# Patient Record
Sex: Male | Born: 1935
Health system: Southern US, Community
[De-identification: ages and names within clinical notes are randomized; demographics above are authoritative.]

## PROBLEM LIST (undated history)

## (undated) DIAGNOSIS — R7301 Impaired fasting glucose: Secondary | ICD-10-CM

## (undated) DIAGNOSIS — C4490 Unspecified malignant neoplasm of skin, unspecified: Secondary | ICD-10-CM

## (undated) DIAGNOSIS — N3010 Interstitial cystitis (chronic) without hematuria: Secondary | ICD-10-CM

## (undated) DIAGNOSIS — K449 Diaphragmatic hernia without obstruction or gangrene: Secondary | ICD-10-CM

## (undated) DIAGNOSIS — N200 Calculus of kidney: Secondary | ICD-10-CM

## (undated) DIAGNOSIS — K635 Polyp of colon: Secondary | ICD-10-CM

## (undated) DIAGNOSIS — N400 Enlarged prostate without lower urinary tract symptoms: Secondary | ICD-10-CM

## (undated) DIAGNOSIS — E785 Hyperlipidemia, unspecified: Secondary | ICD-10-CM

## (undated) HISTORY — PX: SEPTOPLASTY: SUR1290

## (undated) HISTORY — DX: Impaired fasting glucose: R73.01

## (undated) HISTORY — DX: Polyp of colon: K63.5

## (undated) HISTORY — DX: Calculus of kidney: N20.0

## (undated) HISTORY — PX: COLONOSCOPY W/ POLYPECTOMY: SHX1380

## (undated) HISTORY — PX: CYSTOSCOPY: SUR368

## (undated) HISTORY — PX: TONSILLECTOMY: SUR1361

## (undated) HISTORY — DX: Interstitial cystitis (chronic) without hematuria: N30.10

## (undated) HISTORY — DX: Unspecified malignant neoplasm of skin, unspecified: C44.90

## (undated) HISTORY — DX: Diaphragmatic hernia without obstruction or gangrene: K44.9

## (undated) HISTORY — DX: Hyperlipidemia, unspecified: E78.5

## (undated) HISTORY — DX: Benign prostatic hyperplasia without lower urinary tract symptoms: N40.0

---

## 2001-12-16 ENCOUNTER — Ambulatory Visit (HOSPITAL_COMMUNITY): Admission: RE | Admit: 2001-12-16 | Discharge: 2001-12-16 | Payer: Self-pay | Admitting: Gastroenterology

## 2002-09-17 ENCOUNTER — Encounter: Payer: Self-pay | Admitting: Internal Medicine

## 2002-09-17 ENCOUNTER — Encounter: Admission: RE | Admit: 2002-09-17 | Discharge: 2002-09-17 | Payer: Self-pay | Admitting: Internal Medicine

## 2003-08-19 ENCOUNTER — Encounter (INDEPENDENT_AMBULATORY_CARE_PROVIDER_SITE_OTHER): Payer: Self-pay | Admitting: Specialist

## 2003-08-19 ENCOUNTER — Ambulatory Visit (HOSPITAL_COMMUNITY): Admission: RE | Admit: 2003-08-19 | Discharge: 2003-08-19 | Payer: Self-pay | Admitting: Urology

## 2003-08-19 ENCOUNTER — Ambulatory Visit (HOSPITAL_BASED_OUTPATIENT_CLINIC_OR_DEPARTMENT_OTHER): Admission: RE | Admit: 2003-08-19 | Discharge: 2003-08-19 | Payer: Self-pay | Admitting: Urology

## 2004-09-21 ENCOUNTER — Ambulatory Visit: Payer: Self-pay | Admitting: Internal Medicine

## 2004-09-26 ENCOUNTER — Ambulatory Visit: Payer: Self-pay | Admitting: Internal Medicine

## 2005-09-26 ENCOUNTER — Ambulatory Visit: Payer: Self-pay | Admitting: Internal Medicine

## 2005-09-30 ENCOUNTER — Ambulatory Visit: Payer: Self-pay | Admitting: Internal Medicine

## 2006-08-26 ENCOUNTER — Ambulatory Visit: Payer: Self-pay | Admitting: Internal Medicine

## 2006-11-26 ENCOUNTER — Ambulatory Visit: Payer: Self-pay | Admitting: Internal Medicine

## 2006-12-02 ENCOUNTER — Ambulatory Visit: Payer: Self-pay | Admitting: Internal Medicine

## 2007-01-26 ENCOUNTER — Ambulatory Visit: Payer: Self-pay | Admitting: Internal Medicine

## 2007-04-02 ENCOUNTER — Emergency Department (HOSPITAL_COMMUNITY): Admission: EM | Admit: 2007-04-02 | Discharge: 2007-04-02 | Payer: Self-pay | Admitting: Emergency Medicine

## 2007-04-07 ENCOUNTER — Encounter: Payer: Self-pay | Admitting: Internal Medicine

## 2007-04-07 ENCOUNTER — Ambulatory Visit: Payer: Self-pay | Admitting: Internal Medicine

## 2007-04-07 LAB — CONVERTED CEMR LAB
Free T4: 0.7 ng/dL (ref 0.6–1.6)
Magnesium: 2.5 mg/dL (ref 1.5–2.5)
T3, Free: 2.9 pg/mL (ref 2.3–4.2)
TSH: 1.18 microintl units/mL (ref 0.35–5.50)

## 2007-04-08 ENCOUNTER — Encounter: Payer: Self-pay | Admitting: Internal Medicine

## 2007-04-13 ENCOUNTER — Telehealth: Payer: Self-pay | Admitting: Internal Medicine

## 2007-05-06 ENCOUNTER — Ambulatory Visit: Payer: Self-pay | Admitting: Internal Medicine

## 2007-05-20 ENCOUNTER — Telehealth (INDEPENDENT_AMBULATORY_CARE_PROVIDER_SITE_OTHER): Payer: Self-pay | Admitting: *Deleted

## 2007-05-25 ENCOUNTER — Encounter (INDEPENDENT_AMBULATORY_CARE_PROVIDER_SITE_OTHER): Payer: Self-pay | Admitting: *Deleted

## 2007-06-03 ENCOUNTER — Ambulatory Visit: Payer: Self-pay | Admitting: Internal Medicine

## 2007-06-05 LAB — CONVERTED CEMR LAB
Calcium: 9.9 mg/dL (ref 8.4–10.5)
Potassium: 4.6 meq/L (ref 3.5–5.1)

## 2007-06-08 ENCOUNTER — Encounter (INDEPENDENT_AMBULATORY_CARE_PROVIDER_SITE_OTHER): Payer: Self-pay | Admitting: *Deleted

## 2007-06-19 ENCOUNTER — Ambulatory Visit: Payer: Self-pay | Admitting: Cardiology

## 2007-06-22 ENCOUNTER — Encounter: Payer: Self-pay | Admitting: Internal Medicine

## 2007-06-23 ENCOUNTER — Encounter: Payer: Self-pay | Admitting: Cardiology

## 2007-06-23 ENCOUNTER — Ambulatory Visit: Payer: Self-pay

## 2007-08-04 ENCOUNTER — Encounter (INDEPENDENT_AMBULATORY_CARE_PROVIDER_SITE_OTHER): Payer: Self-pay | Admitting: Urology

## 2007-08-04 ENCOUNTER — Ambulatory Visit (HOSPITAL_BASED_OUTPATIENT_CLINIC_OR_DEPARTMENT_OTHER): Admission: RE | Admit: 2007-08-04 | Discharge: 2007-08-05 | Payer: Self-pay | Admitting: Urology

## 2007-10-13 ENCOUNTER — Encounter: Payer: Self-pay | Admitting: Internal Medicine

## 2007-10-14 ENCOUNTER — Encounter: Payer: Self-pay | Admitting: Internal Medicine

## 2007-10-22 ENCOUNTER — Encounter: Payer: Self-pay | Admitting: Internal Medicine

## 2007-12-07 ENCOUNTER — Encounter: Payer: Self-pay | Admitting: Internal Medicine

## 2008-01-04 ENCOUNTER — Telehealth: Payer: Self-pay | Admitting: Internal Medicine

## 2008-02-01 ENCOUNTER — Encounter: Payer: Self-pay | Admitting: Internal Medicine

## 2008-02-16 ENCOUNTER — Encounter: Payer: Self-pay | Admitting: Internal Medicine

## 2008-02-18 ENCOUNTER — Ambulatory Visit: Payer: Self-pay | Admitting: Internal Medicine

## 2008-02-18 DIAGNOSIS — R930 Abnormal findings on diagnostic imaging of skull and head, not elsewhere classified: Secondary | ICD-10-CM | POA: Insufficient documentation

## 2008-02-18 DIAGNOSIS — K219 Gastro-esophageal reflux disease without esophagitis: Secondary | ICD-10-CM | POA: Insufficient documentation

## 2008-02-18 DIAGNOSIS — E785 Hyperlipidemia, unspecified: Secondary | ICD-10-CM | POA: Insufficient documentation

## 2008-02-18 DIAGNOSIS — N3010 Interstitial cystitis (chronic) without hematuria: Secondary | ICD-10-CM | POA: Insufficient documentation

## 2008-02-22 ENCOUNTER — Encounter (INDEPENDENT_AMBULATORY_CARE_PROVIDER_SITE_OTHER): Payer: Self-pay | Admitting: *Deleted

## 2008-02-24 ENCOUNTER — Encounter: Payer: Self-pay | Admitting: Internal Medicine

## 2008-05-12 ENCOUNTER — Ambulatory Visit: Payer: Self-pay | Admitting: Internal Medicine

## 2008-05-16 ENCOUNTER — Encounter (INDEPENDENT_AMBULATORY_CARE_PROVIDER_SITE_OTHER): Payer: Self-pay | Admitting: *Deleted

## 2008-06-10 ENCOUNTER — Ambulatory Visit (HOSPITAL_COMMUNITY): Admission: RE | Admit: 2008-06-10 | Discharge: 2008-06-10 | Payer: Self-pay | Admitting: Gastroenterology

## 2008-07-04 ENCOUNTER — Encounter: Payer: Self-pay | Admitting: Internal Medicine

## 2008-07-28 ENCOUNTER — Encounter: Payer: Self-pay | Admitting: Internal Medicine

## 2008-08-04 ENCOUNTER — Ambulatory Visit: Payer: Self-pay | Admitting: Internal Medicine

## 2008-08-22 ENCOUNTER — Encounter: Payer: Self-pay | Admitting: Internal Medicine

## 2008-12-05 ENCOUNTER — Ambulatory Visit: Payer: Self-pay | Admitting: Internal Medicine

## 2008-12-05 DIAGNOSIS — K449 Diaphragmatic hernia without obstruction or gangrene: Secondary | ICD-10-CM | POA: Insufficient documentation

## 2008-12-05 DIAGNOSIS — Z86010 Personal history of colonic polyps: Secondary | ICD-10-CM

## 2008-12-06 ENCOUNTER — Encounter: Payer: Self-pay | Admitting: Internal Medicine

## 2008-12-26 ENCOUNTER — Telehealth (INDEPENDENT_AMBULATORY_CARE_PROVIDER_SITE_OTHER): Payer: Self-pay | Admitting: *Deleted

## 2009-03-24 ENCOUNTER — Ambulatory Visit: Payer: Self-pay | Admitting: Internal Medicine

## 2009-04-03 ENCOUNTER — Telehealth (INDEPENDENT_AMBULATORY_CARE_PROVIDER_SITE_OTHER): Payer: Self-pay | Admitting: *Deleted

## 2009-04-12 ENCOUNTER — Ambulatory Visit: Payer: Self-pay | Admitting: Internal Medicine

## 2009-04-13 ENCOUNTER — Ambulatory Visit: Payer: Self-pay | Admitting: Internal Medicine

## 2009-04-14 ENCOUNTER — Encounter (INDEPENDENT_AMBULATORY_CARE_PROVIDER_SITE_OTHER): Payer: Self-pay | Admitting: *Deleted

## 2009-09-15 ENCOUNTER — Ambulatory Visit: Payer: Self-pay | Admitting: Internal Medicine

## 2009-09-15 DIAGNOSIS — G252 Other specified forms of tremor: Secondary | ICD-10-CM

## 2009-09-15 DIAGNOSIS — G250 Essential tremor: Secondary | ICD-10-CM | POA: Insufficient documentation

## 2009-09-15 DIAGNOSIS — N400 Enlarged prostate without lower urinary tract symptoms: Secondary | ICD-10-CM

## 2009-09-18 ENCOUNTER — Encounter (INDEPENDENT_AMBULATORY_CARE_PROVIDER_SITE_OTHER): Payer: Self-pay | Admitting: *Deleted

## 2009-09-26 ENCOUNTER — Encounter (INDEPENDENT_AMBULATORY_CARE_PROVIDER_SITE_OTHER): Payer: Self-pay | Admitting: *Deleted

## 2009-09-26 ENCOUNTER — Ambulatory Visit: Payer: Self-pay | Admitting: Internal Medicine

## 2009-09-26 LAB — CONVERTED CEMR LAB: OCCULT 3: NEGATIVE

## 2009-11-11 DIAGNOSIS — K635 Polyp of colon: Secondary | ICD-10-CM

## 2009-11-11 HISTORY — DX: Polyp of colon: K63.5

## 2010-10-11 ENCOUNTER — Encounter: Payer: Self-pay | Admitting: Internal Medicine

## 2010-10-11 ENCOUNTER — Ambulatory Visit: Payer: Self-pay | Admitting: Internal Medicine

## 2010-10-11 DIAGNOSIS — R7303 Prediabetes: Secondary | ICD-10-CM

## 2010-10-11 DIAGNOSIS — Z85828 Personal history of other malignant neoplasm of skin: Secondary | ICD-10-CM

## 2010-10-14 DIAGNOSIS — Z87442 Personal history of urinary calculi: Secondary | ICD-10-CM

## 2010-10-15 LAB — CONVERTED CEMR LAB
BUN: 17 mg/dL (ref 6–23)
Basophils Absolute: 0 10*3/uL (ref 0.0–0.1)
Chloride: 100 meq/L (ref 96–112)
Cholesterol: 182 mg/dL (ref 0–200)
Creatinine, Ser: 1.1 mg/dL (ref 0.4–1.5)
Eosinophils Absolute: 0.1 10*3/uL (ref 0.0–0.7)
GFR calc non Af Amer: 71.72 mL/min (ref >60)
Glucose, Bld: 106 mg/dL — ABNORMAL HIGH (ref 70–99)
HCT: 47.8 % (ref 39.0–52.0)
Hgb A1c MFr Bld: 5.9 % (ref 4.6–6.5)
Lymphs Abs: 3.1 10*3/uL (ref 0.7–4.0)
MCV: 90 fL (ref 78.0–100.0)
Monocytes Absolute: 0.7 10*3/uL (ref 0.1–1.0)
Platelets: 197 10*3/uL (ref 150.0–400.0)
Potassium: 5.2 meq/L — ABNORMAL HIGH (ref 3.5–5.1)
RDW: 13.4 % (ref 11.5–14.6)
TSH: 0.92 microintl units/mL (ref 0.35–5.50)
Total Bilirubin: 1 mg/dL (ref 0.3–1.2)
Triglycerides: 101 mg/dL (ref 0.0–149.0)
VLDL: 20.2 mg/dL (ref 0.0–40.0)

## 2010-11-02 ENCOUNTER — Ambulatory Visit: Payer: Self-pay | Admitting: Internal Medicine

## 2010-11-02 DIAGNOSIS — R1032 Left lower quadrant pain: Secondary | ICD-10-CM

## 2010-11-02 DIAGNOSIS — K5900 Constipation, unspecified: Secondary | ICD-10-CM | POA: Insufficient documentation

## 2010-11-06 LAB — CONVERTED CEMR LAB
Basophils Absolute: 0.1 10*3/uL (ref 0.0–0.1)
Eosinophils Absolute: 0.1 10*3/uL (ref 0.0–0.7)
Hemoglobin: 16.4 g/dL (ref 13.0–17.0)
Lymphocytes Relative: 36.8 % (ref 12.0–46.0)
Lymphs Abs: 3.6 10*3/uL (ref 0.7–4.0)
MCHC: 34.4 g/dL (ref 30.0–36.0)
Neutro Abs: 5.3 10*3/uL (ref 1.4–7.7)
RDW: 12.8 % (ref 11.5–14.6)

## 2010-11-11 DIAGNOSIS — K449 Diaphragmatic hernia without obstruction or gangrene: Secondary | ICD-10-CM

## 2010-11-11 HISTORY — PX: UPPER GASTROINTESTINAL ENDOSCOPY: SHX188

## 2010-11-11 HISTORY — DX: Diaphragmatic hernia without obstruction or gangrene: K44.9

## 2010-12-09 LAB — CONVERTED CEMR LAB
ALT: 28 units/L (ref 0–53)
ALT: 29 units/L (ref 0–53)
AST: 26 units/L (ref 0–37)
Albumin: 4.7 g/dL (ref 3.5–5.2)
Alkaline Phosphatase: 64 units/L (ref 39–117)
Alkaline Phosphatase: 70 units/L (ref 39–117)
BUN: 16 mg/dL (ref 6–23)
BUN: 16 mg/dL (ref 6–23)
Basophils Absolute: 0 10*3/uL (ref 0.0–0.1)
Basophils Relative: 0 % (ref 0.0–3.0)
Basophils Relative: 0.1 % (ref 0.0–1.0)
Bilirubin, Direct: 0.1 mg/dL (ref 0.0–0.3)
Bilirubin, Direct: 0.2 mg/dL (ref 0.0–0.3)
CO2: 32 meq/L (ref 19–32)
Calcium: 10 mg/dL (ref 8.4–10.5)
Calcium: 9.7 mg/dL (ref 8.4–10.5)
Chloride: 104 meq/L (ref 96–112)
Cholesterol: 146 mg/dL (ref 0–200)
Cholesterol: 173 mg/dL (ref 0–200)
Creatinine, Ser: 0.9 mg/dL (ref 0.4–1.5)
Creatinine, Ser: 1.1 mg/dL (ref 0.4–1.5)
Eosinophils Absolute: 0.1 10*3/uL (ref 0.0–0.7)
Eosinophils Relative: 1 % (ref 0.0–5.0)
Eosinophils Relative: 1.3 % (ref 0.0–5.0)
GFR calc Af Amer: 107 mL/min
GFR calc non Af Amer: 69.67 mL/min (ref >60)
GFR calc non Af Amer: 88 mL/min
Glucose, Bld: 99 mg/dL (ref 70–99)
HCT: 52.7 % — ABNORMAL HIGH (ref 39.0–52.0)
HDL: 39 mg/dL (ref >39.0)
HDL: 40.1 mg/dL (ref >39.00)
Hemoglobin: 17.1 g/dL — ABNORMAL HIGH (ref 13.0–17.0)
Hgb A1c MFr Bld: 5.7 % (ref 4.6–6.0)
Hgb A1c MFr Bld: 5.8 % (ref 4.6–6.5)
LDL Cholesterol: 108 mg/dL — ABNORMAL HIGH (ref 0–99)
LDL Cholesterol: 88 mg/dL (ref 0–99)
Lymphocytes Relative: 37.5 % (ref 12.0–46.0)
Lymphocytes Relative: 37.9 % (ref 12.0–46.0)
MCHC: 32.5 g/dL (ref 30.0–36.0)
MCV: 88.3 fL (ref 78.0–100.0)
MCV: 91.7 fL (ref 78.0–100.0)
Monocytes Absolute: 0.5 10*3/uL (ref 0.1–1.0)
Monocytes Absolute: 0.6 10*3/uL (ref 0.1–1.0)
Monocytes Relative: 7.2 % (ref 3.0–12.0)
Monocytes Relative: 7.9 % (ref 3.0–12.0)
Neutro Abs: 4.4 10*3/uL (ref 1.4–7.7)
Neutrophils Relative %: 53.5 % (ref 43.0–77.0)
Neutrophils Relative %: 53.6 % (ref 43.0–77.0)
Platelets: 183 10*3/uL (ref 150.0–400.0)
Platelets: 220 10*3/uL (ref 150–400)
Potassium: 5 meq/L (ref 3.5–5.1)
RBC: 5.21 M/uL (ref 4.22–5.81)
RBC: 5.96 M/uL — ABNORMAL HIGH (ref 4.22–5.81)
RDW: 12.8 % (ref 11.5–14.6)
Sodium: 143 meq/L (ref 135–145)
TSH: 0.93 microintl units/mL (ref 0.35–5.50)
Total Bilirubin: 1.1 mg/dL (ref 0.3–1.2)
Total Bilirubin: 1.2 mg/dL (ref 0.3–1.2)
Total CHOL/HDL Ratio: 3.7
Total CHOL/HDL Ratio: 4
Total Protein: 7.6 g/dL (ref 6.0–8.3)
Triglycerides: 127 mg/dL (ref 0.0–149.0)
Triglycerides: 97 mg/dL (ref 0–149)
VLDL: 19 mg/dL (ref 0–40)
VLDL: 25.4 mg/dL (ref 0.0–40.0)
WBC: 7.5 10*3/uL (ref 4.5–10.5)
WBC: 8.1 10*3/uL (ref 4.5–10.5)

## 2010-12-11 NOTE — Assessment & Plan Note (Signed)
Summary: CPX/FASTING//KN   Vital Signs:  Patient profile:   77 year old male Height:      69.5 inches Weight:      177.6 pounds BMI:     25.94 Temp:     97.7 degrees F oral Pulse rate:   52 / minute Resp:     14 per minute BP sitting:   148 / 86  (left arm) Cuff size:   large  Vitals Entered By: Shonna Chock CMA (October 11, 2010 8:27 AM) CC: CPX with fasting labs , Heartburn   CC:  CPX with fasting labs  and Heartburn.  History of Present Illness: Here for Medicare AWV: 1.Risk factors based on Past M, S, F history:GERD; Interstitial Cystitis; Diverticulosis; Allergic Rhinitis(chart updated) 2.Physical Activities: walking once daily 30-60 minutes w/o symptoms 3.Depression/mood: no issues 4.Hearing: whisper heard @ 6 ft 5.ADL's: no limitations 6.Fall Risk: none 7.Home Safety:no issues  8.Height, weight, &visual acuity:wall chart read @ 6 ft 9. POA & Living Will in placeCounseling:  10.Labs ordered based on risk factors: see Orders 11. Referral Coordination: in place 12. Care Plan: see Instructions 13.Cognitive Assessment: mood & affect normal;; memory & recall intact ; "World " spelled backwards; Oriented X 3. GERD      This is a 77 year old man who has a history of GERD.  The patient reports  occasional acid reflux and weight gain of 3-4 # (post cruise), but denies sour taste in mouth, epigastric pain, chest pain, and trouble swallowing.  The patient denies the following alarm features: melena, dysphagia, hematemesis, and vomiting.  Symptoms are worse with spicy foods and citrus.  Prior evaluation has included EGD.  The patient has found the following treatments to be effective: a PPI.    Preventive Screening-Counseling & Management  Alcohol-Tobacco     Alcohol drinks/day: very rarely     Smoking Status: quit > 6 months     Packs/Day: < 10/day     Year Started: 1941     Year Quit: 1953  Caffeine-Diet-Exercise     Caffeine use/day: decaf 2 cups     Diet Comments: no  specific diet  Hep-HIV-STD-Contraception     Dental Visit-last 6 months yes     Sun Exposure-Excessive: no  Safety-Violence-Falls     Seat Belt Use: yes      Blood Transfusions:  no.        Travel History:  [place] 09/2010.    Current Medications (verified): 1)  Fexofenadine Hcl 180 Mg Tabs (Fexofenadine Hcl) .... Take 1 Tab Once Daily As Needed 2)  Prilosec Otc 20 Mg Tbec (Omeprazole Magnesium) .Marland Kitchen.. 1 Two Times A Day Pre Meals 3)  Fluticasone Propionate 50 Mcg/act Susp (Fluticasone Propionate) .Marland Kitchen.. 1 Spray Two Times A Day As Needed  Allergies: 1)  ! Flomax (Tamsulosin Hcl) 2)  Pcn 3)  Sulfa  Past History:  Past Medical History: Interstitial Cystitis , presentation in 1997 with abdominal  pain/ distention, [facility], [facility] Colonic polyps, PMH of, Dr Dorena Cookey GERD Benign prostatic hypertrophy Skin cancer, PMH  of, Basal Cell , Dr Karlyn Agee Hyperlipidemia: LDL 105 , HDL 38.5 in 2005. NMR Lipoprofie 2007: LDL 94(1220/875), HDL 38, TG 49.LDL goal = < 105. Fasting hyperglycemia Nephrolithiasis, PMH  of X 2  Past Surgical History: Cystoscopy 2004 & 2011; Hospitalized 1997 with [facility] Colon polypectomy  & Esophageal dilation  01/2008 by Dr Madilyn Fireman ; bladder stones S/P urethral dilation 10/2007 by Dr Logan Bores Tonsillectomy;  Septoplasty  Family History: Father: CVA, HTN, CAD  Mother: CVA,HTN; bro: negative ; PGM: ? cancer   Social History: heart healthy diet Retired, travels extensively Married Former Smoker: quit 1953 Alcohol use-yes:very rarely Regular exercise-yesSmoking Status:  quit > 6 months Caffeine use/day:  decaf 2 cups Dental Care w/in 6 mos.:  yes Sun Exposure-Excessive:  no Seat Belt Use:  yes Blood Transfusions:  no Packs/Day:  < 10/day  Review of Systems  The patient denies anorexia, fever, vision loss, decreased hearing, hoarseness, syncope, dyspnea on exertion, peripheral edema, prolonged cough, hemoptysis, hematuria, suspicious skin lesions,  depression, unusual weight change, abnormal bleeding, enlarged lymph nodes, and angioedema.    Physical Exam  General:  well-nourished, appears younger than age;alert,appropriate and cooperative throughout examination Head:  Normocephalic and atraumatic without obvious abnormalities. Pattern  alopecia. Eyes:  No corneal or conjunctival inflammation noted.Arcus senilis.Perrla. Funduscopic exam benign, without hemorrhages, exudates or papilledema.  Ears:  External ear exam shows no significant lesions or deformities.  Otoscopic examination reveals clear canals, tympanic membranes are intact bilaterally without bulging, retraction, inflammation or discharge. Hearing is grossly normal bilaterally. Nose:  External nasal examination shows no deformity or inflammation. Nasal mucosa are pink and moist without lesions or exudates. Dislocation & deviation Mouth:  Oral mucosa and oropharynx without lesions or exudates.  Teeth in good repair. Asymmetry of pharynx . No pharyngeal erythema.   Neck:  No deformities, masses, or tenderness noted. Chest Wall:  barrel-chest  ( superby conditioned) Lungs:  Normal respiratory effort, chest expands symmetrically. Lungs are clear to auscultation, no crackles or wheezes. Heart:  regular rhythm, no murmur, no gallop, no rub, no JVD, no HJR, and bradycardia.   Abdomen:  Bowel sounds positive,abdomen soft and non-tender without masses, organomegaly or hernias noted. Genitalia:  Dr Logan Bores did cysto 07/2010 Msk:  No deformity or scoliosis noted of thoracic or lumbar spine.   Pulses:  R and L carotid,radial,dorsalis pedis and posterior tibial pulses are full and equal bilaterally Extremities:  No clubbing, cyanosis, edema, or deformity noted with normal full range of motion of all joints.   Neurologic:  alert & oriented X3 and DTRs symmetrical and normal.   Skin:  Intact without suspicious lesions or rashes Cervical Nodes:  No lymphadenopathy noted Axillary Nodes:  No  palpable lymphadenopathy Psych:  memory intact for recent and remote, normally interactive, and not anxious appearing.     Impression & Recommendations:  Problem # 1:  PREVENTIVE HEALTH CARE (ICD-V70.0)  Orders: [facility] -Subsequent Annual Wellness Visit [phone])  Problem # 2:  GERD (ICD-530.81)  Orders: Venipuncture (91478) TLB-CBC Platelet - w/Differential (85025-CBCD)  Problem # 3:  HYPERLIPIDEMIA, MILD (ICD-272.4)  Orders: EKG w/ Interpretation (93000) Venipuncture (29562) Specimen Handling (13086) TLB-Lipid Panel (80061-LIPID) TLB-BMP (Basic Metabolic Panel-BMET) (80048-METABOL) TLB-Hepatic/Liver Function Pnl (80076-HEPATIC) TLB-TSH (Thyroid Stimulating Hormone) (84443-TSH)  Problem # 4:  CHEST XRAY, ABNORMAL (ICD-793.1) Remote granulomatous changes  Problem # 5:  HYPERGLYCEMIA, FASTING (ICD-790.29)  PMH of  Orders: Venipuncture (57846) Specimen Handling (96295) TLB-BMP (Basic Metabolic Panel-BMET) (80048-METABOL) TLB-A1C / Hgb A1C (Glycohemoglobin) (83036-A1C)  Problem # 6:  INTERSTITIAL CYSTITIS (ICD-595.1) as per Dr Logan Bores  Complete Medication List: 1)  Fexofenadine Hcl 180 Mg Tabs (Fexofenadine hcl) .... Take 1 tab once daily as needed 2)  Omeprazole 20 Mg  .Marland Kitchen.. 1 once daily 3)  Fluticasone Propionate 50 Mcg/act Susp (Fluticasone propionate) .Marland Kitchen.. 1 spray two times a day as needed  Other Orders: Tdap => 43yrs IM (28413) Admin 1st Vaccine (24401)  Patient Instructions: 1)  Complete stool cards. For GERD flares take Omeprazole two times a day pre meals as needed . 2)  Avoid foods high in acid (tomatoes, citrus juices, spicy foods). Avoid eating within two hours of lying down or before exercising. Do not over eat; try smaller more frequent meals. Elevate head of bed twelve inches when sleeping. 3)  Check your Blood Pressure regularly. If it is above:135/85 on average you should make an appointment. Prescriptions: OMEPRAZOLE  20 MG 1 once daily  #90 x 3   Entered  and Authorized by:   Marga Melnick MD   Signed by:   Marga Melnick MD on 10/11/2010   Method used:   Print then Give to Patient   RxID:   [id number] FLUTICASONE PROPIONATE 50 MCG/ACT SUSP (FLUTICASONE PROPIONATE) 1 spray two times a day as needed  #1 x 11   Entered and Authorized by:   Marga Melnick MD   Signed by:   Marga Melnick MD on 10/11/2010   Method used:   Print then Give to Patient   RxID:   [id number] FEXOFENADINE HCL 180 MG TABS (FEXOFENADINE HCL) take 1 tab once daily as needed  #30 Tablet x 11   Entered and Authorized by:   Marga Melnick MD   Signed by:   Marga Melnick MD on 10/11/2010   Method used:   Print then Give to Patient   RxID:   [id number]    Orders Added: 1)  Tdap => 46yrs IM [90715] 2)  Admin 1st Vaccine [90471] 3)  MC -Subsequent Annual Wellness Visit [G0439] 4)  Est. Patient Level III [47425] 5)  EKG w/ Interpretation [93000] 6)  Venipuncture [95638] 7)  Specimen Handling [99000] 8)  TLB-Lipid Panel [80061-LIPID] 9)  TLB-BMP (Basic Metabolic Panel-BMET) [80048-METABOL] 10)  TLB-CBC Platelet - w/Differential [85025-CBCD] 11)  TLB-Hepatic/Liver Function Pnl [80076-HEPATIC] 12)  TLB-TSH (Thyroid Stimulating Hormone) [84443-TSH] 13)  TLB-A1C / Hgb A1C (Glycohemoglobin) [83036-A1C]   Immunization History:  Pneumovax Immunization History:    Pneumovax:  historical (09/16/2002)  Immunizations Administered:  Tetanus Vaccine:    Vaccine Type: Tdap    Site: right deltoid    Mfr: GlaxoSmithKline    Dose: 0.5 ml    Route: IM    Given by: Shonna Chock CMA    Exp. Date: 08/30/2012    Lot #: [id number]    VIS given: 09/28/08 version given October 11, 2010.   Immunization History:  Pneumovax Immunization History:    Pneumovax:  Historical (09/16/2002)  Immunizations Administered:  Tetanus Vaccine:    Vaccine Type: Tdap    Site: right deltoid    Mfr: GlaxoSmithKline    Dose: 0.5 ml    Route: IM    Given by: Shonna Chock CMA    Exp. Date: 08/30/2012    Lot #: [id number]    VIS given: 09/28/08 version given October 11, 2010.

## 2010-12-13 NOTE — Assessment & Plan Note (Signed)
Summary: change in bowels/stomach pain//kn   Vital Signs:  Patient profile:   77 year old male Height:      69.5 inches (176.53 cm) Weight:      175.25 pounds (79.66 kg) BMI:     25.60 Temp:     98.5 degrees F (36.94 degrees C) oral Resp:     15 per minute BP sitting:   140 / 90  (left arm) Cuff size:   regular  Vitals Entered By: Lucious Groves CMA (November 02, 2010 1:08 PM) CC: C/O change in bowels./kb, Abdominal pain Is Patient Diabetic? No Pain Assessment Patient in pain? no      Comments Patient notes that the stools are brown and he is having pain with BM's. Patient denies mucous/blood in the stool, rectal bleeding, fever, and black stools. He does not having constipation and dry mouth and OTC products have not helped him.    CC:  C/O change in bowels./kb and Abdominal pain.  History of Present Illness:      This is a 77 year old man who presents with Abdominal pain intermittently over 2 weeks. His initial symptom was constipation.  The patient reports nausea in am, but denies vomiting, diarrhea, melena, hematochezia, anorexia, and hematemesis.  The location of the pain is left lower quadrant.  The pain is described as intermittent and sharp & worse with hard stool.  This is followed by chilling. The patient denies the following symptoms: fever,   weight loss, pyuria, hematuria,dysuria, jaundice, and dark urine.  No voiding issues. The pain is  better with defecation of normal BM. Stools "pencil thin"  ,hard  X 2 weeks. Last colonoscopy 2009. Rx: Ducolax,fiber, stool softeners, fluids, enemas. TSH was  0.92 this month.  Current Medications (verified): 1)  Fexofenadine Hcl 180 Mg Tabs (Fexofenadine Hcl) .... Take 1 Tab Once Daily As Needed 2)  Omeprazole  20 Mg .Marland Kitchen.. 1 Once Daily 3)  Fluticasone Propionate 50 Mcg/act Susp (Fluticasone Propionate) .Marland Kitchen.. 1 Spray Two Times A Day As Needed  Allergies (verified): 1)  ! Flomax (Tamsulosin Hcl) 2)  Pcn 3)  Sulfa  Physical  Exam  General:  well-nourished,in no acute distress; alert,appropriate and cooperative throughout examination Eyes:  No corneal or conjunctival inflammation notedNo icterus Mouth:  Oral mucosa and oropharynx without lesions or exudates.  Teeth in good repair. no pharyngeal erythema.  Tongue moist Neck:  No deformities, masses, or tenderness noted. Lungs:  Normal respiratory effort, chest expands symmetrically. Lungs are clear to auscultation, no crackles or wheezes. Heart:  Normal rate and regular rhythm. S1 and S2 normal without gallop, murmur, click, rub or other extra sounds. Abdomen:  Bowel sounds positive,abdomen soft  tender LLQ tender without masses, organomegaly or hernias noted. Rectal:  No external abnormalities noted. Normal sphincter tone. No rectal masses or tenderness. No impaction Prostate:  no nodules, no asymmetry, no induration, and 1+ enlarged.   Skin:  Intact without suspicious lesions or rashes. No jaundice Cervical Nodes:  No lymphadenopathy noted Axillary Nodes:  No palpable lymphadenopathy   Impression & Recommendations:  Problem # 1:  CONSTIPATION (ICD-564.00)  Problem # 2:  ABDOMINAL PAIN, LEFT LOWER QUADRANT (ICD-789.04)  Orders: Venipuncture (16109) TLB-CBC Platelet - w/Differential (85025-CBCD)  Complete Medication List: 1)  Fexofenadine Hcl 180 Mg Tabs (Fexofenadine hcl) .... Take 1 tab once daily as needed 2)  Omeprazole 20 Mg  .Marland Kitchen.. 1 once daily 3)  Fluticasone Propionate 50 Mcg/act Susp (Fluticasone propionate) .Marland Kitchen.. 1 spray two times a day  as needed 4)  Oscimin 0.125 Mg Subl (Hyoscyamine sulfate) .Marland Kitchen.. 1 under tongue every 6 hrs as needed pain  Patient Instructions: 1)  Miralax every 3 rd day as needed. Align ( Probiotic ) once daily until  bowels are normal. 2)  Drink as much fluid as you can tolerate for the next few days. 3)  You need to have a Pap Smear to prevent cervical cancer. Prescriptions: OSCIMIN 0.125 MG SUBL (HYOSCYAMINE SULFATE) 1  under tongue every 6 hrs as needed pain  #30 x 0   Entered and Authorized by:   Marga Melnick MD   Signed by:   Marga Melnick MD on 11/02/2010   Method used:   Faxed to ...       [id number] (retail)       [address]       [place], [place]  [address]       Ph: [phone] or [phone]       Fax: [phone]   RxID:   [id number]    Orders Added: 1)  Est. Patient Level IV [36644] 2)  Venipuncture [03474] 3)  TLB-CBC Platelet - w/Differential [85025-CBCD]  Appended Document: change in bowels/stomach pain//kn

## 2010-12-18 ENCOUNTER — Encounter: Payer: Self-pay | Admitting: Internal Medicine

## 2010-12-18 ENCOUNTER — Ambulatory Visit (INDEPENDENT_AMBULATORY_CARE_PROVIDER_SITE_OTHER): Payer: MEDICARE | Admitting: Internal Medicine

## 2010-12-18 DIAGNOSIS — K5900 Constipation, unspecified: Secondary | ICD-10-CM

## 2010-12-27 NOTE — Assessment & Plan Note (Signed)
Summary: still having trouble with bowels///sph  Nurse Visit   Vital Signs:  Patient profile:   77 year old male Weight:      177.8 pounds BMI:     25.97 Temp:     97.7 degrees F oral Pulse rate:   60 / minute Resp:     15 per minute BP sitting:   148 / 86  (left arm) Cuff size:   large  Vitals Entered By: Shonna Chock CMA (December 18, 2010 9:17 AM)  CC:  Not any better c/o ongoing BM concerns  and Abdominal pain.  History of Present Illness:    Daily  "gas"  despite Hyoscyamine  as needed  with constipation every 3 days. Rx: Ducolax suppositories, stool softeners, high roughage & increased fluids. The patient reports nausea, but denies vomiting, diarrhea, melena, dysphagia,anorexia, and hematemesis.  The patient denies the following symptoms:abdomina pain, fever, weight loss, jaundice, and dark urine.  Stool is thin  when he has BM. Colonoscopy with polypectomy  was 2009; esophageal dilation performed also.TSH was 0.92 in 10/2010.    Impression & Recommendations:  Problem # 1:  CONSTIPATION (ICD-564.00)  Complete Medication List: 1)  Fexofenadine Hcl 180 Mg Tabs (Fexofenadine hcl) .... Take 1 tab once daily as needed 2)  Omeprazole 20 Mg  .Marland Kitchen.. 1 once daily 3)  Fluticasone Propionate 50 Mcg/act Susp (Fluticasone propionate) .Marland Kitchen.. 1 spray two times a day as needed   Patient Instructions: 1)  Miralax every 3rd as needed . See Dr Madilyn Fireman if constipation  & thin stool persist over 2 weeks .A copy of this note will be sent  to Dr Madilyn Fireman   Physical Exam  General:  Thin,well-nourished,in no acute distress; alert,appropriate and cooperative throughout examination Eyes:  No icterus Mouth:  Oral mucosa and oropharynx without lesions or exudates.  No pharyngeal erythema.   Neck:  No deformities, masses, or tenderness noted. Heart:  regular rhythm, no murmur, no gallop, no rub, no JVD, no HJR, and bradycardia.   Abdomen:  Bowel sounds positive,abdomen soft and non-tender without  masses, organomegaly or hernias noted. Bowel sounds heard in L chest Neurologic:  alert & oriented X3 and DTRs symmetrical and normal.   Cervical Nodes:  No lymphadenopathy noted Axillary Nodes:  No palpable lymphadenopathy  CC: Not any better c/o ongoing BM concerns , Abdominal pain   Current Medications (verified): 1)  Fexofenadine Hcl 180 Mg Tabs (Fexofenadine Hcl) .... Take 1 Tab Once Daily As Needed 2)  Omeprazole  20 Mg .Marland Kitchen.. 1 Once Daily 3)  Fluticasone Propionate 50 Mcg/act Susp (Fluticasone Propionate) .Marland Kitchen.. 1 Spray Two Times A Day As Needed  Allergies: 1)  ! Flomax (Tamsulosin Hcl) 2)  Pcn 3)  Sulfa  Orders Added: 1)  Est. Patient Level III [01027]

## 2011-03-26 NOTE — Op Note (Signed)
NAME:  Mark Copeland, Mark Copeland NO.:  [id number]   MEDICAL RECORD NO.:  [id number]          PATIENT TYPE:  AMB   LOCATION:  [facility]                         FACILITY:  [facility]   PHYSICIAN:  Jamison Neighbor, M.D.  DATE OF BIRTH:  10-14-1934   DATE OF PROCEDURE:  08/04/2007  DATE OF DISCHARGE:                               OPERATIVE REPORT   PREOPERATIVE DIAGNOSES:  1. Interstitial cystitis.  2. Bladder calculi.  3. Benign prostatic hypertrophy with bladder obstruction.   POSTOPERATIVE DIAGNOSES:  1. Interstitial cystitis.  2. Bladder calculi.  3. Benign prostatic hypertrophy with bladder obstruction.   PROCEDURES:  1. Cystoscopy.  2. Cystolithalopaxy x4.  3. Transurethral resection of the prostate.  4. Hydrodistention of the bladder.   SURGEON:  Jamison Neighbor, M.D.   ANESTHESIA:  General.   COMPLICATIONS:  None.   SPECIMENS:  1. Bladder calculi to outside lab.  2. TUR chips to pathology.   DRAIN:  A 24-French 3-way Foley catheter.   HISTORY:  This 77 year old male is known to have interstitial cystitis  as proven by previous cystoscopy and hydrodistention.  The patient has  generally responded well to Elmiron and for the past few years has  increasing improvement in his voiding symptoms.  The patient, however  has recently taken in turn for the worst and underwent a cystoscopic  evaluation the office.  He was found to have four bladder stones as well  as some evidence of bladder outlet obstruction.  The patient had been on  Flomax, which initially helped relieve his outlet symptoms but developed  worsening problems over time, suggesting that more needed to be done  other than medical therapy.  The the patient is now to undergo  cystolithalopaxy plus relief of his bladder outlet obstruction.  It is  unclear whether he will need a TUIP or a TURP.  He is also going to  undergo a simultaneous cystoscopy and hydrodistention.  He understands  the risks and benefits  of the procedure and gave full informed consent.   PROCEDURE:  After successful induction of general anesthesia, the  patient was placed in the dorsal lithotomy position, prepped with  Betadine and draped in the usual sterile fashion.  Cystoscopy was  performed.  The urethra was visualized in its entirety and was found to  be normal.  Beyond the verumontanum the patient had a high bladder neck  as well as some lateral lobe hypertrophy.  At the time of the initial  cystoscopic exam, there was actually a stone stuck somewhere within the  prostatic urethra just proximal to the veru.  With the cystoscope this  flushed back into the bladder.  The bladder was carefully inspected.  There were four bladder stones seen on the floor of the bladder.  The  bladder was moderately trabeculated.  No tumors could be identified.  Ureteral orifices were normal in configuration and location.   Cystolithalopaxy was then performed.  The holmium laser was used to  fragment the stones, which were completely pulverized, and all the stone  fragments were then flushed out with a Toomey syringe.  The  patient  clearly had enough bladder outlet obstruction to warrant intervention.  The cystoscope was removed.  The urethra was dilated up to 30-French  with Sissy Hoff sounds.  The continuous flow resectoscope sheath was then  inserted with the 30-degree lens in place.  Initially the Collings knife  was utilized.   TUIP was performed.  The bladder neck was split in the midline from just  inside the bladder neck towards the trigone, down to the verumontanum.  While this did eliminate some of the obstruction, there clearly was of a  lot of tissue stuck at approximately 5 o'clock and 7 o'clock, and it was  felt that a TUIP would not be an adequate relief of his outlet symptoms.  For that reason the patient was felt to require a TURP.   The Collings knife was removed and a standard loop was placed.  The  floor was  cleaned up, beginning at the 7 o'clock position and extending  across to 5 o'clock, all way out to the veru.  There was some lateral  lobe tissue up towards the top that was resected starting at the 11  o'clock position and extending down to the floor of the prostate.  An  identical procedure was performed on the opposite side.  There was very  little anterior lobe tissue.  There was not much out towards the apex.  Resection was taken down to the surgical capsule and out towards the  veru with no injury to the sphincter.  All the chips were irrigated from  the bladder.  Additional hemostasis was obtained with electrocautery.  Rectal exam showed there was very minimal prostatic tissue left.  Inspection showed the bladder neck had not been undermined but had been  opened up nicely.  All stones and chips had been irrigated from the  bladder.  The ureters were not injured.   After completion of the TURP the resectoscope was removed and a Foley  catheter was inserted.  This was inflated with 30 mL of water and was  placed to straight drainage.  This irrigates normally with clear  outflow.  The patient did undergo a hydrodistention prior to placement  of the Foley catheter and during the outflow his bladder capacity was  now approximately 700 mL with very little in the way of granulations.  We will tell the patient that it does appear that the Elmiron has been  helpful for him.   With the Foley catheter on continuous bladder irrigation, the patient  was taken to the recovery room in good condition.  He will stay for 23-  hour observation.      Jamison Neighbor, M.D.  Electronically Signed     RJE/MEDQ  D:  08/04/2007  T:  08/04/2007  Job:  [id number]   cc:   Dr. Alwyn Ren

## 2011-03-26 NOTE — Assessment & Plan Note (Signed)
Mark Copeland                            CARDIOLOGY OFFICE NOTE   NAME:Mark Copeland, Mark Copeland                   MRN:          [id number]  DATE:06/19/2007                            DOB:          April 27, 1934    I was asked by Dr. Marga Melnick to evaluation Mark Copeland  with palpitations.   HISTORY OF PRESENT ILLNESS:  Mark Copeland is a pleasant, very healthy  and health conscious 77 year old married white male who comes with his  wife today with the above complaint.   Dr. Alwyn Ren has evaluated this with blood work as well as an event  recorder.  The one episode called in with skipped beat and light  headedness was associated with a PVC and normal sinus rhythm at a rate  of 67 beats per minute.   No complex ectopy or other arrhythmia was noted.   His blood work, including thyroid panel, has been normal as well as a  serum magnesium, a potassium and calcium.   He has no history of cardiac disease.  He is very active and enjoys  walking and biking on a regular basis.  He has no symptoms with this.  Most of these occur in the evening or when he is sitting and resting.  He denies any syncope or presyncope.  He has had no angina or anginal  equivalents.   He stopped his Flomax hoping this would help but it did not.  He asked  me about Elmiron (a bladder relaxant) but this is not associated with it  either.   PAST MEDICAL HISTORY:  He is currently on Elmiron 100 mg x4 daily,  aspirin 81 mg daily, Flomax 0.4 mg daily, fluticasone nasal spray.   He has an intolerance to PENICILLIN AND SULFA.  There is no dye reaction  history.   He drinks very little caffeine or none.  He drinks one alcoholic  beverage a day.  He does not smoke, does not use any recreational  products.   SURGICAL HISTORY:  He has had a nasal septoplasty.   FAMILY HISTORY:  His son has WPW which was corrected with an ablation at  age 44.   SOCIAL HISTORY:  He is retired, he  was in [employer]. He is  married and has one child.   REVIEW OF SYSTEMS:  Other than the HPI is negative.   EXAMINATION:  GENERAL:  A very pleasant, looking younger than stated  age, white male who is in no acute distress.  VITAL SIGNS:  His blood pressure was 168/89 which is abnormal for him,  heart rate is 62. His EKG is completely normal, no evidence of  preexcitation.  He does have sinus bradycardia but intervals are normal.  He is 5 feet, 10 inches and weighs 170.  HEENT:  Normocephalic, atraumatic.  PERLA. Extraocular movements are  intact, sclerae clear. Facial symmetry is normal.  NECK:  Carotid upstrokes were equal bilaterally without bruits, there is  no JVD, thyroid is not enlarged. Trachea is midline.  LUNGS:  Clear.  HEART:  Reveals a normal S1, S2, there is no gallop,  rub or murmur.  ABDOMEN:  Soft, good bowel sounds, no midline bruit. No hepatomegaly.  EXTREMITIES:  Reveal no clubbing, cyanosis or edema. Pulses are intact.  NEURO EXAM:  Intact.  SKIN:  Unremarkable.   ASSESSMENT:  Benign palpitations, correlates with premature ventricular  contraction on event recorder.  His blood work is normal and there is no  history of any suggestive of ischemic heart disease or cardiac disease.   RECOMMENDATIONS:  1. Restart his Flomax.  2. Continue regular exercise.  3. A 2D echocardiogram to rule out structural heart disease.   If this is normal I would just simply reassure him.     Thomas C. Daleen Squibb, MD, [facility]  Electronically Signed    TCW/MedQ  DD: 06/19/2007  DT: 06/19/2007  Job #: [id number]   cc:   Jamison Neighbor, M.D.

## 2011-03-29 NOTE — Procedures (Signed)
[facility]  Patient:    Mark Copeland, Mark Copeland Visit Number: [id number] MRN: [id number]          Service Type: END Location: ENDO Attending Physician:  Louie Bun Dictated by:   Everardo All Madilyn Fireman, M.D. Proc. Date: 12/16/01 Admit Date:  12/16/2001   CC:         Titus Dubin. Alwyn Ren, M.D. [facility]                           Procedure Report  PROCEDURE:  Esophagogastroduodenoscopy with esophageal dilatation.  INDICATION FOR PROCEDURE:  Typical solid food dysphagia suggestive of lower esophageal ring or stricture.  DESCRIPTION OF PROCEDURE:  The patient was placed in the left lateral decubitus position and placed on the pulse monitor with continuous low-flow oxygen delivered by nasal cannula.  He was sedated with 80 mg IV Demerol and 8 mg IV Versed.  The Olympus video endoscope was advanced under direct vision into the oropharynx and esophagus.  The esophagus was straight and of normal caliber with the squamocolumnar line at 38 cm.  At this level, there was a visible circumferential lower esophageal ring with visible but not tight narrowing of the lumen at that level.  The scope easily passed beyond it without resistance.  There was about a 1 cm hiatal hernia distal to the ring. There was no visible esophagitis or other abnormality of the distal esophagus or GE junction.  The stomach was entered, and a small amount of liquid secretions were suctioned from the fundus.  Retroflexed view of the cardia was unremarkable.  The fundus, body, antrum, and pylorus all appeared normal.  The duodenum was entered, and both the bulb and second portion were well-inspected and appeared to be within normal limits.  The Savary guidewire was placed through the endoscope channel and the scope withdrawn.  A single 17 mm Savary dilator was passed over the guidewire with moderate resistance and no blood seen on withdrawal.  The patient was then prepared for screening  colonoscopy scheduled immediately after this procedure.  He tolerated the procedure well, and there were no immediate complications.  IMPRESSION:  Lower esophageal ring which had been suspected based on the symptoms, dilated to 17 mm.  PLAN:  Advance diet and observe response to dilatation. Dictated by:   Everardo All Madilyn Fireman, M.D. Attending Physician:  Louie Bun DD:  12/16/01 TD:  12/17/01 Job: [id number]

## 2011-03-29 NOTE — Procedures (Signed)
[facility]  Patient:    Mark Copeland, Mark Copeland Visit Number: [id number] MRN: [id number]          Service Type: END Location: ENDO Attending Physician:  Louie Bun Dictated by:   Everardo All Madilyn Fireman, M.D. Proc. Date: 12/16/01 Admit Date:  12/16/2001   CC:         Titus Dubin. Alwyn Ren, M.D. [facility]                           Procedure Report  PROCEDURE:  Colonoscopy.  INDICATION FOR PROCEDURE:  Screening colonoscopy in a 77 year old patient.  DESCRIPTION OF PROCEDURE:  The patient was placed in the left lateral decubitus position and placed on the pulse monitor with continuous low-flow oxygen delivered by nasal cannula.  He was sedated with 100 mg of IV Demerol and 1 mg of IV Versed in addition to the 80 mg of Demerol and 8 mg of Versed given for the previous EGD.  The Olympus video colonoscope was inserted into the rectum and advanced to the cecum, confirmed by transillumination at McBurneys point and visualization of the ileocecal valve and appendiceal orifice.  The prep was generally suboptimal throughout with particulate solid matter and nontransparent liquid stool which was difficult to suction, and in some areas I could not even rule out lesions as large as 2 cm in diameter, although other areas were fairly well-inspected.  Within these these limitations, the cecum, ascending, transverse, descending and sigmoid colon all appeared normal with no masses, polyps, diverticula, or other mucosal abnormalities.  The rectum likewise appeared normal, and retroflexed view of the anus revealed no obvious internal hemorrhoids.  The colonoscope was then withdrawn, and the patient returned to the recovery room in stable condition. He tolerated the procedure well, and there were no immediate complications.  IMPRESSION:  Normal colonoscopy with significant limitations due to poor prep.  PLAN:  Will probably plan repeat colonoscopy in four years, consider Hemoccults  sooner. Dictated by:   Everardo All Madilyn Fireman, M.D. Attending Physician:  Louie Bun DD:  12/16/01 TD:  12/17/01 Job: [id number]

## 2011-03-29 NOTE — Op Note (Signed)
NAME:  Mark Copeland, Mark Copeland                      ACCOUNT NO.:  [id number]   MEDICAL RECORD NO.:  [id number]                   PATIENT TYPE:  AMB   LOCATION:  [facility]                                 FACILITY:  [facility]   PHYSICIAN:  Jamison Neighbor, M.D.               DATE OF BIRTH:  [birth year]   DATE OF PROCEDURE:  08/19/2003  DATE OF DISCHARGE:                                 OPERATIVE REPORT   PREOPERATIVE DIAGNOSIS:  Chronic pelvic pain syndrome, probable interstitial  cystitis.   POSTOPERATIVE DIAGNOSIS:  Interstitial cystitis.   PROCEDURE:  1. Cystoscopy.  2. Hydrodistention of bladder.  3. Bladder biopsy.  4. Marcaine and Pyridium instillation.  5. Foley catheter insertion.   SURGEON:  Jamison Neighbor, M.D.   ANESTHESIA:  General.   COMPLICATIONS:  A linear tear noted in bladder during hydrodistention.   HISTORY:  This 77 year old male has been a longstanding patient of Drs.  Rudolpho Sevin.  The patient has been thought to have prostatitis but has  never responded to antibiotic therapy. For many years, the patient has had  uncontrolled urgency, frequency, and bladder pain with radiation and pain  out to the tip of the penis.  The patient has developed problems with  painful intercourse.  Saw Palmetto, Flomax, anticholinergics, and other  agents have not worked nor has long-term anti-inflammatories or antibiotic  therapy.  The patient has done some reading and thinks that he might have  interstitial cystitis.  The patient is interested in InterStim or possible  interstitial cystitis directed therapy.  We considered the possibility of  placing the patient on the chronic prostatitis trial without making a  diagnosis of IC, but he wanted to know exactly what was wrong and is  interested in having a hydrodistention.  The patient understands the risks  and benefits of the procedure and gave full and informed consent.   DESCRIPTION OF PROCEDURE:  After successful induction of  general anesthesia,  the patient was placed in the dorsal lithotomy position, prepped with  Betadine, and draped in the usual sterile fashion.  Cystoscopy was  performed.  The urethra was visualized in its entirety and was found to be  normal.  Beyond the verumontanum, there was no significant prostatic  enlargement and no evidence of any obstruction at the bladder neck.  The  bladder itself was carefully inspected.  It was free of any tumor or stones.  Both ureteral orifices were normal in configuration and location.  There was  a modest degree of trabeculation noted.  The bladder was filled at 100 cm of  water pressure.  The bladder was very small in capacity.  At about 400 mL,  the drip stopped and then began to pick up again.  Immediately it was noted  there was a small linear tear, and the inflow was discontinued.  The bladder  was drained.  A biopsy was taken.  The biopsy  site was cauterized.  The  patient had a Foley catheter placed because of the small  linear tear, and this will allow this to heal.  The patient tolerated the  procedure well and was taken to the recovery room in good condition.  He  will be sent home with Lorcet Plus, Pyridium Plus, and doxycycline and will  need to have the Foley catheter in for approximately one week.                                               Jamison Neighbor, M.D.    RJE/MEDQ  D:  08/19/2003  T:  08/19/2003  Job:  [id number]   cc:   Titus Dubin. Alwyn Ren, M.D. [facility]

## 2011-05-08 ENCOUNTER — Other Ambulatory Visit: Payer: Self-pay | Admitting: Gastroenterology

## 2011-05-10 ENCOUNTER — Ambulatory Visit
Admission: RE | Admit: 2011-05-10 | Discharge: 2011-05-10 | Disposition: A | Discharge status: Home / Self Care | Payer: Medicare Other | Source: Ambulatory Visit | Attending: Gastroenterology | Admitting: Gastroenterology

## 2011-05-16 ENCOUNTER — Encounter: Payer: Self-pay | Admitting: Internal Medicine

## 2011-05-17 ENCOUNTER — Encounter: Payer: Self-pay | Admitting: Internal Medicine

## 2011-05-17 ENCOUNTER — Ambulatory Visit (INDEPENDENT_AMBULATORY_CARE_PROVIDER_SITE_OTHER): Payer: Medicare Other | Admitting: Internal Medicine

## 2011-05-17 DIAGNOSIS — R079 Chest pain, unspecified: Secondary | ICD-10-CM

## 2011-05-17 DIAGNOSIS — R1013 Epigastric pain: Secondary | ICD-10-CM

## 2011-05-17 LAB — LIPASE: Lipase: 23 U/L (ref 11.0–59.0)

## 2011-05-17 LAB — AMYLASE: Amylase: 101 U/L (ref 27–131)

## 2011-05-17 LAB — TROPONIN I: Troponin I: <0.01 ng/mL (ref <0.06)

## 2011-05-17 LAB — CK TOTAL AND CKMB (NOT AT ARMC)
CK, MB: 1.9 ng/mL (ref 0.3–4.0)
Total CK: 81 U/L (ref 7–232)

## 2011-05-17 MED ORDER — HYOSCYAMINE SULFATE 0.125 MG SL SUBL: 0.1250 mg | SUBLINGUAL_TABLET | SUBLINGUAL | Status: AC | PRN

## 2011-05-17 NOTE — Progress Notes (Signed)
  Subjective:    Patient ID: Mark Copeland, male    DOB: Aug 31, 1934, 77 y.o.   MRN: [id number]  HPI CHEST PAIN Location: epigastrium  Quality: initially as "spasm"  Followed by soreness  Duration: up to 5 min  Onset : usually lying, sitting or with waist flexion Radiation: no  Better with: no relievers  Worse with: position as noted; not worse with exertion  Symptoms Nausea/vomiting: no  Diaphoresis: no  Shortness of breath: no  Pleuritic: no  Cough: no  Edema: no  Orthopnea: no  PND: no  Dizziness: no  Palpitations: no  Syncope: no  Indigestion: X 14 days ,relieved the "heart burn" but not the spasm   Red Flags Worse with exertion: no  Recent Immobility: no  Cancer history: no  Tearing/radiation to back: no Dr Florina Ou 7/2 OV reviewed & discussed  Last upper Endo 2009:esophagus dilated         Review of Systems no dysphagia  He walks daily 1-1/2 miles with no cardiopulmonary symptoms of dyspnea, chest pain, palpitations, or claudication.     Objective:   Physical Exam General appearance is one of good health and nourishment w/o distress. Appears younger than stated age.  Eyes: No conjunctival inflammation or scleral icterus is present.  Oral exam: Dental hygiene is good; lips and gums are healthy appearing.There is no oropharyngeal erythema or exudate noted.   Heart:  Normal rate and regular rhythm. S1 and S2 normal without gallop, murmur, click, rub or other extra sounds     Lungs:Chest clear to auscultation; no wheezes, rhonchi,rales ,or rubs present.No increased work of breathing.   No chest wall tenderness.  Abdomen: bowel sounds normal, soft  But tender in epigastrium  without masses, organomegaly or hernias noted.  No guarding or rebound   Extremities:  No cyanosis, edema, or deformity noted . Negative Homan's Skin:Warm & dry.  Intact without suspicious lesions or rashes ; no jaundice or tenting  Lymphatic: No lymphadenopathy is noted about the  head, neck, axilla.              Assessment & Plan:  #1 epigastric pain; past medical history of esophageal reflux with esophageal stricture in 2009. Status post dilation  #2 no cardiopulmonary symptoms with high-level exercise  Plan: Sublingual antispasmodic will be prescribed as needed for spasm. If he does not get significant relief a stress test will  be done clinically recurrent esophageal stricture is of concern.

## 2011-05-17 NOTE — Patient Instructions (Signed)
The triggers for dyspepsia or "heart burn"  include stress; the "aspirin family" ; alcohol; peppermint; and caffeine (coffee, tea, cola, and chocolate). The aspirin family would include aspirin and the nonsteroidal agents such as ibuprofen &  Naproxen. Tylenol would not cause reflux. If having dyspepsia ; food & dink should be avoided for @ least 2 hors before going to bed.  

## 2011-05-18 NOTE — Progress Notes (Signed)
Addended by: Doristine Devoid on: 05/18/2011 11:59 AM   Modules accepted: Orders

## 2011-05-22 ENCOUNTER — Telehealth: Payer: Self-pay | Admitting: Internal Medicine

## 2011-05-22 NOTE — Telephone Encounter (Signed)
Please see if they will allow [place] to have a routine Bruce stress test. His gastroenterologist wants this done to evaluate his chest pain before he does upper endoscopy.

## 2011-05-22 NOTE — Telephone Encounter (Signed)
Per phone call from patient's insurace, [facility] Medicare Complete, their Physician reviewer has Denied authorization for patient to have the Myoview Rest Stress test.  A fax is being sent with the reason, and appeals information.

## 2011-05-27 NOTE — Telephone Encounter (Addendum)
Insurance will allow Mark Copeland, no prior-auth required.  Order for Mark Copeland needs to be entered please.  Remove prior Order, and per Cardiology enter as "EXC" or "TREADMILL" and the correct test should come up.

## 2011-05-30 ENCOUNTER — Other Ambulatory Visit: Payer: Self-pay | Admitting: Internal Medicine

## 2011-05-30 DIAGNOSIS — R0789 Other chest pain: Secondary | ICD-10-CM

## 2011-06-17 ENCOUNTER — Ambulatory Visit (INDEPENDENT_AMBULATORY_CARE_PROVIDER_SITE_OTHER): Payer: Medicare Other | Admitting: Physician Assistant

## 2011-06-17 DIAGNOSIS — R0789 Other chest pain: Secondary | ICD-10-CM

## 2011-06-17 NOTE — Progress Notes (Signed)
Exercise Treadmill Test  Pre-Exercise Testing Evaluation Rhythm: normal sinus  Rate: 61   PR:  .19 QRS:  .09  QT:  .41 QTc: .42   P axis:   QRS axis:    ST Segments:       Test  Exercise Tolerance Test Ordering MD: Dr. Alwyn Ren  Interpreting MD:  Tereso Newcomer, PA-C  Unique Test No: 1  Treadmill:  1  Indication for ETT: chest pain - rule out ischemia  Contraindication to ETT: No   Stress Modality: exercise - treadmill  Cardiac Imaging Performed: non   Protocol: standard Bruce - maximal  Max BP:  225/97  Max MPHR (bpm):  145 85% MPR (bpm):  123  MPHR obtained (bpm):  142 % MPHR obtained:  97  Reached 85% MPHR (min:sec):  5:00 Total Exercise Time (min-sec):  7:00  Workload in METS:  11.7 Borg Scale: 13  Reason ETT Terminated:  desired heart rate attained    ST Segment Analysis At Rest: normal ST segments - no evidence of significant ST depression With Exercise: no evidence of significant ST depression  Other Information Arrhythmia:  occasional PVCs Angina during ETT:  absent (0) Quality of ETT:  diagnostic  ETT Interpretation:  normal - no evidence of ischemia by ST analysis  Comments: Good exercise tolerance. Hypertensive BP response. No chest pain. No ST-T changes to suggest ischemia.  Recommendations: Patient has "white coat HTN" Follow up with Dr. Alwyn Ren and Dr. Madilyn Fireman as directed.

## 2011-06-26 ENCOUNTER — Other Ambulatory Visit (HOSPITAL_COMMUNITY): Payer: Self-pay | Admitting: Gastroenterology

## 2011-06-26 ENCOUNTER — Other Ambulatory Visit: Payer: Self-pay | Admitting: Gastroenterology

## 2011-07-08 ENCOUNTER — Encounter (HOSPITAL_COMMUNITY): Payer: Self-pay

## 2011-07-08 ENCOUNTER — Encounter (HOSPITAL_COMMUNITY)
Admission: RE | Admit: 2011-07-08 | Discharge: 2011-07-08 | Disposition: A | Discharge status: Home / Self Care | Payer: Medicare Other | Source: Ambulatory Visit | Attending: Gastroenterology | Admitting: Gastroenterology

## 2011-07-08 DIAGNOSIS — R109 Unspecified abdominal pain: Secondary | ICD-10-CM | POA: Insufficient documentation

## 2011-07-08 DIAGNOSIS — K838 Other specified diseases of biliary tract: Secondary | ICD-10-CM | POA: Insufficient documentation

## 2011-07-08 MED ORDER — TECHNETIUM TC 99M MEBROFENIN IV KIT
5.3000 | PACK | Freq: Once | INTRAVENOUS | Status: AC | PRN
  Administered 2011-07-08: 5.3 via INTRAVENOUS

## 2011-07-08 MED ORDER — SINCALIDE 5 MCG IJ SOLR: 0.0200 ug/kg | Freq: Once | INTRAMUSCULAR | Status: DC

## 2011-07-13 ENCOUNTER — Observation Stay (HOSPITAL_COMMUNITY)
Admission: EM | Admit: 2011-07-13 | Discharge: 2011-07-15 | Disposition: A | Discharge status: Home / Self Care | Payer: Medicare Other | Attending: General Surgery | Admitting: General Surgery

## 2011-07-13 DIAGNOSIS — Z79899 Other long term (current) drug therapy: Secondary | ICD-10-CM | POA: Insufficient documentation

## 2011-07-13 DIAGNOSIS — K219 Gastro-esophageal reflux disease without esophagitis: Secondary | ICD-10-CM | POA: Insufficient documentation

## 2011-07-13 DIAGNOSIS — N400 Enlarged prostate without lower urinary tract symptoms: Secondary | ICD-10-CM | POA: Insufficient documentation

## 2011-07-13 DIAGNOSIS — R1013 Epigastric pain: Secondary | ICD-10-CM | POA: Insufficient documentation

## 2011-07-13 DIAGNOSIS — E785 Hyperlipidemia, unspecified: Secondary | ICD-10-CM | POA: Insufficient documentation

## 2011-07-13 DIAGNOSIS — K811 Chronic cholecystitis: Principal | ICD-10-CM | POA: Insufficient documentation

## 2011-07-13 DIAGNOSIS — K449 Diaphragmatic hernia without obstruction or gangrene: Secondary | ICD-10-CM | POA: Insufficient documentation

## 2011-07-13 LAB — COMPREHENSIVE METABOLIC PANEL
ALT: 15 U/L (ref 0–53)
Albumin: 4.3 g/dL (ref 3.5–5.2)
Alkaline Phosphatase: 77 U/L (ref 39–117)
BUN: 16 mg/dL (ref 6–23)
Potassium: 4.9 mEq/L (ref 3.5–5.1)
Sodium: 138 mEq/L (ref 135–145)
Total Protein: 7.3 g/dL (ref 6.0–8.3)

## 2011-07-13 LAB — DIFFERENTIAL
Basophils Absolute: 0.1 10*3/uL (ref 0.0–0.1)
Basophils Relative: 1 % (ref 0–1)
Eosinophils Absolute: 0.3 10*3/uL (ref 0.0–0.7)
Eosinophils Relative: 4 % (ref 0–5)
Monocytes Absolute: 0.8 10*3/uL (ref 0.1–1.0)

## 2011-07-13 LAB — LIPASE, BLOOD: Lipase: 17 U/L (ref 11–59)

## 2011-07-13 LAB — CBC
MCHC: 35 g/dL (ref 30.0–36.0)
Platelets: 216 10*3/uL (ref 150–400)
RDW: 12.5 % (ref 11.5–15.5)

## 2011-07-13 LAB — URINALYSIS, ROUTINE W REFLEX MICROSCOPIC
Bilirubin Urine: NEGATIVE
Ketones, ur: NEGATIVE mg/dL
Nitrite: NEGATIVE
Urobilinogen, UA: 0.2 mg/dL (ref 0.0–1.0)

## 2011-07-14 ENCOUNTER — Other Ambulatory Visit (INDEPENDENT_AMBULATORY_CARE_PROVIDER_SITE_OTHER): Payer: Self-pay | Admitting: General Surgery

## 2011-07-14 DIAGNOSIS — R1013 Epigastric pain: Secondary | ICD-10-CM

## 2011-07-14 DIAGNOSIS — K811 Chronic cholecystitis: Secondary | ICD-10-CM

## 2011-07-14 DIAGNOSIS — R1011 Right upper quadrant pain: Secondary | ICD-10-CM

## 2011-07-14 HISTORY — PX: LAPAROSCOPIC CHOLECYSTECTOMY: SUR755

## 2011-07-18 ENCOUNTER — Telehealth (INDEPENDENT_AMBULATORY_CARE_PROVIDER_SITE_OTHER): Payer: Self-pay | Admitting: General Surgery

## 2011-07-18 NOTE — Telephone Encounter (Signed)
Called patient and left message on machine for them to call back and ask for me.

## 2011-07-18 NOTE — Telephone Encounter (Signed)
Patient aware his path showed chronic inflammation, no stones. He has a follow up appt in a couple weeks. He will keep appt and call with any problems prior.

## 2011-07-18 NOTE — Telephone Encounter (Signed)
Message copied by Liliana Cline on Thu Jul 18, 2011  9:09 AM ------      Message from: Andrey Campanile, ERIC M      Created: Thu Jul 18, 2011  7:29 AM       pls call pt with path report

## 2011-07-23 ENCOUNTER — Encounter (INDEPENDENT_AMBULATORY_CARE_PROVIDER_SITE_OTHER): Payer: Medicare Other | Admitting: Surgery

## 2011-07-26 NOTE — Op Note (Signed)
NAME:  PHELAN, SCHADT NO.:  [id number]  MEDICAL RECORD NO.:  [id number]  LOCATION:  1525                         FACILITY:  [facility]  PHYSICIAN:  Mary Sella. Andrey Campanile, MD     DATE OF BIRTH:  1934-08-30  DATE OF PROCEDURE:  07/14/2011 DATE OF DISCHARGE:                              OPERATIVE REPORT   PREOPERATIVE DIAGNOSIS:  Biliary dyskinesia.  POSTOPERATIVE DIAGNOSIS:  Biliary dyskinesia.  PROCEDURE:  Laparoscopic cholecystectomy.  SURGEON:  Mary Sella. Andrey Campanile, MD  ASSISTANT SURGEON:  Lorne Skeens. Hoxworth, M.D.  ANESTHESIA:  General plus local consisting with 0.25% Marcaine with epinephrine.  SPECIMEN:  Gallbladder.  ESTIMATED BLOOD LOSS:  Minimal.  FINDINGS:  The gallbladder was extremely thin walled.  As a result, there was a spillage of bile.  However, there was no spillage of stones. The critical view was obtained.  COMPLICATIONS:  None immediately apparent.  INDICATIONS FOR PROCEDURE:  Patient is a 77 year old gentleman, who has had epigastric pain for several months now.  Most recently, it has become more and more frequent and more intense.  It is generally postprandial in nature.  The pain will last several minutes at a time. He underwent an upper endoscopy, which was within normal limits.  He has also undergone cardiac treadmill stress test, which was within normal limits.  He underwent an ultrasound, which demonstrated no gallstones. He underwent a HIDA scan, which showed a poorly functioning gallbladder. The EF was around 6%.  He was actually scheduled to see Dr. Jamey Ripa in the office on September 11.  However, he called stating that the pain was becoming more frequent and more intense, and the narcotics were no longer maintaining his pain.  Therefore, I admitted him for pain control.  We discussed the risks and benefits of surgery included, but not limited to bleeding, infection, injury to surrounding structures, bile leak, prolonged diarrhea,  failure to ameliorate his abdominal pain, common bile duct injury, incisional hernia, blood clot formation.  He elected to proceed with surgery.  DESCRIPTION PROCEDURE:  After obtaining informed consent, the patient was brought to the operating room, placed supine on the operating room table.  General endotracheal anesthesia was established.  A surgical time-out was performed.  Sequential compression devices were placed. His abdomen was prepped and draped in the usual standard surgical fashion with lower prep.  He received Invanz prior to skin incision. Surgical time-out was performed.  Local was infiltrated at the bases of umbilicus.  Next, a 1.5 cm vertical infraumbilical incision was made with a #11 blade.  The fascia was grasped with Kochers and lifted anteriorly.  Next, the fascia was incised with #11 blade.  The abdominal cavity was entered.  A pursestring suture consisting of 0 Vicryl and UR- 6 needle was placed around the fascial edges and Hasson trocar was placed directly into the abdominal cavity being a 12 mm Hasson.   pneumoperitoneum was smoothly established up to the patient pressure of 15 mmHg.  Laparoscope was advanced.  There was no injury to surrounding structures.  The patient was placed on reverse Trendelenburg, rotated to the left.  Three 5 mm trocars were placed, 1 in the subxiphoid and 2 in the right  hypochondrium, all under direct visualization after local had been infiltrated.  The gallbladder was grasped and retracted to the right shoulder.  There were some omental attachments.  These were taken down with hook electrocautery.  In taking down the some omental attachments, the small nick was made in the gallbladder wall.  The wall was very thin.  There was spillage of bile.  Using the suction irrigator catheter, I decompressed the gallbladder.  I suctioned out the spilled bile.  There was no evidence of spilled gallstones.  The neck was grasped and retracted  laterally.  Then using the [facility], I circumferentially dissected out around the neck to identify the cystic artery as well as the cystic duct.  These were circumferentially dissected out around with aid of [facility].  Three clips were placed on the biliary aspect of the cystic duct and 1 clip was placed distally as it entered the gallbladder.  It was then transected with Endo Shears.  Two clips were placed on the downside the cystic artery and 1 distal was then transected.  There was a small posterior branch and 2 clips were placed from the downside of this, and I elected to use hook electrocautery to ligate it distally.  We then rolled the gallbladder up and the gallbladder fossa.  The back wall of the gallbladder was entered as we rolled it out of the gallbladder fossa.  Again, it was extremely thin walled.  However, we were able to re-grasp the posterior wall and isolated from the liver parenchyma.  The gallbladder was excised in its entirety.  The laparoscope was then placed in the subxiphoid trocar and the endobag was advanced through the umbilical trocar.  The gallbladder was placed within the specimen bag and extracted within abdominal cavity.  Pneumoperitoneum was reestablished.  Laparoscope was placed back through the umbilicus.  We then obtained hemostasis with hook electrocautery from the gallbladder fossa.  I then irrigated the right upper quadrant with 2 liters of saline until it was clear.  There was no evidence of bleeding.  There was no evidence of bile leak.  I removed the Hasson trocar and tied down the previously placed pursestring suture.  Our fascial closure was airtight and nothing was in our fascial closure and this was confirmed laparoscopically.  Pneumoperitoneum was released.  Remaining trocars were removed.  All skin incisions were closed with 4-0 Monocryl in subcuticular fashion.  There were no immediate complications.  Patient tolerated the  procedure well.  He was extubated and taken to the recovery room in stable condition.  All needle and sponge counts were correct x2.     Mary Sella. Andrey Campanile, MD     EMW/MEDQ  D:  07/14/2011  T:  07/15/2011  Job:  [id number]  cc:   Titus Dubin. Alwyn Ren, MD,FACP,FCCP [phone] [place] [address]  Dr. Madilyn Fireman  Electronically Signed by Gaynelle Adu M.D. on 07/26/2011 08:12:29 AM

## 2011-07-26 NOTE — H&P (Signed)
NAME:  Mark Copeland, Mark Copeland NO.:  [id number]  MEDICAL RECORD NO.:  [id number]  LOCATION:  1525                         FACILITY:  [facility]  PHYSICIAN:  Mary Sella. Andrey Campanile, MD     DATE OF BIRTH:  1934-04-13  DATE OF ADMISSION:  07/13/2011 DATE OF DISCHARGE:                             HISTORY & PHYSICAL   ADMITTING SERVICE:  [facility].  PRIMARY CARE PHYSICIAN:  Bertel Venard. Alwyn Ren, MD,FACP,FCCP.  GASTROENTEROLOGIST:  Everardo All. Madilyn Fireman, M.D.  REFERRING PHYSICIAN:  Dr. Freida Busman.  CHIEF COMPLAINT:  Epigastric pain.  HISTORY OF PRESENT ILLNESS:  Mr. Bredeson is a very pleasant 77 year old Caucasian male who has a several month history of abdominal pain mainly in his epigastric area that he describes as a spasm sensation.  The pain generally occurs once a day and it is mainly postprandial after eating. He underwent an exhaustive workup and seen several different doctors. He has had a treadmill stress test, which was within normal limits.  He has had an upper endoscopy by Dr. Madilyn Fireman, which by report showed only a known hiatal hernia without any signs of ulcers or strictures or masses. The patient underwent a HIDA scan as an outpatient several days ago, which demonstrated a gallbladder with an ejection fraction of around 6% and he has actually scheduled to see Dr. Jamey Ripa on the July 23, 2011.  He called our office line today saying that the pain was coming more and more frequent and getting more and more intense and he really could no longer tolerate the discomfort at home.  He had been given narcotics for his intermittent pain that he has been having and in the past several weeks it has been controlling his pain, however, over the past day or 2, the narcotics really has not been alleviating his discomfort.  He elected to come to the emergency room for evaluation.  He denies any nausea, vomiting, fevers or chills, diarrhea or constipation.  He denies any jaundice  or acholic stools.  He denies any dysphasia.  He denies any NSAID use.  He states that even with diet modification, he still has pain after eating.  He states that he has been eating somewhat normally, but he just has been sort of suffering through the discomfort.  PAST MEDICAL HISTORY: 1. Mild hyperlipidemia. 2. Gastroesophageal reflux disease. 3. Hiatal hernia. 4. History of BPH. 5. History of interstitial cystitis.  PAST SURGICAL HISTORY: 1. EGD with dilation in 2009 for Schatzki's ring. 2. TURP. 3. EGD 3 weeks ago which by report showed no ulcers or masses     according to the patient.  MEDICATIONS:  Prevacid.  ALLERGIES:  To PENICILLIN and SULFA.  FAMILY HISTORY:  Unremarkable.  No cancer.  SOCIAL HISTORY:  He has a remote history of a smoker.  He quit smoking in the 1950s.  He only drinks wine with a nice meal, which is infrequent.  He denies any drug use.  He is retired, lives with his wife.  REVIEW OF SYSTEMS:  He had a negative exercise treadmill stress test. He says his blood pressure only is elevated in doctor's office.  When his wife checks it at home, it has a systolic  of 120.  He wears glasses. He does endorse nocturia.  He walks every day.  He denies any shortness of breath, chest pain, or dyspnea on exertion.  Otherwise, the comprehensive 12-point review of systems is negative except for what was mentioned in the HPI.  PHYSICAL EXAM:  VITAL SIGNS:  He is afebrile, heart rate 65, blood pressure 188/91, respirations 16, satting 99% on room air. GENERAL:  Well-developed, well-nourished Caucasian male, in no apparent stress. HEENT: Atraumatic, normocephalic.  Pupils are equal.  No scleral icterus.  Positive glasses.  No external ear lesions.  Hearing grossly normal. NECK:  Supple.  No lymphadenopathy.  Trachea is midline. PULMONARY:  Lungs are clear.  Symmetric chest rise.  No accessory use of muscles. CARDIOVASCULAR:  Regular rhythm.  2+ radial  pulse. ABDOMEN:  Soft, nondistended.  He has some mild epigastric tenderness to palpation.  No rebound, no guarding, no hernia. MUSCULOSKELETAL:  Free range of motion.  Moves all extremities. SKIN:  No jaundice.  No rash, no edema. NEURO:  Nonfocal.  Sensation grossly intact. PSYCHIATRIC:  Alert and oriented x3.  Judgment and insight appear appropriate.  Labs are pending.  DIAGNOSTICS: 1. HIDA scan from July 08, 2011, which I personally reviewed showed     gallbladder hypokinesis.  There is small-bowel activity visualized     within an hour.  The EF of the gallbladder was around 6%. 2. Ultrasound on May 10, 2011, showed gallbladder sludge.  No     gallstones.  No wall thickening.  Common bile duct was within     normal limits and fatty infiltration of the liver.  IMPRESSION:  A 77 year old Caucasian male with hyperlipidemia, gastroesophageal reflux disease, hiatal hernia, benign prostatic hypertrophy, and of biliary dyskinesia who has failed outpatient management.  PLAN:  It appears that he has become more and more symptomatic from his gallbladder.  I do not have his labs back at this time.  I do not think he has acute cholecystitis.  He says he has failed outpatient management of his biliary tract disease.  We will admit him for pain control.  We will tentatively plan a laparoscopic cholecystectomy with cholangiogram within the next 24-48 hours.  We will give him antiemetics as needed and we will start him on some IV fluids.  We will start DVT prophylaxis as well.     Mary Sella. Andrey Campanile, MD     EMW/MEDQ  D:  07/13/2011  T:  07/13/2011  Job:  [id number]  cc:   Everardo All. Madilyn Fireman, M.D. Fax: [phone]  Titus Dubin. Alwyn Ren, MD,FACP,FCCP [phone] [place] [address]  Electronically Signed by Gaynelle Adu M.D. on 07/26/2011 08:10:05 AM

## 2011-08-07 ENCOUNTER — Encounter (INDEPENDENT_AMBULATORY_CARE_PROVIDER_SITE_OTHER): Payer: Self-pay | Admitting: General Surgery

## 2011-08-07 ENCOUNTER — Ambulatory Visit (INDEPENDENT_AMBULATORY_CARE_PROVIDER_SITE_OTHER): Payer: Medicare Other | Admitting: General Surgery

## 2011-08-07 ENCOUNTER — Encounter (INDEPENDENT_AMBULATORY_CARE_PROVIDER_SITE_OTHER): Payer: Medicare Other | Admitting: General Surgery

## 2011-08-07 VITALS — BP 164/92 | HR 76 | Ht 70.0 in | Wt 170.0 lb

## 2011-08-07 DIAGNOSIS — Z09 Encounter for follow-up examination after completed treatment for conditions other than malignant neoplasm: Secondary | ICD-10-CM

## 2011-08-07 DIAGNOSIS — K811 Chronic cholecystitis: Secondary | ICD-10-CM

## 2011-08-07 NOTE — Patient Instructions (Signed)
The gas sensation should get better.  Will bring you back in 6 weeks to see how you are doing.  If your symptoms worsen, please call

## 2011-08-07 NOTE — Progress Notes (Signed)
Chief complaint: Postop  Procedure: Status post laparoscopic cholecystectomy with interoperative cholangiogram July 14, 2011  History of Present Ilness: 77 year old Caucasian male comes in today for his first postoperative appointment. He was actually admitted to [facility] on September 1 for worsening abdominal pain. He was discharged home on September 3. He states that the spasm sensation that he had in his upper abdomen prior to surgery is much improved. He denies any fevers or chills. He denies any nausea or vomiting. He denies any diarrhea or constipation. He denies any problems with his incisions. He states that his appetite is good. His only problem is gas pain. He states he has a sensation of gas in his upper abdomen. He has to take simethicone to get relief. After he takes the simethicone he belches which alleviates his discomfort. He denies any chest pain or reflux symptoms.  Physical Exam: Well-developed well-nourished Caucasian male in no apparent distress Pulmonary-lungs are clear to auscultation Cardiac-regular rate and rhythm Abdomen-soft, nontender, nondistended. Well-healed trocar incisions. No signs of incisional hernia. Skin-no jaundice  Pathology: Gallbladder showed chronic cholecystitis  Assessment and Plan: Status post laparoscopic cholecystectomy with interoperative cholangiogram for chronic cholecystitis and biliary dyskinesia.  We discussed this pathology report. I am unsure as to the exact etiology of his gas pain. My feeling is that this sensation will improve with time. I would like to give it a few weeks to see if it resolves. He is otherwise asymptomatic. I will see him in 6 weeks. If it worsens I told him to call the office.

## 2011-08-22 LAB — POCT HEMOGLOBIN-HEMACUE: Hemoglobin: 17.1 — ABNORMAL HIGH

## 2011-08-22 NOTE — Discharge Summary (Signed)
  NAME:  Mark Copeland, Mark Copeland NO.:  [id number]  MEDICAL RECORD NO.:  [id number]  LOCATION:  1525                         FACILITY:  [facility]  PHYSICIAN:  Mary Sella. Andrey Campanile, MD     DATE OF BIRTH:  1933-11-18  DATE OF ADMISSION:  07/13/2011 DATE OF DISCHARGE:  07/15/2011                              DISCHARGE SUMMARY   REASON FOR ADMISSION:  [name] is a 77 year old male, who has had several months of abdominal pain mostly in the epigastrium.  Most of his pain is usually postprandial.  He has had an extensive workup with several different physicians.  He has had a cardiac source ruled out as well as an upper endoscopy by Dr. Madilyn Fireman.  He had a HIDA scan as an outpatient several days ago, which demonstrated an ejection fraction of 6%.  He is scheduled to see one of her surgeons, however, he began having an increase in abdominal pain and came to the emergency department.  Please see admitting history of physical for further details.  ADMITTING DIAGNOSES: 1. Hyperlipidemia. 2. GERD. 3. Hiatal hernia. 4. BPH. 5. Biliary dyskinesia.  HOSPITAL COURSE:  At this time, the patient was admitted.  He was later taken to the operating room where he had a laparoscopic cholecystectomy. The patient tolerated this procedure well.  Postoperatively, his diet was advanced as tolerated.  His pain was well controlled with p.o. pain medicine.  He is, otherwise, felt stable for discharge home.  DISCHARGE DIAGNOSES: 1. Biliary dyskinesia. 2. GERD. 3. Hyperlipidemia. 4. Hiatal hernia. 5. BPH.  DISCHARGE MEDICATIONS:  He is given a prescription for Vicodin and he may resume his Prevacid.  DISCHARGE INSTRUCTIONS:  He may increase his activity slowly and shower. He is not to do any heavy lifting for 2 weeks.  He is not to drive while he takes his pain pills.  He may resume a low-fat diet.  He is to follow up with Dr. Andrey Campanile in 2 to 3 weeks.     Letha Cape,  PA   ______________________________ Mary Sella. Andrey Campanile, MD    KEO/MEDQ  D:  07/22/2011  T:  07/23/2011  Job:  [id number]  Electronically Signed by Barnetta Chapel PA on 08/05/2011 01:18:48 PM Electronically Signed by Gaynelle Adu M.D. on 08/22/2011 12:22:02 PM

## 2011-08-30 ENCOUNTER — Other Ambulatory Visit: Payer: Self-pay | Admitting: Internal Medicine

## 2011-09-17 ENCOUNTER — Ambulatory Visit: Payer: Medicare Other | Admitting: Internal Medicine

## 2011-09-25 ENCOUNTER — Encounter (INDEPENDENT_AMBULATORY_CARE_PROVIDER_SITE_OTHER): Payer: Medicare Other | Admitting: General Surgery

## 2011-10-23 ENCOUNTER — Emergency Department (HOSPITAL_COMMUNITY): Payer: Medicare Other

## 2011-10-23 ENCOUNTER — Observation Stay (HOSPITAL_COMMUNITY)
Admission: EM | Admit: 2011-10-23 | Discharge: 2011-10-24 | Disposition: A | Discharge status: Home / Self Care | Payer: Medicare Other | Attending: Internal Medicine | Admitting: Internal Medicine

## 2011-10-23 ENCOUNTER — Other Ambulatory Visit: Payer: Self-pay

## 2011-10-23 ENCOUNTER — Encounter (HOSPITAL_COMMUNITY): Payer: Self-pay | Admitting: Adult Health

## 2011-10-23 DIAGNOSIS — I359 Nonrheumatic aortic valve disorder, unspecified: Secondary | ICD-10-CM

## 2011-10-23 DIAGNOSIS — E785 Hyperlipidemia, unspecified: Secondary | ICD-10-CM | POA: Insufficient documentation

## 2011-10-23 DIAGNOSIS — R911 Solitary pulmonary nodule: Secondary | ICD-10-CM | POA: Insufficient documentation

## 2011-10-23 DIAGNOSIS — R410 Disorientation, unspecified: Secondary | ICD-10-CM

## 2011-10-23 DIAGNOSIS — R42 Dizziness and giddiness: Secondary | ICD-10-CM | POA: Insufficient documentation

## 2011-10-23 DIAGNOSIS — I519 Heart disease, unspecified: Secondary | ICD-10-CM | POA: Insufficient documentation

## 2011-10-23 DIAGNOSIS — F29 Unspecified psychosis not due to a substance or known physiological condition: Secondary | ICD-10-CM | POA: Insufficient documentation

## 2011-10-23 DIAGNOSIS — R4182 Altered mental status, unspecified: Principal | ICD-10-CM | POA: Diagnosis present

## 2011-10-23 DIAGNOSIS — I10 Essential (primary) hypertension: Secondary | ICD-10-CM | POA: Diagnosis present

## 2011-10-23 DIAGNOSIS — G459 Transient cerebral ischemic attack, unspecified: Secondary | ICD-10-CM | POA: Insufficient documentation

## 2011-10-23 LAB — COMPREHENSIVE METABOLIC PANEL
ALT: 15 U/L (ref 0–53)
AST: 23 U/L (ref 0–37)
Albumin: 4.6 g/dL (ref 3.5–5.2)
Alkaline Phosphatase: 89 U/L (ref 39–117)
Chloride: 101 mEq/L (ref 96–112)
Potassium: 4.2 mEq/L (ref 3.5–5.1)
Total Bilirubin: 0.4 mg/dL (ref 0.3–1.2)

## 2011-10-23 LAB — URINALYSIS, ROUTINE W REFLEX MICROSCOPIC
Glucose, UA: NEGATIVE mg/dL
Hgb urine dipstick: NEGATIVE
Ketones, ur: NEGATIVE mg/dL
Protein, ur: NEGATIVE mg/dL

## 2011-10-23 LAB — DIFFERENTIAL
Basophils Absolute: 0.1 10*3/uL (ref 0.0–0.1)
Basophils Relative: 1 % (ref 0–1)
Monocytes Relative: 6 % (ref 3–12)
Neutro Abs: 6.6 10*3/uL (ref 1.7–7.7)
Neutrophils Relative %: 60 % (ref 43–77)

## 2011-10-23 LAB — CBC
HCT: 45.3 % (ref 39.0–52.0)
Hemoglobin: 17.3 g/dL — ABNORMAL HIGH (ref 13.0–17.0)
MCHC: 34.4 g/dL (ref 30.0–36.0)
MCV: 86.8 fL (ref 78.0–100.0)
Platelets: 202 10*3/uL (ref 150–400)
RBC: 5.22 MIL/uL (ref 4.22–5.81)
WBC: 10.3 10*3/uL (ref 4.0–10.5)

## 2011-10-23 LAB — CREATININE, SERUM: GFR calc Af Amer: 78 mL/min — ABNORMAL LOW (ref >90)

## 2011-10-23 LAB — CARDIAC PANEL(CRET KIN+CKTOT+MB+TROPI): CK, MB: 2 ng/mL (ref 0.3–4.0)

## 2011-10-23 LAB — GLUCOSE, CAPILLARY: Glucose-Capillary: 90 mg/dL (ref 70–99)

## 2011-10-23 LAB — PRO B NATRIURETIC PEPTIDE: Pro B Natriuretic peptide (BNP): 65.7 pg/mL (ref 0–450)

## 2011-10-23 MED ORDER — ENOXAPARIN SODIUM 40 MG/0.4ML ~~LOC~~ SOLN
40.0000 mg | SUBCUTANEOUS | Status: DC
  Administered 2011-10-23: 40 mg via SUBCUTANEOUS
  Filled 2011-10-23 (×3): qty 0.4

## 2011-10-23 MED ORDER — ASPIRIN 81 MG PO CHEW
CHEWABLE_TABLET | ORAL | Status: AC
  Administered 2011-10-23: 81 mg
  Filled 2011-10-23: qty 1

## 2011-10-23 MED ORDER — METOPROLOL TARTRATE 1 MG/ML IV SOLN
2.5000 mg | Freq: Once | INTRAVENOUS | Status: AC
  Administered 2011-10-23: 2.5 mg via INTRAVENOUS
  Filled 2011-10-23: qty 5

## 2011-10-23 MED ORDER — ASPIRIN EC 81 MG PO TBEC
81.0000 mg | DELAYED_RELEASE_TABLET | Freq: Every day | ORAL | Status: DC
  Administered 2011-10-24: 81 mg via ORAL
  Filled 2011-10-23 (×4): qty 1

## 2011-10-23 MED ORDER — METOPROLOL TARTRATE 1 MG/ML IV SOLN
2.5000 mg | Freq: Four times a day (QID) | INTRAVENOUS | Status: DC | PRN
  Administered 2011-10-24: 2.5 mg via INTRAVENOUS
  Filled 2011-10-23: qty 5

## 2011-10-23 MED ORDER — LORAZEPAM 2 MG/ML IJ SOLN
0.5000 mg | Freq: Four times a day (QID) | INTRAMUSCULAR | Status: DC | PRN
  Administered 2011-10-24: 0.5 mg via INTRAVENOUS
  Filled 2011-10-23: qty 1

## 2011-10-23 MED ORDER — ASPIRIN 81 MG PO CHEW
CHEWABLE_TABLET | ORAL | Status: AC
  Filled 2011-10-23: qty 1

## 2011-10-23 MED ORDER — ACETAMINOPHEN 325 MG PO TABS: 650.0000 mg | ORAL_TABLET | ORAL | Status: DC | PRN

## 2011-10-23 NOTE — ED Notes (Signed)
CBG:90 

## 2011-10-23 NOTE — ED Provider Notes (Addendum)
History     CSN: [id number] Arrival date & time: 10/23/2011 11:44 AM   First MD Initiated Contact with Patient 10/23/11 1153      Chief Complaint  Patient presents with  . Altered Mental Status     HPI  77yo history of hyperlipidemia presents with confusion. The patient states that approximately one hour prior to arrival he suddenly became confused and could not remember what he ate for breakfast that morning. He also cannot call the names of some of his closest friends. Patient states that he did not feel like he had difficulty with word finding at that time. This lasted for a few minutes and the patient was back to his baseline. There is a second episode just prior to arrival and now patient also feels back to baseline. He denies headache, dizziness. He denies numbness, tingling, weakness of his extremities. He denies chest pain, shortness of breath, fever, chills. He denies abdominal pain, nausea, vomiting. Denies hematuria/dysuria/freq/urgency. History of similar in the past. No recent changes in medications. No sick contacts.    Past Medical History  Diagnosis Date  . Interstitial cystitis     presentation in 1997 w/ abdominal pain/distention, Dr. Eudelia Bunch, [facility]  . Colonic polyp     Dr.John hayes  . BPH (benign prostatic hypertrophy)   . Skin cancer     basal cell, Dr.Dan Yetta Barre  . Hyperlipidemia   . Fasting hyperglycemia   . Nephrolithiasis     x 2    Past Surgical History  Procedure Date  . Cystoscopy     2004/2011; Hospitalized 1997 w/ I.C.  . Colonoscopy w/ polypectomy     and esophageal dilation 01/2008 by Dr.Hayes; bladder stones s/p urethral dilation 10/2007 by Dr. Logan Bores  . Tonsillectomy   . Septoplasty   . Laparoscopic cholecystectomy 07/14/11    Family History  Problem Relation Age of Onset  . Stroke Father     cva  . Hypertension Father   . Coronary artery disease Father   . Stroke Mother   . Hypertension Brother     History  Substance Use Topics    . Smoking status: Former Smoker    Quit date: 08/04/1960  . Smokeless tobacco: Not on file   Comment: quit 50 years ago   . Alcohol Use: No    Review of Systems  All other systems reviewed and are negative.  except as noted HPI   Allergies  Penicillins; Sulfonamide derivatives; and Tamsulosin  Home Medications   Current Outpatient Rx  Name Route Sig Dispense Refill  . FEXOFENADINE HCL 180 MG PO TABS Oral Take 180 mg by mouth daily.      Marland Kitchen FLUTICASONE PROPIONATE 50 MCG/ACT NA SUSP  USE 1 SPRAY IN EACH NOSTRIL TWICE DAILY AS NEEDED 16 g 2  . SIMETHICONE 80 MG PO CHEW Oral Chew 80 mg by mouth every 6 (six) hours as needed.        BP 164/96  Pulse 75  Temp(Src) 98.8 F (37.1 C) (Oral)  Resp 16  SpO2 100%  Physical Exam  Nursing note and vitals reviewed. Constitutional: He is oriented to person, place, and time. He appears well-developed and well-nourished. No distress.  HENT:  Head: Atraumatic.  Mouth/Throat: Oropharynx is clear and moist.  Eyes: Conjunctivae are normal. Pupils are equal, round, and reactive to light.  Neck: Neck supple.  Cardiovascular: Normal rate, regular rhythm, normal heart sounds and intact distal pulses.  Exam reveals no gallop and no friction rub.  No murmur heard. Pulmonary/Chest: Effort normal. No respiratory distress. He has no wheezes. He has no rales.  Abdominal: Soft. Bowel sounds are normal. There is no tenderness. There is no rebound and no guarding.  Musculoskeletal: Normal range of motion. He exhibits no edema and no tenderness.  Neurological: He is alert and oriented to person, place, and time.       Strength 5/5 all extremities No pronator drift No facial droop   Skin: Skin is warm and dry.  Psychiatric: He has a normal mood and affect.    Date: 10/23/2011  Rate: 88  Rhythm: normal sinus rhythm  QRS Axis: normal  Intervals: normal  ST/T Wave abnormalities: normal  Conduction Disutrbances:none  Narrative Interpretation:    Old EKG Reviewed: unchanged  ED Course  Procedures (including critical care time)  Labs Reviewed  CBC - Abnormal; Notable for the following:    WBC 11.1 (*)    RBC 5.86 (*)    Hemoglobin 17.3 (*)    All other components within normal limits  COMPREHENSIVE METABOLIC PANEL - Abnormal; Notable for the following:    Glucose, Bld 100 (*)    GFR calc non Af Amer 78 (*)    All other components within normal limits  GLUCOSE, CAPILLARY  DIFFERENTIAL  URINALYSIS, ROUTINE W REFLEX MICROSCOPIC  POCT CBG MONITORING  HEMOGLOBIN A1C  LIPID PANEL   Dg Chest 2 View  10/23/2011  *RADIOLOGY REPORT*  Clinical Data: Altered mental status.  Dizziness.  CHEST - 2 VIEW  Comparison: 04/13/2009, 72,009, 04/02/2007  Findings: Heart size is normal.  Mediastinal shadows are normal except for unfolding of the aorta.  The lungs are clear except for a 5 mm chronic unchanged nodule at the left base laterally and a few 3 mm granulomas elsewhere in the left perihilar region.  No evidence of mass, infiltrate, collapse or effusion.  Ordinary degenerative changes effect the spine.  IMPRESSION: No active cardiopulmonary disease.  Chronic granulomas on the left without change.  Original Report Authenticated By: Thomasenia Sales, M.D.   Ct Head Wo Contrast  10/23/2011  *RADIOLOGY REPORT*  Clinical Data: Altered mental status.  CT HEAD WITHOUT CONTRAST  Technique:  Contiguous axial images were obtained from the base of the skull through the vertex without contrast.  Comparison: None.  Findings: No acute intracranial abnormality.  Specifically, no hemorrhage, hydrocephalus, mass lesion, acute infarction, or significant intracranial injury.  No acute calvarial abnormality. Visualized paranasal sinuses and mastoids clear.  Orbital soft tissues unremarkable.  IMPRESSION: No acute intracranial abnormality.  Original Report Authenticated By: Cyndie Chime, M.D.    1. Confusion     MDM   52 yoM with waxing and waning acute  confusion. Patient is at baseline at this time. His last been reviewed and remarkable for slightly elevated white blood cell count 11.1. Chest x-ray improved from previous, UA unremarkable. CT head unremarkable. Doubt metabolic cause. Will consult neurology.  1353 D/W Neurology who will see the patient in the ED.  1529 Neurology PA has assessed pt in the ED. MRI brain ordered, TIA w/u initiated. [facility]     Forbes Cellar, MD 10/23/11 1537  Forbes Cellar, MD 10/23/11 [phone]

## 2011-10-23 NOTE — ED Notes (Signed)
Transferred to [place] via [facility] on stable condition

## 2011-10-23 NOTE — H&P (Signed)
PCP:   Marga Melnick, MD, MD   Chief Complaint:  Confusion  HPI: Patient is a 77 year old white male in excellent health who actively exercises and is not on any medications. He is followed closely by his primary care physician and has been told that his blood pressure and cholesterol are well controlled. He is reportedly has no diabetes. He is been in his usual state of health until this morning when his wife noted that he was confused and he seemed to have trouble word finding. This lasted for just a few minutes and the patient return back to full normal baseline. The patient and his wife were quite concerned and so she brought him into the emergency room.  In emergency room it was noted that his initial blood pressure was elevated with a systolic of 200, There is a moderate suspicion for a CVA. Initial CAT scan of the head was unremarkable and neurology was counseled for a stroke workup. Dopplers and echocardiogram were able to be done. Dopplers preliminary looks to be unremarkable for any carotid artery stenosis. Echocardiogram noted to have grade 1 diastolic dysfunction. MRI was done which preliminary notes no signs of acute infarct. Patient's blood pressure has remained elevated with a systolic ranging from the 160s to 180s.  His lab work is otherwise unremarkable. Neurology recommended that the patient he kept in-house to complete stroke versus TIA workup  Review of Systems:   when I saw the patient, he states that he is doing well. He feels that he is at his baseline. He denies any headaches, vision changes, chest pain, palpitations, shortness of breath, wheeze, cough, abdominal pain, hematuria, dysuria, constipation, diarrhea, focal extremity numbness weakness or pain. No deficit he said occurred at the time of the incident which was word finding and some confusion and recognizing his wife. His wife confirms this and also confirms that he is back to normal has been that way ever since this  morning  Past Medical History: Past Medical History  Diagnosis Date  . Interstitial cystitis     presentation in 1997 w/ abdominal pain/distention, Dr. Eudelia Bunch, [facility]  . Colonic polyp     Dr.John hayes  . BPH (benign prostatic hypertrophy)   . Skin cancer     basal cell, Dr.Dan Yetta Barre  . Hyperlipidemia   . Fasting hyperglycemia   . Nephrolithiasis     x 2   Past Surgical History  Procedure Date  . Cystoscopy     2004/2011; Hospitalized 1997 w/ I.C.  . Colonoscopy w/ polypectomy     and esophageal dilation 01/2008 by Dr.Hayes; bladder stones s/p urethral dilation 10/2007 by Dr. Logan Bores  . Tonsillectomy   . Septoplasty   . Laparoscopic cholecystectomy 07/14/11    Medications: Prior to Admission medications   Medication Sig Start Date End Date Taking? Authorizing Provider  fexofenadine (ALLEGRA) 180 MG tablet Take 180 mg by mouth daily.     Yes Historical Provider, MD  fluticasone (FLONASE) 50 MCG/ACT nasal spray USE 1 SPRAY IN EACH NOSTRIL TWICE DAILY AS NEEDED 08/30/11  Yes Pecola Lawless, MD  simethicone (MYLICON) 80 MG chewable tablet Chew 80 mg by mouth every 6 (six) hours as needed.     Yes Historical Provider, MD    Allergies:   Allergies  Allergen Reactions  . Penicillins     hives  . Sulfonamide Derivatives     Scrotal exfoliative rash  . Tamsulosin     REACTION: ? caused palpitations    Social  History:  reports that he quit smoking about 51 years ago. He does not have any smokeless tobacco history on file. He reports that he does not drink alcohol or use illicit drugs.  Patient lives at home with his wife and is extremely active and physically fit  Family History: Family History  Problem Relation Age of Onset  . Stroke Father     cva  . Hypertension Father   . Coronary artery disease Father   . Stroke Mother   . Hypertension Brother     Physical Exam: Filed Vitals:   10/23/11 1147 10/23/11 1412 10/23/11 1540 10/23/11 1921  BP: 201/95 189/96 164/96  182/83  Pulse: 80 70 75 83  Temp: 97.5 F (36.4 C)  98.8 F (37.1 C) 98.9 F (37.2 C)  TempSrc: Oral  Oral Oral  Resp: 18 20 16 20   SpO2: 100% 100% 100% 100%   General: Alert and oriented x3, no apparent distress, looks younger than stated age HEENT: Normocephalic, atraumatic, mucous from his are moist, cranial nerves II through XII are intact, no carotid bruits Cardiovascular: Regular rate and rhythm, S1-S2, soft 2/6 systolic ejection murmur Lungs:" Bilaterally Abdomen: Soft, nontender, nondistended, positive bowel sounds Extremities: No clubbing cyanosis or edema, 2+ pulses Neuro: No dysmetria. Flexion, extension and grip are symmetric and normal upper and lower. Negative for Babinski sign. Sensation appears to be intact fully. Overall, normal neurologic exam with no deficits.   Labs on Admission:   [facility] 10/23/11 1210  NA 138  K 4.2  CL 101  CO2 25  GLUCOSE 100*  BUN 11  CREATININE 0.98  CALCIUM 10.5  MG --  PHOS --    Basename 10/23/11 1210  AST 23  ALT 15  ALKPHOS 89  BILITOT 0.4  PROT 7.9  ALBUMIN 4.6     Basename 10/23/11 1210  WBC 11.1*  NEUTROABS 6.6  HGB 17.3*  HCT 50.3  MCV 85.8  PLT 222    Radiological Exams on Admission: Dg Chest 2 View 10/23/2011  IMPRESSION: No active cardiopulmonary disease.  Chronic granulomas on the left without change.   Ct Head Wo Contrast 10/23/2011   IMPRESSION: No acute intracranial abnormality.  Preliminary 2-D echo: Grade 1 diastolic dysfunction.  Preliminary MRI: No evidence of acute infarct Preliminary carotid Doppler report: No signs of significant carotid artery stenosis bilaterally.  Assessment/Plan Present on Admission:  .Malignant hypertension .Altered mental status: Given the fact the patient is closely followed by his PCP and has minimally elevated blood pressures and reportedly no signs of diabetes or hyperlipidemia, I'm concerned that the sudden onset of malignant hypertension could be a TIA.  Preliminary MRI looks to be negative but we'll go ahead and finish workup with an MRA of the neck and head. Neurology is also planning on getting an EEG. Regardless of whether or not he has had a stroke with this TIA, the outcome will be the same. He has no permanent deficits at all. He looks to be at his baseline. The most important factor will be in determining the cause of this. Please note the patient is a TIA workup-therefore we'll plan to transfer him to [place], and further dedicated stroke center. Stroke team will follow the patient the morning.  Diastolic dysfunction: Noted on echocardiogram. We'll check BNP and educate patient.  I discussed with the patient and wife and they confirmed the patient is a full code. The plan will be for the patient to return home without any additional home health needs. I  suspected likely he will be able to be discharged tomorrow-Thursday  Saraphina Lauderbaugh K 10/23/2011, 7:33 PM

## 2011-10-23 NOTE — Consult Note (Signed)
Reason for Consult: transient confusion   CC: transient confusion  HPI: Mark Copeland is an 77 y.o. male  has a past medical history of Interstitial cystitis; Colonic polyp; BPH (benign prostatic hypertrophy); Skin cancer; Hyperlipidemia; Fasting hyperglycemia; and Nephrolithiasis. Patient awoke this morning at his baseline. He went to [facility] for breakfast and drove home to work in the yard.  While working in the yard he felt "funny".  He walked into the house and had a sudden period of confusion.  He knew where he was but did not know his wife's name, why he came inside the house or what he was doing or names of some of his closest friends.  His wife noted this confusion.  HE also made a remark that his right hand was tingling but states this is issue that he has had for a long time.  States his right fingers will start to tingle (starts in little finger and moves to ring ->middle-> index).  Due to constellation of symptoms his wife brought him to ED.   He is on no ASA or medications at home per wife.  At present time his BP is 160-190/90-102 but wife notes he does have "white coat syndrome"  Past Medical History  Diagnosis Date  . Interstitial cystitis     presentation in 1997 w/ abdominal pain/distention, Dr. Eudelia Bunch, [facility]  . Colonic polyp     Dr.John hayes  . BPH (benign prostatic hypertrophy)   . Skin cancer     basal cell, Dr.Dan Yetta Barre  . Hyperlipidemia   . Fasting hyperglycemia   . Nephrolithiasis     x 2    Past Surgical History  Procedure Date  . Cystoscopy     2004/2011; Hospitalized 1997 w/ I.C.  . Colonoscopy w/ polypectomy     and esophageal dilation 01/2008 by Dr.Hayes; bladder stones s/p urethral dilation 10/2007 by Dr. Logan Bores  . Tonsillectomy   . Septoplasty   . Laparoscopic cholecystectomy 07/14/11    Family History  Problem Relation Age of Onset  . Stroke Father     cva  . Hypertension Father   . Coronary artery disease Father   . Stroke Mother   .  Hypertension Brother     Social History:  reports that he quit smoking about 51 years ago. He does not have any smokeless tobacco history on file. He reports that he does not drink alcohol or use illicit drugs.  Allergies  Allergen Reactions  . Penicillins     hives  . Sulfonamide Derivatives     Scrotal exfoliative rash  . Tamsulosin     REACTION: ? caused palpitations    Medications: Prior to Admission:  (Not in a hospital admission) Scheduled:    Omeprazole  Review of Systems - General ROS: negative for - chills, fatigue, fever or hot flashes Hematological and Lymphatic ROS: negative for - bruising, fatigue, jaundice or pallor Endocrine ROS: negative for - hair pattern changes, hot flashes, mood swings or skin changes Respiratory ROS: negative for - cough, hemoptysis, orthopnea or wheezing Cardiovascular ROS: negative for - dyspnea on exertion, orthopnea, palpitations or shortness of breath Gastrointestinal ROS: negative for - abdominal pain, appetite loss, blood in stools, diarrhea or hematemesis Musculoskeletal ROS: negative for - joint pain, joint stiffness, joint swelling or muscle pain Neurological ROS: see HPI Dermatological ROS: negative for dry skin, pruritus and rash   Blood pressure 189/96, pulse 70, temperature 97.5 F (36.4 C), temperature source Oral, resp. rate 20, SpO2  100.00%.   Neurologic Examination: Mental Status: Alert, oriented, thought content appropriate.  Speech fluent without evidence of aphasia. Able to follow 3 step commands without difficulty. Cranial Nerves: II-Visual fields grossly intact. III/IV/VI-Extraocular movements intact.  Pupils reactive bilaterally. V/VII-Smile symmetric VIII-grossly intact IX/X-normal gag XI-bilateral shoulder shrug XII-midline tongue extension Motor: 5/5 bilaterally with normal tone and bulk Sensory: Pinprick and light touch intact throughout, bilaterally Deep Tendon Reflexes: 2+ and symmetric  throughout Plantars: Downgoing bilaterally Cerebellar: Normal finger-to-nose, normal rapid alternating movements and normal heel-to-shin test.  Normal gait and station.   Lab Results  Component Value Date/Time   CHOL 182 10/11/2010  9:29 AM    Results for orders placed during the hospital encounter of 10/23/11 (from the past 48 hour(s))  GLUCOSE, CAPILLARY     Status: Normal   Collection Time   10/23/11 11:56 AM      Component Value Range Comment   Glucose-Capillary 90  70 - 99 (mg/dL)   CBC     Status: Abnormal   Collection Time   10/23/11 12:10 PM      Component Value Range Comment   WBC 11.1 (*) 4.0 - 10.5 (K/uL)    RBC 5.86 (*) 4.22 - 5.81 (MIL/uL)    Hemoglobin 17.3 (*) 13.0 - 17.0 (g/dL)    HCT 16.1  09.6 - 04.5 (%)    MCV 85.8  78.0 - 100.0 (fL)    MCH 29.5  26.0 - 34.0 (pg)    MCHC 34.4  30.0 - 36.0 (g/dL)    RDW 40.9  81.1 - 91.4 (%)    Platelets 222  150 - 400 (K/uL)   DIFFERENTIAL     Status: Normal   Collection Time   10/23/11 12:10 PM      Component Value Range Comment   Neutrophils Relative 60  43 - 77 (%)    Neutro Abs 6.6  1.7 - 7.7 (K/uL)    Lymphocytes Relative 28  12 - 46 (%)    Lymphs Abs 3.1  0.7 - 4.0 (K/uL)    Monocytes Relative 6  3 - 12 (%)    Monocytes Absolute 0.7  0.1 - 1.0 (K/uL)    Eosinophils Relative 4  0 - 5 (%)    Eosinophils Absolute 0.5  0.0 - 0.7 (K/uL)    Basophils Relative 1  0 - 1 (%)    Basophils Absolute 0.1  0.0 - 0.1 (K/uL)   COMPREHENSIVE METABOLIC PANEL     Status: Abnormal   Collection Time   10/23/11 12:10 PM      Component Value Range Comment   Sodium 138  135 - 145 (mEq/L)    Potassium 4.2  3.5 - 5.1 (mEq/L)    Chloride 101  96 - 112 (mEq/L)    CO2 25  19 - 32 (mEq/L)    Glucose, Bld 100 (*) 70 - 99 (mg/dL)    BUN 11  6 - 23 (mg/dL)    Creatinine, Ser 7.82  0.50 - 1.35 (mg/dL)    Calcium 95.6  8.4 - 10.5 (mg/dL)    Total Protein 7.9  6.0 - 8.3 (g/dL)    Albumin 4.6  3.5 - 5.2 (g/dL)    AST 23  0 - 37 (U/L)     ALT 15  0 - 53 (U/L)    Alkaline Phosphatase 89  39 - 117 (U/L)    Total Bilirubin 0.4  0.3 - 1.2 (mg/dL)    GFR calc non Af Amer 78 (*) >  90 (mL/min)    GFR calc Af Amer >90  >90 (mL/min)   URINALYSIS, ROUTINE W REFLEX MICROSCOPIC     Status: Normal   Collection Time   10/23/11  1:18 PM      Component Value Range Comment   Color, Urine YELLOW  YELLOW     APPearance CLEAR  CLEAR     Specific Gravity, Urine 1.009  1.005 - 1.030     pH 7.0  5.0 - 8.0     Glucose, UA NEGATIVE  NEGATIVE (mg/dL)    Hgb urine dipstick NEGATIVE  NEGATIVE     Bilirubin Urine NEGATIVE  NEGATIVE     Ketones, ur NEGATIVE  NEGATIVE (mg/dL)    Protein, ur NEGATIVE  NEGATIVE (mg/dL)    Urobilinogen, UA 0.2  0.0 - 1.0 (mg/dL)    Nitrite NEGATIVE  NEGATIVE     Leukocytes, UA NEGATIVE  NEGATIVE  MICROSCOPIC NOT DONE ON URINES WITH NEGATIVE PROTEIN, BLOOD, LEUKOCYTES, NITRITE, OR GLUCOSE <1000 mg/dL.    Dg Chest 2 View  10/23/2011  *RADIOLOGY REPORT*  Clinical Data: Altered mental status.  Dizziness.  CHEST - 2 VIEW  Comparison: 04/13/2009, 72,009, 04/02/2007  Findings: Heart size is normal.  Mediastinal shadows are normal except for unfolding of the aorta.  The lungs are clear except for a 5 mm chronic unchanged nodule at the left base laterally and a few 3 mm granulomas elsewhere in the left perihilar region.  No evidence of mass, infiltrate, collapse or effusion.  Ordinary degenerative changes effect the spine.    IMPRESSION: No active cardiopulmonary disease.  Chronic granulomas on the left without change.  Original Report Authenticated By: Thomasenia Sales, M.D.   Ct Head Wo Contrast  10/23/2011  *RADIOLOGY REPORT*  Clinical Data: Altered mental status.  CT HEAD WITHOUT CONTRAST  Technique:  Contiguous axial images were obtained from the base of the skull through the vertex without contrast.  Comparison: None.  Findings: No acute intracranial abnormality.  Specifically, no hemorrhage, hydrocephalus, mass lesion, acute  infarction, or significant intracranial injury.  No acute calvarial abnormality. Visualized paranasal sinuses and mastoids clear.  Orbital soft tissues unremarkable.    IMPRESSION: No acute intracranial abnormality.  Original Report Authenticated By: Cyndie Chime, M.D.     Assessment/Plan:  77 YO male who had sudden onset of confusion lasting for 15-20 minutes.  Now fully resolved.  He had associated right hand tingling however states this issue is not new.  CT head is negative for acute infarct. Given age and sudden onset cannot R/O TIA/CVA.    Recommend: ASA 81 mg PO q day MRI/MRA head 2Decho Carotid dopplers FLP, HBA1c Observation until diagnostic tests done.  If all test are negative no further work-up and have follow up out patient with neurologist out patient.  EEG  Felicie Morn PA-C [facility] [phone]  10/23/2011, 2:18 PM

## 2011-10-23 NOTE — ED Notes (Signed)
Pt was drinking coffee at 10:55 this am and began having difficulty remembering peoples names and having trouble coming up with words. Pt is alert. Equal grips bilaterally, no facial droop noted. Bp 201/95.

## 2011-10-23 NOTE — Progress Notes (Signed)
*  PRELIMINARY RESULTS* Carotid dopplers performed. Preliminary findings showed no ICA stenosis. Antegrade vertebral artery flow bilateral.  Mark Copeland 10/23/2011, 4:37 PM

## 2011-10-23 NOTE — ED Notes (Signed)
Neurologist at bedside.  Or for swallow study.

## 2011-10-23 NOTE — ED Notes (Signed)
Patient transported to CT 

## 2011-10-23 NOTE — ED Notes (Signed)
Neurologist at bedside. 

## 2011-10-23 NOTE — ED Notes (Signed)
Report given to Lafferty rn keji waiting fro transportation

## 2011-10-23 NOTE — ED Notes (Signed)
Addendum: pt signed emtala form prior to transfer

## 2011-10-23 NOTE — Progress Notes (Signed)
  Echocardiogram 2D Echocardiogram has been performed.  Mark Copeland [facility] 10/23/2011, 4:11 PM

## 2011-10-23 NOTE — ED Notes (Signed)
Patient transported to MRI 

## 2011-10-23 NOTE — ED Notes (Signed)
Remains at MRI. 

## 2011-10-24 ENCOUNTER — Observation Stay (HOSPITAL_COMMUNITY): Payer: Medicare Other

## 2011-10-24 ENCOUNTER — Encounter (HOSPITAL_COMMUNITY): Payer: Self-pay | Admitting: General Practice

## 2011-10-24 LAB — LIPID PANEL
HDL: 48 mg/dL (ref >39)
LDL Cholesterol: 89 mg/dL (ref 0–99)
Triglycerides: 104 mg/dL (ref <150)
VLDL: 21 mg/dL (ref 0–40)

## 2011-10-24 LAB — RAPID URINE DRUG SCREEN, HOSP PERFORMED: Amphetamines: NOT DETECTED

## 2011-10-24 MED ORDER — GADOBENATE DIMEGLUMINE 529 MG/ML IV SOLN
17.0000 mL | Freq: Once | INTRAVENOUS | Status: AC
  Administered 2011-10-24: 17 mL via INTRAVENOUS

## 2011-10-24 MED ORDER — ASPIRIN 81 MG PO TBEC: 81.0000 mg | DELAYED_RELEASE_TABLET | Freq: Every day | ORAL | Status: AC

## 2011-10-24 NOTE — Progress Notes (Signed)
Retro ur ins review. 

## 2011-10-24 NOTE — Progress Notes (Signed)
Discharge Summary  Mark Copeland MR#: [id number]  DOB:23-Apr-1934  Date of Admission: 10/23/2011 Date of Discharge: 10/24/2011  Patient's PCP: Marga Melnick, MD, MD  Attending Physician:Emillio Ngo  Consults: Neurology:  Discharge Diagnoses: Principal Problem:  *Altered mental status Active Problems:  Malignant hypertension   Brief Admitting History and Physical 77 year old male patient who is physically active, closely followed by primary care physician and told to have controlled blood pressure and cholesterol presented with acute onset of confusion and elevated systolic blood pressure of 200 mm of mercury.  Discharge Medications Current Discharge Medication List    START taking these medications   Details  aspirin EC 81 MG EC tablet Take 1 tablet (81 mg total) by mouth daily.      CONTINUE these medications which have NOT CHANGED   Details  fexofenadine (ALLEGRA) 180 MG tablet Take 180 mg by mouth daily.      fluticasone (FLONASE) 50 MCG/ACT nasal spray USE 1 SPRAY IN EACH NOSTRIL TWICE DAILY AS NEEDED Qty: 16 g, Refills: 2    simethicone (MYLICON) 80 MG chewable tablet Chew 80 mg by mouth every 6 (six) hours as needed.          Hospital Course: Altered mental status: His mental status resolved spontaneously in a short period of time. He initially presented to the [facility] along emergency department. His presentation is suspicious for TIA versus rule out CVA. He was transferred to [facility] where a stroke workup was completed and unremarkable. His 2-D echo shows mild diastolic dysfunction. EEG has been done and report is pending. Neurology service has seen him this morning and cleared her for discharge and they will read his EEG and if abnormal will call the patient with the results. If he has repeated episodes of alteration in mental status, then recommend outpatient followup with neurology.  Present on Admission:  .Malignant hypertension: Blood  pressure is uncontrolled this morning. Recommend no medications on discharge. Patient and his wife indicate that they have a blood pressure machine at home and advised to check his blood pressures daily and call his primary care physician if the blood pressures are high and make an appointment to be seen by his primary care physician so that the blood pressure can be checked early next week.   Dry cough: Patient indicates that he was on oxygen at the [facility] along emergency room, via nasal cannula. His throat felt dry and he's been having some hacking dry cough since then. He denies any fevers or chills, dyspnea or chest pain, sore throat or earache or myalgias. She's advised to use as needed Robitussin cough syrup and seek medical attention if there is any worsening of his cough or he develops fever or chills or dyspnea. He and his wife verbalize understanding.    Day of Discharge BP 148/91  Pulse 67  Temp(Src) 98.1 F (36.7 C) (Oral)  Resp 17  Ht 5\' 10"  (1.778 m)  Wt 76.5 kg (168 lb 10.4 oz)  BMI 24.20 kg/m2  SpO2 100%  General exam: Comfortable Respiratory system: Clear Cardiovascular system: S1 and S2 heard, regular. No JVD or murmurs Gastrointestinal system: Abdomen is nondistended, soft and normal bowel sounds heard. Central nervous system: Alert and oriented. No focal neurological deficits. Extremities: Symmetrical 5 x 5 power.  Results for orders placed during the hospital encounter of 10/23/11 (from the past 48 hour(s))  GLUCOSE, CAPILLARY     Status: Normal   Collection Time   10/23/11 11:56 AM  Component Value Range Comment   Glucose-Capillary 90  70 - 99 (mg/dL)   CBC     Status: Abnormal   Collection Time   10/23/11 12:10 PM      Component Value Range Comment   WBC 11.1 (*) 4.0 - 10.5 (K/uL)    RBC 5.86 (*) 4.22 - 5.81 (MIL/uL)    Hemoglobin 17.3 (*) 13.0 - 17.0 (g/dL)    HCT 16.1  09.6 - 04.5 (%)    MCV 85.8  78.0 - 100.0 (fL)    MCH 29.5  26.0 - 34.0 (pg)     MCHC 34.4  30.0 - 36.0 (g/dL)    RDW 40.9  81.1 - 91.4 (%)    Platelets 222  150 - 400 (K/uL)   DIFFERENTIAL     Status: Normal   Collection Time   10/23/11 12:10 PM      Component Value Range Comment   Neutrophils Relative 60  43 - 77 (%)    Neutro Abs 6.6  1.7 - 7.7 (K/uL)    Lymphocytes Relative 28  12 - 46 (%)    Lymphs Abs 3.1  0.7 - 4.0 (K/uL)    Monocytes Relative 6  3 - 12 (%)    Monocytes Absolute 0.7  0.1 - 1.0 (K/uL)    Eosinophils Relative 4  0 - 5 (%)    Eosinophils Absolute 0.5  0.0 - 0.7 (K/uL)    Basophils Relative 1  0 - 1 (%)    Basophils Absolute 0.1  0.0 - 0.1 (K/uL)   COMPREHENSIVE METABOLIC PANEL     Status: Abnormal   Collection Time   10/23/11 12:10 PM      Component Value Range Comment   Sodium 138  135 - 145 (mEq/L)    Potassium 4.2  3.5 - 5.1 (mEq/L)    Chloride 101  96 - 112 (mEq/L)    CO2 25  19 - 32 (mEq/L)    Glucose, Bld 100 (*) 70 - 99 (mg/dL)    BUN 11  6 - 23 (mg/dL)    Creatinine, Ser 7.82  0.50 - 1.35 (mg/dL)    Calcium 95.6  8.4 - 10.5 (mg/dL)    Total Protein 7.9  6.0 - 8.3 (g/dL)    Albumin 4.6  3.5 - 5.2 (g/dL)    AST 23  0 - 37 (U/L)    ALT 15  0 - 53 (U/L)    Alkaline Phosphatase 89  39 - 117 (U/L)    Total Bilirubin 0.4  0.3 - 1.2 (mg/dL)    GFR calc non Af Amer 78 (*) >90 (mL/min)    GFR calc Af Amer >90  >90 (mL/min)   URINALYSIS, ROUTINE W REFLEX MICROSCOPIC     Status: Normal   Collection Time   10/23/11  1:18 PM      Component Value Range Comment   Color, Urine YELLOW  YELLOW     APPearance CLEAR  CLEAR     Specific Gravity, Urine 1.009  1.005 - 1.030     pH 7.0  5.0 - 8.0     Glucose, UA NEGATIVE  NEGATIVE (mg/dL)    Hgb urine dipstick NEGATIVE  NEGATIVE     Bilirubin Urine NEGATIVE  NEGATIVE     Ketones, ur NEGATIVE  NEGATIVE (mg/dL)    Protein, ur NEGATIVE  NEGATIVE (mg/dL)    Urobilinogen, UA 0.2  0.0 - 1.0 (mg/dL)    Nitrite NEGATIVE  NEGATIVE  Leukocytes, UA NEGATIVE  NEGATIVE  MICROSCOPIC NOT DONE ON URINES  WITH NEGATIVE PROTEIN, BLOOD, LEUKOCYTES, NITRITE, OR GLUCOSE <1000 mg/dL.  HEMOGLOBIN A1C     Status: Abnormal   Collection Time   10/23/11  3:37 PM      Component Value Range Comment   Hemoglobin A1C 5.8 (*) <5.7 (%)    Mean Plasma Glucose 120 (*) <117 (mg/dL)   CARDIAC PANEL(CRET KIN+CKTOT+MB+TROPI)     Status: Normal   Collection Time   10/23/11  8:15 PM      Component Value Range Comment   Total CK 85  7 - 232 (U/L)    CK, MB 2.0  0.3 - 4.0 (ng/mL)    Troponin I <0.30  <0.30 (ng/mL)    Relative Index RELATIVE INDEX IS INVALID  0.0 - 2.5    CBC     Status: Normal   Collection Time   10/23/11 10:37 PM      Component Value Range Comment   WBC 10.3  4.0 - 10.5 (K/uL)    RBC 5.22  4.22 - 5.81 (MIL/uL)    Hemoglobin 15.8  13.0 - 17.0 (g/dL)    HCT 16.1  09.6 - 04.5 (%)    MCV 86.8  78.0 - 100.0 (fL)    MCH 30.3  26.0 - 34.0 (pg)    MCHC 34.9  30.0 - 36.0 (g/dL)    RDW 40.9  81.1 - 91.4 (%)    Platelets 202  150 - 400 (K/uL)   CREATININE, SERUM     Status: Abnormal   Collection Time   10/23/11 10:37 PM      Component Value Range Comment   Creatinine, Ser 1.05  0.50 - 1.35 (mg/dL)    GFR calc non Af Amer 67 (*) >90 (mL/min)    GFR calc Af Amer 78 (*) >90 (mL/min)   PRO B NATRIURETIC PEPTIDE     Status: Normal   Collection Time   10/23/11 10:38 PM      Component Value Range Comment   Pro B Natriuretic peptide (BNP) 65.7  0 - 450 (pg/mL)   URINE RAPID DRUG SCREEN (HOSP PERFORMED)     Status: Normal   Collection Time   10/23/11 11:22 PM      Component Value Range Comment   Opiates NONE DETECTED  NONE DETECTED     Cocaine NONE DETECTED  NONE DETECTED     Benzodiazepines NONE DETECTED  NONE DETECTED     Amphetamines NONE DETECTED  NONE DETECTED     Tetrahydrocannabinol NONE DETECTED  NONE DETECTED     Barbiturates NONE DETECTED  NONE DETECTED    LIPID PANEL     Status: Normal   Collection Time   10/24/11  6:31 AM      Component Value Range Comment   Cholesterol 158  0 -  200 (mg/dL)    Triglycerides 782  <150 (mg/dL)    HDL 48  >95 (mg/dL)    Total CHOL/HDL Ratio 3.3      VLDL 21  0 - 40 (mg/dL)    LDL Cholesterol 89  0 - 99 (mg/dL)     Dg Chest 2 View  62/13/0865  *RADIOLOGY REPORT*  Clinical Data: Altered mental status.  Dizziness.  CHEST - 2 VIEW  Comparison: 04/13/2009, 72,009, 04/02/2007  IMPRESSION: No active cardiopulmonary disease.  Chronic granulomas on the left without change.  Original Report Authenticated By: Thomasenia Sales, M.D.   Ct Head Wo Contrast  10/23/2011  *  RADIOLOGY REPORT*  Clinical Data: Altered mental status.  CT HEAD WITHOUT CONTRAST  IMPRESSION: No acute intracranial abnormality.  Original Report Authenticated By: Cyndie Chime, M.D.   Mr Angiogram Neck W Wo Contrast  10/24/2011  *RADIOLOGY REPORT*  Clinical Data:  77 year old male with right side weakness and confusion.  Contrast: 17mL MULTIHANCE GADOBENATE DIMEGLUMINE 529 MG/ML IV SOLN,  Comparison: MRI HEAD WITHOUT AND WITH CONTRAST 10/23/2011.  MRA NECK WITHOUT AND WITH CONTRAST  IMPRESSION: 1.  No intracranial stenosis or major branch occlusion. 2.  Tortuous vertebrobasilar system.  Vascular ectasia at the basilar tip is in fact related to the left PCA P1 segment, and this corresponds to the dilated perivascular spaces described in the left thalamus on MRI. 3.  Furthermore, there is a small 1 x 2 mm infundibulum, or less likely small saccular aneurysm, of this tortuous left P1 segment. Given the small size of this lesion, surveillance MRA (annual or biannual) may be most appropriate.  Original Report Authenticated By: Harley Hallmark, M.D.   Mr Brain Wo Contrast  10/24/2011  *RADIOLOGY REPORT*  Clinical Data: 77 year old male with acute onset of confusion and right upper extremity tingling.  Possible stroke.  MRI HEAD WITHOUT CONTRAST  IMPRESSION: 1. No acute intracranial abnormality. 2.  Dolichoectasia of the basilar tip is associated with CSF isointense cystic areas in the  medial left thalamus - favor dilated perivascular spaces.  Original Report Authenticated By: Harley Hallmark, M.D.   Mr Maxine Glenn Head/brain Wo Cm  10/24/2011  *RADIOLOGY REPORT*  Clinical Data:  77 year old male with right side weakness and confusion.  Contrast: 17mL MULTIHANCE GADOBENATE DIMEGLUMINE 529 MG/ML IV SOLN,  Comparison: MRI HEAD WITHOUT AND WITH CONTRAST 10/23/2011.  MRA NECK WITHOUT AND WITH CONTRAST  IMPRESSION: 1.  No intracranial stenosis or major branch occlusion. 2.  Tortuous vertebrobasilar system.  Vascular ectasia at the basilar tip is in fact related to the left PCA P1 segment, and this corresponds to the dilated perivascular spaces described in the left thalamus on MRI. 3.  Furthermore, there is a small 1 x 2 mm infundibulum, or less likely small saccular aneurysm, of this tortuous left P1 segment. Given the small size of this lesion, surveillance MRA (annual or biannual) may be most appropriate.  Original Report Authenticated By: Harley Hallmark, M.D.     Disposition: Discharged home in stable condition  Diet: Heart healthy  Activity: Increase activity gradually  Follow-up Appts: Discharge Orders    Future Orders Please Complete By Expires   Diet - low sodium heart healthy      Increase activity slowly      Discharge instructions      Comments:   Check blood pressure at home daily (has BP machine) and call primary M.D. if blood pressure is consistently higher than 160/90 mmHg.      TESTS THAT NEED FOLLOW-UP None  Time spent on discharge, talking to the patient, and coordinating care: 30 mins.   SignedMarcellus Scott, MD 10/24/2011, 10:37 AM

## 2011-10-24 NOTE — Progress Notes (Signed)
*  PRELIMINARY RESULTS* EEG has been performed.  Markus Jarvis 10/24/2011, 9:38 AM

## 2011-10-24 NOTE — Progress Notes (Signed)
   CARE MANAGEMENT NOTE 10/24/2011  Patient:  Mark Copeland, Mark Copeland   Account Number:  [id number]  Date Initiated:  10/24/2011  Documentation initiated by:  [facility]  Subjective/Objective Assessment:   In for observation with acute onset of confusion. Lives with spouse.     Action/Plan:   return home   Anticipated DC Date:  10/24/2011   Anticipated DC Plan:  HOME/SELF CARE      DC Planning Services  CM consult      Choice offered to / List presented to:             Status of service:  Completed, signed off Medicare Important Message given?   (If response is "NO", the following Medicare IM given date fields will be blank) Date Medicare IM given:   Date Additional Medicare IM given:    Discharge Disposition:  HOME/SELF CARE  Per UR Regulation:    Comments:  PCP Dr. Marga Melnick

## 2011-10-24 NOTE — Progress Notes (Signed)
Subjective: No complaints  Objective: Vital signs in last 24 hours: Temp:  [97.5 F (36.4 C)-98.9 F (37.2 C)] 98.1 F (36.7 C) (12/13 0600) Pulse Rate:  [70-95] 90  (12/13 0600) Resp:  [16-20] 19  (12/13 0600) BP: (145-214)/(78-123) 145/85 mmHg (12/13 0600) SpO2:  [99 %-100 %] 99 % (12/13 0600) Weight:  [76.5 kg (168 lb 10.4 oz)] 168 lb 10.4 oz (76.5 kg) (12/12 2204)  Intake/Output from previous day:   Intake/Output this shift:   Nutritional status: Sodium Restricted  Past Medical History  Diagnosis Date  . Interstitial cystitis     presentation in 1997 w/ abdominal pain/distention, Dr. Eudelia Bunch, [facility]  . Colonic polyp     Dr.John hayes  . BPH (benign prostatic hypertrophy)   . Skin cancer     basal cell, Dr.Dan Yetta Barre  . Hyperlipidemia   . Fasting hyperglycemia   . Nephrolithiasis     x 2    Neurologic Exam: Mental Status: Alert, oriented, thought content appropriate.  Speech fluent without evidence of aphasia. Able to follow 3 step commands without difficulty. Cranial Nerves: II-Visual fields grossly intact. III/IV/VI-Extraocular movements intact.  Pupils reactive bilaterally. V/VII-Smile symmetric VIII-grossly intact IX/X-normal gag XI-bilateral shoulder shrug XII-midline tongue extension Motor: 5/5 bilaterally with normal tone and bulk Sensory: Pinprick and light touch intact throughout, bilaterally Deep Tendon Reflexes: 2+ and symmetric throughout Plantars: Downgoing bilaterally Cerebellar: Normal finger-to-nose, normal rapid alternating movements and normal heel-to-shin test.   Lab Results: Lab Results  Component Value Date/Time   CHOL 158 10/24/2011  6:31 AM   Lipid Panel  Basename 10/24/11 0631  CHOL 158  TRIG 104  HDL 48  CHOLHDL 3.3  VLDL 21  LDLCALC 89    Studies/Results: Dg Chest 2 View  10/23/2011  *RADIOLOGY REPORT*  Clinical Data: Altered mental status.  Dizziness.  CHEST - 2 VIEW  Comparison: 04/13/2009, 72,009, 04/02/2007   Findings: Heart size is normal.  Mediastinal shadows are normal except for unfolding of the aorta.  The lungs are clear except for a 5 mm chronic unchanged nodule at the left base laterally and a few 3 mm granulomas elsewhere in the left perihilar region.  No evidence of mass, infiltrate, collapse or effusion.  Ordinary degenerative changes effect the spine.  IMPRESSION: No active cardiopulmonary disease.  Chronic granulomas on the left without change.  Original Report Authenticated By: Thomasenia Sales, M.D.   Ct Head Wo Contrast  10/23/2011  *RADIOLOGY REPORT*  Clinical Data: Altered mental status.  CT HEAD WITHOUT CONTRAST  Technique:  Contiguous axial images were obtained from the base of the skull through the vertex without contrast.  Comparison: None.  Findings: No acute intracranial abnormality.  Specifically, no hemorrhage, hydrocephalus, mass lesion, acute infarction, or significant intracranial injury.  No acute calvarial abnormality. Visualized paranasal sinuses and mastoids clear.  Orbital soft tissues unremarkable.  IMPRESSION: No acute intracranial abnormality.  Original Report Authenticated By: Cyndie Chime, M.D.     Mr Brain Wo Contrast  10/24/2011  *RADIOLOGY REPORT*  Clinical Data: 77 year old male with acute onset of confusion and right upper extremity tingling.  Possible stroke.  MRI HEAD WITHOUT CONTRAST  Technique:  Multiplanar, multiecho pulse sequences of the brain and surrounding structures were obtained according to standard protocol without intravenous contrast.  Comparison: Head CT 10/23/2011.  Findings: No restricted diffusion to suggest acute infarction. Major intracranial vascular flow voids are preserved.  Two cystic areas at the anterior left thalamus with mild protrusion into the lateral wall  of the left third ventricle are isointense to CSF.  Adjacent basilar tip with dolichoectasia is present, I favor these cystic areas are dilated perivascular spaces.  No other  intracranial mass lesion.  No ventriculomegaly.  Negative pituitary, cervicomedullary junction and visualized cervical spine. Volume loss of the superior cerebellum is somewhat disproportionate to the rest of the brain. Gray-white matter signal is within normal limits for age throughout the brain.  Visualized bone marrow signal is within normal limits.  Visualized orbit soft tissues are within normal limits.  Visualized paranasal sinuses and mastoids are clear.  Negative visualized scalp soft tissues.    IMPRESSION: 1. No acute intracranial abnormality. 2.  Dolichoectasia of the basilar tip is associated with CSF isointense cystic areas in the medial left thalamus - favor dilated perivascular spaces.  Original Report Authenticated By: Harley Hallmark, M.D.   Mr Maxine Glenn Head/brain Wo Cm  10/24/2011  *RADIOLOGY REPORT*  Clinical Data:  77 year old male with right side weakness and confusion.  Contrast: 17mL MULTIHANCE GADOBENATE DIMEGLUMINE 529 MG/ML IV SOLN,  Comparison: MRI HEAD WITHOUT AND WITH CONTRAST 10/23/2011.  MRA NECK WITHOUT AND WITH CONTRAST  Technique:  Angiographic images of the neck were obtained using MRA technique without and with intravenous contrast.  Carotid stenosis measurements (when applicable) are obtained utilizing NASCET criteria, using the distal internal carotid diameter as the denominator.  Findings:  Precontrast time-of-flight images reveal antegrade flow in both carotid and vertebral arteries throughout the neck.  The right vertebral artery is mildly dominant.  Postcontrast MRA images reveal a three-vessel arch configuration. Great vessel origins are patent without stenosis.  Normal right common carotid artery except for mild tortuosity. Normal right carotid bifurcation.  Minimal irregularity in the posterior distal right ICA bulb.  No cervical right ICA stenosis. There is moderate tortuosity of the distal cervical right ICA.  Normal left common carotid artery except for mild  tortuosity. Normal left carotid bifurcation.  Minimal irregularity of the left ICA bulb.  No cervical left ICA stenosis.  Mild distal tortuosity.  Normal bilateral vertebral artery origins.  The right vertebral artery is mildly dominant throughout the neck.  Mild to moderate tortuosity of the proximal left vertebral artery.  No vertebral artery stenosis in the neck.  Mild tortuosity of the distal vertebral arteries is noted.  Normal left PICA is seen. Intracranial findings are below.    IMPRESSION: 1.  Normal for age neck MRA; minimal carotid atherosclerosis, no stenosis, and mild to moderate vessel tortuosity. 2. See intracranial findings below.    MRA HEAD WITHOUT CONTRAST  Technique: Angiographic images of the Circle of Willis were obtained using MRA technique without  intravenous contrast.  Findings:  Antegrade flow in the posterior circulation with dominant distal right vertebral artery.  Normal PICA vessels. Patent vertebrobasilar junction.  Mild basilar artery dolichoectasia.  The superior cerebellar artery origins and right PCA origin are normal.  Ectasia of previously identified by MRI at the basilar artery tip is in fact mostly related to the left PCA P1 segment.  The left P1 segment also shows evidence of a small infundibulum at the junction with a diminutive left posterior communicating artery (series 3 image 94, series 3 to image 12).  Less likely this finding might represent a tiny 1 x 2 mm aneurysm.  Otherwise the bilateral PCA branches are within normal limits. Right posterior communicating artery is diminutive or absent.  Antegrade flow in both ICA siphons.  Mildly tortuous cavernous ICA segments.  No ICA stenosis.  Normal ophthalmic artery origins; an ectatic left ophthalmic artery.  Patent carotid termini.  A small infundibula of the distal ICA is related to the posterior communicating or anterior choroidal artery origins.  Normal MCA and ACA origins.  Mild left ACA irregularity. Diminutive  anterior communicating artery.  Small median artery of the corpus callosum.  Visualized ACA branches are within normal limits.  MCA M1 segments are normal aside from mild tortuosity. Visualized bilateral MCA branches are within normal limits.    IMPRESSION: 1.  No intracranial stenosis or major branch occlusion. 2.  Tortuous vertebrobasilar system.  Vascular ectasia at the basilar tip is in fact related to the left PCA P1 segment, and this corresponds to the dilated perivascular spaces described in the left thalamus on MRI. 3.  Furthermore, there is a small 1 x 2 mm infundibulum, or less likely small saccular aneurysm, of this tortuous left P1 segment. Given the small size of this lesion, surveillance MRA (annual or biannual) may be most appropriate.  Original Report Authenticated By: Harley Hallmark, M.D.    Medications:  Scheduled:   . aspirin      . aspirin EC  81 mg Oral Daily  . enoxaparin  40 mg Subcutaneous Q24H  . gadobenate dimeglumine  17 mL Intravenous Once  . metoprolol  2.5 mg Intravenous Once    LDL 89 HBa1C 5.8  2 D echo -Study Conclusions  - Left ventricle: The cavity size was normal. There was mild focal basal hypertrophy of the septum. Systolic function was normal. The estimated ejection fraction was in the range of 60% to 65%. Wall motion was normal; there were no regional wall motion abnormalities. Doppler parameters are consistent with abnormal left ventricular relaxation (grade 1 diastolic dysfunction). - Aortic valve: Mild regurgitation.    Assessment/Plan:  76 YO male with transient confusion which has now fully cleared.  His stroke work-up has been negative.  EEG was done this AM with results pending.  Although etiology unclear, Ddx remains TIA v.s TGA v.s less likely seizure.   At his point would recommend continue ASA 81 mg q day.  Ok to D/C home per neurology stand point.  I have discussed with patient he needs to F/u out patient with his primary care  for further work up on his BP and he should be on baby ASA q day.   Neuro S/O  Felicie Morn PA-C [facility] [phone]  10/24/2011, 9:34 AM

## 2011-10-25 NOTE — Procedures (Signed)
EEG ID:  [id number].  HISTORY:  This is a 77 year old man with altered mental status, per report.  MEDICATIONS:  Ativan p.r.n., per report.  CONDITION OF RECORDING:  This 16-lead EEG was performed with the patient in an awake state.  Background rhythm: background patterns in wakefulness were well organized with a well-sustained posterior dominant rhythm of 9.5 Hz, symmetrical and reactive to eye opening and closing.  Abnormal Potentials: no epileptiform activity or focal slowing was noted.  ACTIVATION PROCEDURES:  Hyperventilation did not activate tracing. Photic stimulation did not activate tracing.  EKG:  Single-channel EKG monitoring was unremarkable.  IMPRESSION:  This was a normal EEG.  A normal EEG does not rule out the clinical diagnosis of epilepsy. If clinically warranted, a repeat extended EEG or ambulatory requiring may be obtained for prolonged recording times and sleep capture, which may increase diagnostic yield. Clinical correlation was suggested.          ______________________________ Carmell Austria, MD    [id number] D:  10/24/2011 17:30:03  T:  10/24/2011 19:59:48  Job #:  [id number]

## 2011-10-29 ENCOUNTER — Encounter: Payer: Self-pay | Admitting: Internal Medicine

## 2011-10-29 ENCOUNTER — Ambulatory Visit (INDEPENDENT_AMBULATORY_CARE_PROVIDER_SITE_OTHER): Payer: Medicare Other | Admitting: Internal Medicine

## 2011-10-29 DIAGNOSIS — I10 Essential (primary) hypertension: Secondary | ICD-10-CM

## 2011-10-29 DIAGNOSIS — K219 Gastro-esophageal reflux disease without esophagitis: Secondary | ICD-10-CM

## 2011-10-29 DIAGNOSIS — R6883 Chills (without fever): Secondary | ICD-10-CM

## 2011-10-29 DIAGNOSIS — R05 Cough: Secondary | ICD-10-CM

## 2011-10-29 MED ORDER — LOSARTAN POTASSIUM 100 MG PO TABS: 100.0000 mg | ORAL_TABLET | Freq: Every day | ORAL | Status: DC

## 2011-10-29 MED ORDER — AZITHROMYCIN 250 MG PO TABS: ORAL_TABLET | ORAL | Status: AC

## 2011-10-29 MED ORDER — RANITIDINE HCL 150 MG PO CAPS: 150.0000 mg | ORAL_CAPSULE | Freq: Two times a day (BID) | ORAL | Status: DC

## 2011-10-29 MED ORDER — HYDROCODONE-HOMATROPINE 5-1.5 MG/5ML PO SYRP: 5.0000 mL | ORAL_SOLUTION | Freq: Four times a day (QID) | ORAL | Status: AC | PRN

## 2011-10-29 NOTE — Patient Instructions (Addendum)
The triggers for dyspepsia or "heart burn"  include stress; the "aspirin family" ; alcohol; peppermint; and caffeine (coffee, tea, cola, and chocolate). The aspirin family would include aspirin and the nonsteroidal agents such as ibuprofen &  Naproxen. Tylenol would not cause reflux. If having dyspepsia ; food & drink should be avoided for @ least 2 hours before going to bed. Blood Pressure Goal  Ideally is an AVERAGE < 135/85. This AVERAGE should be calculated from @ least 5-7 BP readings taken @ different times of day on different days of week. You should not respond to isolated BP readings , but rather the AVERAGE for that week .  Start with losartan 100 mg one half daily; increase to whole pill daily after 7-10 days if blood pressure is not at goal.  Return to clinic in 2 weeks please

## 2011-10-29 NOTE — Progress Notes (Signed)
  Subjective:    Patient ID: Mark Copeland, male    DOB: 08-30-1934, 77 y.o.   MRN: [id number]  HPI #1 Hospitalized for Observation for  HYPERTENSION;  Systolic > 200. Presenting symptoms were disorientation; difficulty speaking & lightheadedness & dizziness Since D/C range 140-160/78-88 Chest pain: no,  Palpitations:no  Dyspnea: no  Claudication: no Edema: no Medication compliance: no BP meds Rxed   Preventitive Healthcare:  Exercise: walking slowly 1-2 mpd  Diet Pattern: heart healthy  Salt Restriction: yes  #2 Cough Onset:during IP stay ; onset as ST Extrinsic symptoms:itchy eyes, sneezing:yes  Infectious symptoms :fever, purulent secretions :no but some chills Chest symptoms: sputum production, hemoptysis,dyspnea,wheezing:no GI symptoms: Dyspepsia, reflux:some reflux , no Rx, TUMS Occupational/environmental exposures:no Smoking:quit 1961 ACE inhibitor:no Treatment/efficacy:no Past medical history/family history pulmonary disease: no      Review of Systems     Objective:   Physical Exam General appearance is of good health and nourishment; no acute distress or increased work of breathing is present.  No  lymphadenopathy about the head, neck, or axilla noted.   Eyes: No conjunctival inflammation or lid edema is present. There is no scleral icterus.  Ears:  External ear exam shows no significant lesions or deformities.  Otoscopic examination reveals clear canals, tympanic membranes are intact bilaterally without bulging, retraction, inflammation or discharge.  Nose:  External nasal examination shows no deformity or inflammation. Nasal mucosa are pink and moist without lesions or exudates. No septal dislocation .No obstruction to airflow.   Oral exam: Dental hygiene is good; lips and gums are healthy appearing.There is no oropharyngeal erythema or exudate noted.     Heart:  Normal rate and regular rhythm. S1 and S2 normal without gallop,  click, rub or other extra  sounds.  Grade 1/2 systolic murmur  Lungs:Chest clear to auscultation; no wheezes, rhonchi,rales ,or rubs present.No increased work of breathing. Dry cough   Extremities:  No cyanosis, edema, or clubbing  noted    Skin: Warm & dry          Assessment & Plan:   #1 hypertension; severe hypertension apparently was associated with transitory neurologic deficits as noted above. Treatment for blood pressure control is indicated.  #2 sore throat, nonproductive cough, and chills after inpatient stay for observation.Zpack Rxed  #3 reflux symptoms; quinidine will be initiated.

## 2011-10-31 ENCOUNTER — Ambulatory Visit: Payer: Medicare Other | Admitting: Internal Medicine

## 2011-11-13 ENCOUNTER — Encounter: Payer: Self-pay | Admitting: Internal Medicine

## 2011-11-13 ENCOUNTER — Ambulatory Visit (INDEPENDENT_AMBULATORY_CARE_PROVIDER_SITE_OTHER): Payer: Medicare Other | Admitting: Internal Medicine

## 2011-11-13 DIAGNOSIS — R918 Other nonspecific abnormal finding of lung field: Secondary | ICD-10-CM

## 2011-11-13 DIAGNOSIS — R9389 Abnormal findings on diagnostic imaging of other specified body structures: Secondary | ICD-10-CM | POA: Insufficient documentation

## 2011-11-13 DIAGNOSIS — K219 Gastro-esophageal reflux disease without esophagitis: Secondary | ICD-10-CM

## 2011-11-13 DIAGNOSIS — I10 Essential (primary) hypertension: Secondary | ICD-10-CM

## 2011-11-13 MED ORDER — OMEPRAZOLE 20 MG PO CPDR: 20.0000 mg | DELAYED_RELEASE_CAPSULE | Freq: Two times a day (BID) | ORAL | Status: DC

## 2011-11-13 MED ORDER — HYDROCHLOROTHIAZIDE 12.5 MG PO CAPS: 12.5000 mg | ORAL_CAPSULE | Freq: Every day | ORAL | Status: DC

## 2011-11-13 NOTE — Patient Instructions (Addendum)
The triggers for dyspepsia or "heart burn"  include stress; the "aspirin family" ; alcohol; peppermint; and caffeine (coffee, tea, cola, and chocolate). The aspirin family would include aspirin and the nonsteroidal agents such as ibuprofen &  Naproxen. Tylenol would not cause reflux. If having dyspepsia ; food & drink should be avoided for @ least 2 hours before going to bed.  Blood Pressure Goal  Ideally is an AVERAGE < 135/85. This AVERAGE should be calculated from @ least 5-7 BP readings taken @ different times of day on different days of week. You should not respond to isolated BP readings , but rather the AVERAGE for that week . Add the HCTZ 12.5 mg daily if blood pressure is not at goal.

## 2011-11-13 NOTE — Progress Notes (Signed)
  Subjective:    Patient ID: Mark Copeland, male    DOB: [birth year], 78 y.o.   MRN: [id number]  HPI  HYPERTENSION: Disease Monitoring  Blood pressure range: 124/70-160/84  Chest pain: no   Dyspnea: no   Claudication: no   Medication compliance: yes, Losartan titrated to 100 mg   Medication Side Effects  Lightheadedness: yes, minor in am   Urinary frequency: no   Edema: no    Preventitive Healthcare:  Exercise: yes, walking 1-2 mpd   Diet Pattern: heart healthy  Salt Restriction: yes  Cough: Onset:2 weeks ago Character: am congestion w/o sputum Course: improving Extrinsic symptoms: no itchy eyes; some sneezing  Infectious symptoms:no fever, purulent secretions Chest symptoms:no sputum production, hemoptysis,dyspnea,wheezing GI symptoms: some  Dyspepsia & "gas". Gasex helps; on ranitidine twice a day Occupational/environmental exposures:no Smoking:quit 1961 ACE inhibitor:no Treatment/efficacy:antibiotic & cough syrup Past medical history/family history pulmonary disease: no      Review of Systems     Objective:   Physical Exam General appearance thin, :good health ;well nourished; no acute distress or increased work of breathing is present.  No  lymphadenopathy about the head, neck, or axilla noted.   Eyes: No conjunctival inflammation or lid edema is present. There is no scleral icterus.  Ears:  External ear exam shows no significant lesions or deformities.  Otoscopic examination reveals clear canals, tympanic membranes are intact bilaterally without bulging, retraction, inflammation or discharge.  Nose:  External nasal examination shows no deformity or inflammation. Nasal mucosa are pink and moist without lesions or exudates. No septal dislocation or deviation.No obstruction to airflow.   Oral exam: Dental hygiene is good; lips and gums are healthy appearing.There is no oropharyngeal erythema or exudate noted. Asymmetry of posterior pharynx ; L lower than  R    Heart:  Normal rate and regular rhythm. S1 and S2 normal without gallop, murmur, click, rub or other extra sounds.   Lungs:Chest clear to auscultation; no wheezes, rhonchi,rales ,or rubs present.No increased work of breathing.    Extremities:  No cyanosis, edema, or clubbing  noted   All pulses intact without  bruits .No ischemic skin changes.     Skin: Warm & dry w/o jaundice           Assessment & Plan:  #1 cough, worse in the morning, nonproductive. This is most likely related to reflux. Chest x-ray 10/23/11 revealed no active process.

## 2011-11-13 NOTE — Assessment & Plan Note (Signed)
He describes increased gas despite taking ranitidine twice a day. His cough is worse in the morning; this suggests it may be related to his reflux. Ranitidine will be changed to omeprazole twice a day before meals.

## 2011-12-03 ENCOUNTER — Encounter: Payer: Self-pay | Admitting: Internal Medicine

## 2011-12-03 ENCOUNTER — Ambulatory Visit (INDEPENDENT_AMBULATORY_CARE_PROVIDER_SITE_OTHER): Payer: Medicare Other | Admitting: Internal Medicine

## 2011-12-03 VITALS — BP 172/90 | HR 73 | Temp 97.8°F | Resp 16 | Wt 174.4 lb

## 2011-12-03 DIAGNOSIS — I10 Essential (primary) hypertension: Secondary | ICD-10-CM

## 2011-12-03 MED ORDER — AMLODIPINE BESYLATE 5 MG PO TABS: 5.0000 mg | ORAL_TABLET | Freq: Every day | ORAL | Status: DC

## 2011-12-03 MED ORDER — CARVEDILOL 12.5 MG PO TABS: 12.5000 mg | ORAL_TABLET | Freq: Two times a day (BID) | ORAL | Status: DC

## 2011-12-03 NOTE — Progress Notes (Signed)
  Subjective:    Patient ID: Mark Copeland, male    DOB: [birth year], 78 y.o.   MRN: [id number]  HPI Acute visit, complaining of cough and high blood pressure.   the patient was admitted 10/23/2011 with elevated blood pressure and mental status changes, he was started on losartan 50 mg, came in to see his PCP 10/29/2011, he complained with cough, was treated with a Z-Pak and Prilosec for possible GERD. His losartan was increased to 100 mg. Since that visit, BP has been well-controlled with a systolic BP of ~ 409 except for today. Today he felt lightheaded and flushed, his wife who is a nurse check his BP and it was 180. He came here this afternoon, he is now feeling great, his blood pressure at the office is 172/90.  As far as the cough, he is coughing  since he went to the hospital. These coincide with the initiation of losartan. He had a normal chest x-ray last month, status post a zpack  Past Medical History  Diagnosis Date  . Interstitial cystitis     presentation in 1997 w/ abdominal pain/distention, Dr. Eudelia Bunch, [facility]  . Colonic polyp     Dr.John hayes  . BPH (benign prostatic hypertrophy)   . Skin cancer     basal cell, Dr.Dan Yetta Barre  . Hyperlipidemia   . Fasting hyperglycemia   . Nephrolithiasis     x 2      Review of Systems No fever or chills Denies any sinus congestion or sinus pain. No GERD symptoms. Denies any headaches today, no chest pain or shortness of breath. Since he left the hospital, and no further episodes of confusion. Denies taking NSAIDs recently. No increase in salt intake.     Objective:   Physical Exam  Constitutional: He is oriented to person, place, and time. He appears well-developed and well-nourished.  Cardiovascular: Normal rate, regular rhythm and normal heart sounds.   No murmur heard. Pulmonary/Chest: Effort normal and breath sounds normal. No respiratory distress. He has no wheezes. He has no rales.  Musculoskeletal: He exhibits no  edema.  Neurological: He is alert and oriented to person, place, and time.       Speech gait and motor are intact. Face is symmetric  Psychiatric: He has a normal mood and affect. His behavior is normal. Judgment and thought content normal.       Assessment & Plan:  Today I review the admission records from last month and Dr. Alwyn Ren notes . Today , I spent more than 25  min with the patient, >50% of the time counseling, and /or reviewing the chart and labs ordered by other providers

## 2011-12-03 NOTE — Patient Instructions (Signed)
Take meds as prescribed Call if BP > 145/85 or if side effects See Dr Alwyn Ren in 2 weeks

## 2011-12-03 NOTE — Assessment & Plan Note (Addendum)
Admitted last month with hypertensive urgency, started on losartan. BP has been well-controlled up until this morning when his BP was elevated. he was prescribed diuretics but has not started them yet. This afternoon, the blood pressure is still elevated but he is asymptomatic. Also has developed a persisting/severe cough, patient is aggravated by it. It is possible that the cough is related to losartan, we agreed to change to amlodipine and coreg. See  instructions.

## 2012-01-27 ENCOUNTER — Other Ambulatory Visit: Payer: Self-pay | Admitting: Internal Medicine

## 2012-01-27 NOTE — Telephone Encounter (Signed)
Refill done.  

## 2012-02-20 ENCOUNTER — Other Ambulatory Visit: Payer: Self-pay | Admitting: Dermatology

## 2012-03-03 ENCOUNTER — Ambulatory Visit (INDEPENDENT_AMBULATORY_CARE_PROVIDER_SITE_OTHER): Payer: Medicare Other | Admitting: Internal Medicine

## 2012-03-03 VITALS — BP 150/78 | HR 54 | Temp 98.0°F | Wt 173.0 lb

## 2012-03-03 DIAGNOSIS — F419 Anxiety disorder, unspecified: Secondary | ICD-10-CM | POA: Insufficient documentation

## 2012-03-03 DIAGNOSIS — F411 Generalized anxiety disorder: Secondary | ICD-10-CM

## 2012-03-03 DIAGNOSIS — I10 Essential (primary) hypertension: Secondary | ICD-10-CM

## 2012-03-03 MED ORDER — CITALOPRAM HYDROBROMIDE 20 MG PO TABS: 20.0000 mg | ORAL_TABLET | Freq: Every day | ORAL | Status: DC

## 2012-03-03 MED ORDER — AMLODIPINE BESYLATE 5 MG PO TABS: 10.0000 mg | ORAL_TABLET | Freq: Every day | ORAL | Status: DC

## 2012-03-03 NOTE — Progress Notes (Signed)
  Subjective:    Patient ID: Mark Copeland, male    DOB: 22-Feb-1934, 78 y.o.   MRN: [id number]  HPI Acute visit, here with his wife The patient reports that he was feeling okay but yesterday he "didn't feel right", he was lightheaded, flushed, he had some ringing in his ears. His wife who is a nurse check his blood pressure , it was in the range from 140/80-180/90. In previous weeks when he was feeling well, his blood pressure was essentially within normal. On further questioning, he admits to some stress. There is a number of family issues going on for at least a year, he reports feeling tired, irritable, "short". Wife confirms this symptoms. She reports that the way he copes with stress is by doing things around the house almost compulsively. He stopped doing things he enjoys like going fishing.  Past Medical History  Diagnosis Date  . Interstitial cystitis     presentation in 1997 w/ abdominal pain/distention, Dr. Eudelia Bunch, [facility]  . Colonic polyp     Dr.John hayes  . BPH (benign prostatic hypertrophy)   . Skin cancer     basal cell, Dr.Dan Yetta Barre  . Hyperlipidemia   . Fasting hyperglycemia   . Nephrolithiasis     x 2      Review of Systems Reports good medication compliance with amlodipine and carvedilol The patient's concern about a stroke but denies any focal deficit, slurred speech, mental status changes. No chest pain, headache, shortness of breath or lower stomach edema. No nose bleeds. Denies suicidal thoughts. Denies taking any over-the-counter Motrin or Motrin like meds . He does follow a low-salt diet.    Objective:   Physical Exam General -- alert, well-developed, and well-nourished.   Neck --no thyromegaly , normal carotid pulse. Lungs -- normal respiratory effort, no intercostal retractions, no accessory muscle use, and normal breath sounds.   Heart-- normal rate, regular rhythm, no murmur, and no gallop.   Extremities-- no pretibial edema  bilaterally Neurologic-- alert & oriented X3 and strength normal in all extremities. Speech, gait and motor are intact. EOMI, pupils equal and reactive. Psych-- Cognition and judgment appear intact. Alert and cooperative with normal attention span and concentration.  Mildly  anxious appearing and not depressed appearing.      Assessment & Plan:  Today , I spent more than35 min with the patient, >50% of the time counseling

## 2012-03-03 NOTE — Assessment & Plan Note (Addendum)
Patient presents here for the treatment of hypertension however on further questioning, I realize he is very anxious. See history of present illness. He reports almost a compulsive behavior as a mechanism to cope with anxiety. He also reports that in the past he "got hooked" to Valium. Different treatment modalities were discussed, we will avoid benzodiazepines due to his history. We discussed SSRIs, I think that is a good choice for him. Plan: Citalopram which hopefully we'll sedate him and help him sleep.

## 2012-03-03 NOTE — Assessment & Plan Note (Addendum)
BP well controlled until recently, the patient started to feel lightheaded, BP checked  yesterday was as high as 180/90. He has a history of cough induced by losartan. Anxiety is very likely playing a role on his BP Plan: BMP, CBC, TSH. Increase amlodipine to 10 mg a day rx  anxiety

## 2012-03-03 NOTE — Patient Instructions (Signed)
Please come back tomorrow for blood work:BMP, CBC, TSH ---- dx hypertension Increase amlodipine to 2 tablets daily and continue with Carvedilol as is  Start citalopram half tablet daily for one week, then take a whole tablet. Take it at night. Continue checking her BP once a day, call if he is more than 165/95. Call anytime if he has headache, chest pain, feels worse  See your primary doctor in 3 weeks.

## 2012-03-04 ENCOUNTER — Telehealth: Payer: Self-pay | Admitting: Internal Medicine

## 2012-03-04 ENCOUNTER — Encounter: Payer: Self-pay | Admitting: Internal Medicine

## 2012-03-04 ENCOUNTER — Other Ambulatory Visit (INDEPENDENT_AMBULATORY_CARE_PROVIDER_SITE_OTHER): Payer: Medicare Other

## 2012-03-04 DIAGNOSIS — I10 Essential (primary) hypertension: Secondary | ICD-10-CM

## 2012-03-04 LAB — BASIC METABOLIC PANEL
BUN: 21 mg/dL (ref 6–23)
CO2: 28 mEq/L (ref 19–32)
Chloride: 104 mEq/L (ref 96–112)
Glucose, Bld: 83 mg/dL (ref 70–99)
Potassium: 4.6 mEq/L (ref 3.5–5.1)

## 2012-03-04 LAB — CBC
MCV: 91.2 fl (ref 78.0–100.0)
RBC: 4.99 Mil/uL (ref 4.22–5.81)

## 2012-03-04 NOTE — Telephone Encounter (Signed)
Done

## 2012-03-04 NOTE — Progress Notes (Signed)
LABS ONLY  

## 2012-03-04 NOTE — Telephone Encounter (Signed)
Dr.Paz spoke to patient last yesterday & per his notes the labs below need to be done. Patient is coming in at 8:45am today here can you put in orders please  BMP, CBC, TSH  Thanks

## 2012-03-24 ENCOUNTER — Ambulatory Visit: Payer: Medicare Other | Admitting: Internal Medicine

## 2012-03-30 ENCOUNTER — Other Ambulatory Visit: Payer: Self-pay | Admitting: Internal Medicine

## 2012-03-30 NOTE — Telephone Encounter (Signed)
Refill Carvedilol 12.5mg  tablet  Requesting 90-day supply  Last written 03.18.13 qty 60  TAKE 1 TABLET (12.5 MG TOTAL) BY MOUTH 2 (TWO) TIMES DAILY WITH A MEAL.   Last ov 4.23.13  & Amlodipine Besylate 5mg  tab, also requesting 90-day supply Last written 4.23.13, qty 60 Take 2 tablets (10 mg total) by mouth daily

## 2012-03-31 MED ORDER — AMLODIPINE BESYLATE 5 MG PO TABS: 10.0000 mg | ORAL_TABLET | Freq: Every day | ORAL | Status: DC

## 2012-03-31 MED ORDER — CARVEDILOL 12.5 MG PO TABS: ORAL_TABLET | ORAL | Status: DC

## 2012-03-31 NOTE — Telephone Encounter (Signed)
RXs sent.

## 2012-04-07 ENCOUNTER — Encounter: Payer: Self-pay | Admitting: Internal Medicine

## 2012-04-07 ENCOUNTER — Ambulatory Visit (INDEPENDENT_AMBULATORY_CARE_PROVIDER_SITE_OTHER): Payer: Medicare Other | Admitting: Internal Medicine

## 2012-04-07 VITALS — BP 134/82 | HR 59 | Temp 98.6°F | Wt 173.0 lb

## 2012-04-07 DIAGNOSIS — R6889 Other general symptoms and signs: Secondary | ICD-10-CM

## 2012-04-07 DIAGNOSIS — R197 Diarrhea, unspecified: Secondary | ICD-10-CM

## 2012-04-07 DIAGNOSIS — R143 Flatulence: Secondary | ICD-10-CM

## 2012-04-07 DIAGNOSIS — R946 Abnormal results of thyroid function studies: Secondary | ICD-10-CM

## 2012-04-07 NOTE — Progress Notes (Signed)
  Subjective:    Patient ID: Mark Copeland, male    DOB: 21-Dec-1933, 78 y.o.   MRN: [id number]  HPI He has had "gas" for 12-18 months;it  is essentially stable. There are no specific  food or other triggers for the symptoms. Belching and have a bowel movement can help.  There is no associated abdominal pain, dysphagia, unexplained weight loss, melena, or rectal bleeding.  Dyspepsia is well controlled with ranitidine 150 mg twice a day. The simethicone  product has not been of benefit .  Upper endoscopy last year revealed a hiatal hernia. He had a cholecystectomy laparoscopically in September 2012; this has had no impact on his present symptoms.  There is no family history of ulcers, colitis, colon polyps or colon cancer.    Review of Systems Nausea/Vomiting: no  Diarrhea: watery BM X 2 every am X 2 weeks  Constipation: no Hematemesis:no  Anorexia: no  Fever/Chills:no but some cold intolerance Dysuria/ hematuria/pyuria: no EtOH use:no NSAIDs/ASA: 81 mg ASA only TSH was 0.5-1 month ago. Serially it had been decreasing He states his wife feels that the citalopram has been of benefit; "I'm less edgy"       Objective:   Physical Exam General appearance :thin but  good health and nourishment w/o distress.  Eyes: No conjunctival inflammation or scleral icterus is present.EOMI w/o lid lag or proptosis  Neck: No masses; physiologic asymmetry of the thyroid without nodularity  Oral exam: Dental hygiene is good; lips and gums are healthy appearing.There is no oropharyngeal erythema or exudate noted.   Heart:  Slow rate and regular rhythm. S1 and S2 normal without gallop, murmur, click, rub or other extra sounds     Lungs:Chest clear to auscultation; no wheezes, rhonchi,rales ,or rubs present.No increased work of breathing. Increased AP diameter  Abdomen: bowel sounds normal, soft and non-tender without masses, organomegaly or hernias noted.  No guarding or rebound   Skin:Warm &  dry.  Intact without suspicious lesions or rashes ; no jaundice or tenting  Lymphatic: No lymphadenopathy is noted about the head, neck, axilla areas.   Neuro: Deep tendon reflexes normal; no tremor present.  Extremities: No cyanosis, clubbing, or edema. Minimal osteo arthritic hand changes             Assessment & Plan:  #1 chronic, stable bowel dysfunction manifested by increased gas. This is in the context of known hiatal hernia.  #2 self-limited diarrhea x2 weeks  #3 low normal TSH  Plan: See orders and recommendations. A trial of pancreatic enzymes with meals would be appropriate; by history an anti-spasmodic( Levsin SL)  has not been of benefit

## 2012-04-07 NOTE — Patient Instructions (Signed)
Please try to go on My Chart within the next 24 hours to allow me to release the results directly to you.    Please keep a food diary of possible food triggers. If you have frank diarrhea (frankly watery stool) look @  the previous one to 2 meals. The most common  food triggers for bowel dysfunction include lactose (milk sugar) or wheat / gluten which can cause Sprue.  Please take the enzyme with each meal as a trial to eliminate the gas symptoms.

## 2012-05-06 DIAGNOSIS — N320 Bladder-neck obstruction: Secondary | ICD-10-CM | POA: Insufficient documentation

## 2012-05-06 DIAGNOSIS — N529 Male erectile dysfunction, unspecified: Secondary | ICD-10-CM | POA: Insufficient documentation

## 2012-07-14 ENCOUNTER — Other Ambulatory Visit: Payer: Self-pay | Admitting: Internal Medicine

## 2012-07-14 NOTE — Telephone Encounter (Signed)
rx sent to pharmacy by e-script  

## 2012-07-14 NOTE — Telephone Encounter (Signed)
Last OV 04-07-12 last refill 03-03-12 #30 with 1 refill

## 2012-07-14 NOTE — Telephone Encounter (Signed)
#   30, R X 2 

## 2012-09-23 ENCOUNTER — Telehealth: Payer: Self-pay | Admitting: Internal Medicine

## 2012-09-23 MED ORDER — CARVEDILOL 12.5 MG PO TABS: ORAL_TABLET | ORAL | Status: DC

## 2012-09-23 MED ORDER — AMLODIPINE BESYLATE 5 MG PO TABS: 10.0000 mg | ORAL_TABLET | Freq: Every day | ORAL | Status: DC

## 2012-09-23 NOTE — Telephone Encounter (Signed)
Refill: Carvedilol 12.5 mg tab. Take 1 tablet by mouth 2 times daily with a meal. Qty 60. Last fill 03-25-12. *Request 90 day supply*  Amlodipine besylate 5 mg tab. Take 1 tablet by mouth every day. Qty 30. Last fill 03-25-12.  *Request 90 day supply*  Doxycycline hyclate 100 mg tab. Take 1 tablet twice a day for 5 days. Qty 10. Last fill 03-18-12

## 2012-09-23 NOTE — Telephone Encounter (Signed)
RX sent for amlodipine and carvedilol. I called patient to discuss request for ABX for it would require an appointment. Patient stated that was requested in error, patient only needs B/P medication

## 2012-10-21 ENCOUNTER — Other Ambulatory Visit: Payer: Self-pay | Admitting: Internal Medicine

## 2012-10-22 ENCOUNTER — Other Ambulatory Visit: Payer: Self-pay | Admitting: Internal Medicine

## 2012-12-26 ENCOUNTER — Other Ambulatory Visit: Payer: Self-pay

## 2013-01-16 ENCOUNTER — Other Ambulatory Visit: Payer: Self-pay | Admitting: Internal Medicine

## 2013-03-31 ENCOUNTER — Other Ambulatory Visit: Payer: Self-pay | Admitting: Dermatology

## 2013-04-15 ENCOUNTER — Ambulatory Visit (INDEPENDENT_AMBULATORY_CARE_PROVIDER_SITE_OTHER): Payer: Medicare Other | Admitting: Internal Medicine

## 2013-04-15 ENCOUNTER — Encounter: Payer: Self-pay | Admitting: Internal Medicine

## 2013-04-15 VITALS — BP 126/84 | HR 61 | Temp 98.1°F | Resp 12 | Ht 68.5 in | Wt 171.0 lb

## 2013-04-15 DIAGNOSIS — Z0000 Encounter for general adult medical examination without abnormal findings: Secondary | ICD-10-CM

## 2013-04-15 DIAGNOSIS — I10 Essential (primary) hypertension: Secondary | ICD-10-CM

## 2013-04-15 DIAGNOSIS — Z86010 Personal history of colonic polyps: Secondary | ICD-10-CM

## 2013-04-15 DIAGNOSIS — E785 Hyperlipidemia, unspecified: Secondary | ICD-10-CM

## 2013-04-15 LAB — CBC WITH DIFFERENTIAL/PLATELET
Basophils Absolute: 0.1 10*3/uL (ref 0.0–0.1)
Eosinophils Relative: 5.9 % — ABNORMAL HIGH (ref 0.0–5.0)
Lymphs Abs: 3.1 10*3/uL (ref 0.7–4.0)
MCV: 90.8 fl (ref 78.0–100.0)
Monocytes Absolute: 0.6 10*3/uL (ref 0.1–1.0)
Neutrophils Relative %: 50.1 % (ref 43.0–77.0)
Platelets: 215 10*3/uL (ref 150.0–400.0)
RDW: 13.3 % (ref 11.5–14.6)
WBC: 8.6 10*3/uL (ref 4.5–10.5)

## 2013-04-15 LAB — LIPID PANEL
Cholesterol: 169 mg/dL (ref 0–200)
LDL Cholesterol: 109 mg/dL — ABNORMAL HIGH (ref 0–99)
Total CHOL/HDL Ratio: 4

## 2013-04-15 LAB — BASIC METABOLIC PANEL
BUN: 16 mg/dL (ref 6–23)
Chloride: 104 mEq/L (ref 96–112)
Creatinine, Ser: 1 mg/dL (ref 0.4–1.5)
GFR: 79.78 mL/min (ref >60.00)
Glucose, Bld: 96 mg/dL (ref 70–99)

## 2013-04-15 LAB — HEPATIC FUNCTION PANEL
ALT: 18 U/L (ref 0–53)
Bilirubin, Direct: 0.1 mg/dL (ref 0.0–0.3)
Total Bilirubin: 0.9 mg/dL (ref 0.3–1.2)

## 2013-04-15 MED ORDER — AMLODIPINE BESYLATE 5 MG PO TABS: 5.0000 mg | ORAL_TABLET | Freq: Every day | ORAL | Status: DC

## 2013-04-15 MED ORDER — CARVEDILOL 6.25 MG PO TABS: 6.2500 mg | ORAL_TABLET | Freq: Two times a day (BID) | ORAL | Status: DC

## 2013-04-15 NOTE — Progress Notes (Signed)
Subjective:    Patient ID: Mark Copeland, male    DOB: [birth year], 79 y.o.   MRN: [id number]  HPI Medicare Wellness Visit:  Psychosocial & medical history were reviewed as required by Medicare (abuse,antisocial behavioral risks,firearm risk).  Social history: caffeine:none  , alcohol: occasional  ,  tobacco use: quit 1961   Exercise :  See below Home & personal  safety / fall risk:no Limitations of activities of daily living:no Seatbelt  and smoke alarm use:yes Power of Attorney/Living Will status : in place Ophthalmology exam status :pending Hearing evaluation status:no Orientation :oriented X 3 Memory & recall :good Spelling  testing:good Mood & affect :normal  . Active depression / anxiety:denied Foreign travel history : [place] 2013 Immunization status for Shingles /Flu/ PNA/ tetanus :current Transfusion history: no  Preventive health surveillance status of colonoscopy as per protocol/ WUJ:[id number] Dental care:  Every 6 mos Chart reviewed &  Updated. Active issues reviewed & addressed.      Review of Systems  He is on a heart healthy diet; he also restricts salt.He  exercises 60 minutes as walking 7 times per week without symptoms. Specifically he denies chest pain, palpitations, dyspnea, or claudication. Family history is negative for premature coronary disease.  BP averages 120-22/80.    Objective:   Physical Exam Gen.: Healthy and well-nourished in appearance. Alert, appropriate and cooperative throughout exam. Appears younger than stated age  Head: Normocephalic without obvious abnormalities;  pattern alopecia  Eyes: No corneal or conjunctival inflammation noted.  Extraocular motion intact. Vision grossly normal with lenses Ears: External  ear exam reveals no significant lesions or deformities. Canals clear .TMs normal. Hearing is grossly normal bilaterally. Nose: External nasal exam reveals no deformity or inflammation. Nasal mucosa are pink and moist. No  lesions or exudates noted. Septum to L Mouth: Oral mucosa and oropharynx reveal no lesions or exudates. Teeth in good repair. Neck: No deformities, masses, or tenderness noted. Range of motion & Thyroid normal. Lungs: Normal respiratory effort; chest expands symmetrically. Lungs are clear to auscultation without rales, wheezes, or increased work of breathing.Barrel chested Heart: Slow rate and regular rhythm. Normal S1 and S2. No gallop, click, or rub. No murmur. Abdomen: Bowel sounds normal; abdomen soft and nontender. No masses, organomegaly or hernias noted. Genitalia: As per Dr Logan Bores                             Musculoskeletal/extremities:   Accentuated curvature of  thoracic  Spine. No clubbing, cyanosis, edema, or significant extremity  deformity noted. Range of motion normal .Tone & strength  Normal. Joints normal . Nail health good. Able to lie down & sit up w/o help. Negative SLR bilaterally Vascular: Carotid, radial artery, dorsalis pedis and  posterior tibial pulses are full and equal. No bruits present. Neurologic: Alert and oriented x3. Deep tendon reflexes symmetrical and normal.  Slight tremor RUE .        Skin: Intact without suspicious lesions or rashes. Lymph: No cervical, axillary lymphadenopathy present. Psych: Mood and affect are normal. Normally interactive  Assessment & Plan:  #1 Medicare Wellness Exam; criteria met ; data entered #2 Problem List/Diagnoses reviewed Plan:  Assessments made/ Orders entered  

## 2013-04-15 NOTE — Patient Instructions (Addendum)
Benign intention tremors are typically genetic. It is a tremor typically aggravated by purposeful activity. Additionally stimulants such as decongestants, diet pills, nicotine, or caffeine can aggravate it. If it is problematic the nonselective beta blocker propranolol could be prescribed. Minimal Blood Pressure Goal= AVERAGE < 140/90;  Ideal is an AVERAGE < 135/85. This AVERAGE should be calculated from @ least 5-7 BP readings taken @ different times of day on different days of week. You should not respond to isolated BP readings , but rather the AVERAGE for that week .Please bring your  blood pressure cuff to office visits to verify that it is reliable.It  can also be checked against the blood pressure device at the pharmacy. Finger or wrist cuffs are not dependable; an arm cuff is.

## 2013-04-16 LAB — POCT URINALYSIS DIPSTICK
Leukocytes, UA: NEGATIVE
Protein, UA: NEGATIVE
Urobilinogen, UA: 0.2

## 2013-04-16 NOTE — Addendum Note (Signed)
Addended by: Silvio Pate D on: 04/16/2013 03:24 PM   Modules accepted: Orders

## 2013-06-17 ENCOUNTER — Emergency Department (HOSPITAL_COMMUNITY)
Admission: EM | Admit: 2013-06-17 | Discharge: 2013-06-17 | Disposition: A | Discharge status: Home / Self Care | Payer: Medicare Other | Attending: Emergency Medicine | Admitting: Emergency Medicine

## 2013-06-17 ENCOUNTER — Emergency Department (HOSPITAL_COMMUNITY): Payer: Medicare Other

## 2013-06-17 ENCOUNTER — Encounter (HOSPITAL_COMMUNITY): Payer: Self-pay | Admitting: Emergency Medicine

## 2013-06-17 DIAGNOSIS — Z862 Personal history of diseases of the blood and blood-forming organs and certain disorders involving the immune mechanism: Secondary | ICD-10-CM | POA: Insufficient documentation

## 2013-06-17 DIAGNOSIS — N201 Calculus of ureter: Secondary | ICD-10-CM | POA: Insufficient documentation

## 2013-06-17 DIAGNOSIS — Z8719 Personal history of other diseases of the digestive system: Secondary | ICD-10-CM | POA: Insufficient documentation

## 2013-06-17 DIAGNOSIS — Z87442 Personal history of urinary calculi: Secondary | ICD-10-CM | POA: Insufficient documentation

## 2013-06-17 DIAGNOSIS — R300 Dysuria: Secondary | ICD-10-CM | POA: Insufficient documentation

## 2013-06-17 DIAGNOSIS — Z860100 Personal history of colon polyps, unspecified: Secondary | ICD-10-CM | POA: Insufficient documentation

## 2013-06-17 DIAGNOSIS — Z880 Allergy status to penicillin: Secondary | ICD-10-CM | POA: Insufficient documentation

## 2013-06-17 DIAGNOSIS — Z8639 Personal history of other endocrine, nutritional and metabolic disease: Secondary | ICD-10-CM | POA: Insufficient documentation

## 2013-06-17 DIAGNOSIS — Z87891 Personal history of nicotine dependence: Secondary | ICD-10-CM | POA: Insufficient documentation

## 2013-06-17 DIAGNOSIS — Z85828 Personal history of other malignant neoplasm of skin: Secondary | ICD-10-CM | POA: Insufficient documentation

## 2013-06-17 DIAGNOSIS — Z87448 Personal history of other diseases of urinary system: Secondary | ICD-10-CM | POA: Insufficient documentation

## 2013-06-17 DIAGNOSIS — Z9889 Other specified postprocedural states: Secondary | ICD-10-CM | POA: Insufficient documentation

## 2013-06-17 LAB — CBC WITH DIFFERENTIAL/PLATELET
Eosinophils Absolute: 0.6 10*3/uL (ref 0.0–0.7)
Hemoglobin: 15.5 g/dL (ref 13.0–17.0)
Lymphocytes Relative: 31 % (ref 12–46)
Lymphs Abs: 3 10*3/uL (ref 0.7–4.0)
MCH: 30.5 pg (ref 26.0–34.0)
MCV: 87.2 fL (ref 78.0–100.0)
Monocytes Relative: 8 % (ref 3–12)
Neutrophils Relative %: 55 % (ref 43–77)
RBC: 5.09 MIL/uL (ref 4.22–5.81)
WBC: 9.9 10*3/uL (ref 4.0–10.5)

## 2013-06-17 LAB — BASIC METABOLIC PANEL
Calcium: 10.1 mg/dL (ref 8.4–10.5)
GFR calc non Af Amer: 58 mL/min — ABNORMAL LOW (ref >90)
Sodium: 136 mEq/L (ref 135–145)

## 2013-06-17 LAB — URINE MICROSCOPIC-ADD ON

## 2013-06-17 LAB — URINALYSIS, ROUTINE W REFLEX MICROSCOPIC
Glucose, UA: NEGATIVE mg/dL
Leukocytes, UA: NEGATIVE
Protein, ur: NEGATIVE mg/dL
pH: 5 (ref 5.0–8.0)

## 2013-06-17 MED ORDER — HYDROCODONE-ACETAMINOPHEN 5-325 MG PO TABS: 2.0000 | ORAL_TABLET | ORAL | Status: DC | PRN

## 2013-06-17 NOTE — ED Notes (Signed)
Patient given urinal and requested urine sample.

## 2013-06-17 NOTE — ED Provider Notes (Signed)
CSN: [id number]     Arrival date & time 06/17/13  1827 History     First MD Initiated Contact with Patient 06/17/13 1857     Chief Complaint  Patient presents with  . left flank pain    (Consider location/radiation/quality/duration/timing/severity/associated sxs/prior Treatment) HPI Comments: Pt has several episodes of sudden onset sharp, stabbing  L flank pain with LLQ radiation, and dysuria today.  Episodes lasting about 5 mins. No hematuria.  Patient is a 79 y.o. male presenting with flank pain. The history is provided by the patient. No language interpreter was used.  Flank Pain This is a new problem. The current episode started 6 to 12 hours ago. The problem occurs constantly. Progression since onset: waxing, waning. Associated symptoms include abdominal pain. Pertinent negatives include no chest pain, no headaches and no shortness of breath. Nothing aggravates the symptoms. Nothing relieves the symptoms. He has tried nothing for the symptoms. The treatment provided no relief.    Past Medical History  Diagnosis Date  . Interstitial cystitis     presentation in 1997 w/ abdominal pain/distention, Dr. Eudelia Bunch, [facility]  . Colonic polyp 2011    Dr.John Madilyn Fireman  . BPH (benign prostatic hypertrophy)   . Skin cancer     basal cell, Dr.Dan Yetta Barre  . Hyperlipidemia   . Fasting hyperglycemia   . Nephrolithiasis     x 2  . Hiatal hernia 2012   Past Surgical History  Procedure Laterality Date  . Cystoscopy      2004/2011; Hospitalized 1997 w/ I.C.  . Colonoscopy w/ polypectomy      and esophageal dilation 01/2008 by Dr.Hayes; bladder stones s/p urethral dilation 10/2007 by Dr. Logan Bores  . Tonsillectomy    . Septoplasty    . Laparoscopic cholecystectomy  07/14/11  . Upper gastrointestinal endoscopy  2012    Dr Dorena Cookey; hiatal hernia   Family History  Problem Relation Age of Onset  . Stroke Father 76  . Hypertension Father   . Coronary artery disease Father   . Stroke Mother 31  .  Hypertension Brother   . Diabetes Neg Hx   . Cancer Neg Hx    History  Substance Use Topics  . Smoking status: Former Smoker    Quit date: 08/04/1960  . Smokeless tobacco: Never Used     Comment: smoked 1955-1961, up to 2 cigars / day  . Alcohol Use: Yes     Comment:  occasional wine    Review of Systems  Constitutional: Negative for fever, activity change, appetite change and fatigue.  HENT: Negative for congestion, facial swelling, rhinorrhea and trouble swallowing.   Eyes: Negative for photophobia and pain.  Respiratory: Negative for cough, chest tightness and shortness of breath.   Cardiovascular: Negative for chest pain and leg swelling.  Gastrointestinal: Positive for abdominal pain. Negative for nausea, vomiting, diarrhea and constipation.  Endocrine: Negative for polydipsia and polyuria.  Genitourinary: Positive for dysuria and flank pain. Negative for urgency, decreased urine volume and difficulty urinating.  Musculoskeletal: Negative for back pain and gait problem.  Skin: Negative for color change, rash and wound.  Allergic/Immunologic: Negative for immunocompromised state.  Neurological: Negative for dizziness, facial asymmetry, speech difficulty, weakness, numbness and headaches.  Psychiatric/Behavioral: Negative for confusion, decreased concentration and agitation.    Allergies  Penicillins; Sulfonamide derivatives; Shellfish allergy; and Tamsulosin  Home Medications   Current Outpatient Rx  Name  Route  Sig  Dispense  Refill  . amLODipine (NORVASC) 5 MG tablet  Oral   Take 1 tablet (5 mg total) by mouth daily.   90 tablet   3   . fexofenadine (ALLEGRA) 180 MG tablet   Oral   Take 180 mg by mouth daily as needed (allergies).         . fluticasone (FLONASE) 50 MCG/ACT nasal spray   Nasal   Place 2 sprays into the nose 2 (two) times daily as needed for rhinitis or allergies.         Marland Kitchen HYDROcodone-acetaminophen (NORCO) 5-325 MG per tablet   Oral    Take 2 tablets by mouth every 4 (four) hours as needed for pain.   10 tablet   0    BP 154/76  Pulse 68  Temp(Src) 98.1 F (36.7 C) (Oral)  Resp 21  SpO2 98% Physical Exam  Constitutional: He is oriented to person, place, and time. He appears well-developed and well-nourished. No distress.  HENT:  Head: Normocephalic and atraumatic.  Mouth/Throat: No oropharyngeal exudate.  Eyes: Pupils are equal, round, and reactive to light.  Neck: Normal range of motion. Neck supple.  Cardiovascular: Normal rate, regular rhythm and normal heart sounds.  Exam reveals no gallop and no friction rub.   No murmur heard. Pulmonary/Chest: Effort normal and breath sounds normal. No respiratory distress. He has no wheezes. He has no rales.  Abdominal: Soft. Bowel sounds are normal. He exhibits no distension and no mass. There is no tenderness. There is no rebound and no guarding.  Musculoskeletal: Normal range of motion. He exhibits no edema and no tenderness.  Neurological: He is alert and oriented to person, place, and time.  Skin: Skin is warm and dry.  Psychiatric: He has a normal mood and affect.    ED Course   Procedures (including critical care time)  Labs Reviewed  BASIC METABOLIC PANEL - Abnormal; Notable for the following:    Glucose, Bld 106 (*)    GFR calc non Af Amer 58 (*)    GFR calc Af Amer 67 (*)    All other components within normal limits  CBC WITH DIFFERENTIAL - Abnormal; Notable for the following:    Eosinophils Relative 6 (*)    All other components within normal limits  URINALYSIS, ROUTINE W REFLEX MICROSCOPIC - Abnormal; Notable for the following:    Hgb urine dipstick SMALL (*)    All other components within normal limits  URINE MICROSCOPIC-ADD ON - Abnormal; Notable for the following:    Crystals CA OXALATE CRYSTALS (*)    All other components within normal limits  URINE CULTURE   Ct Abdomen Pelvis Wo Contrast  06/17/2013   *RADIOLOGY REPORT*  Clinical Data: Left  flank pain and dysuria  CT ABDOMEN AND PELVIS WITHOUT CONTRAST  Technique:  Multidetector CT imaging of the abdomen and pelvis was performed following the standard protocol without intravenous contrast.  Comparison: Ultrasound, 05/10/2011  Findings: There is a 1-2 mm stone at the left ureterovesicular junction leading to mild left hydroureter nephrosis and minor left perinephric stranding. No other ureteral stones.  No intrarenal stones.  No renal masses.  The right renal collecting system and ureter are unremarkable.  The prostate is mildly enlarged.  There is a small hiatal hernia. There are minor lung base reticular opacities most consistent with scarring.  The heart is normal in size.  Normal liver, spleen and pancreas.  No bile duct dilation.  The gallbladder is surgically absent.  No adrenal masses.  No adenopathy.  No abnormal fluid collections.  Normal bowel. There are degenerative changes of the visualized spine, relatively mild greatest at L5-S1.  No osteoblastic or osteolytic lesions. Benign appearing lucent lesion with sclerotic margins lies in the medial right ilium.  IMPRESSION: 1-2 millimeters stone at the right ureterovesicular junction leads to mild right hydroureter nephrosis.  No other acute findings.   Original Report Authenticated By: Amie Portland, M.D.   1. Ureteropelvic junction calculus     MDM  Pt is a 79 y.o. male with Pmhx as above who presents with 1 days of intermittent L flank pain, radiation to LLQ, dysuria.  No fever, chills, n/v, inability to void.  Hx of kidney stones.  Ddx includes UTI, nephrolithiasis w/, w/o obstruction, diverticulitis. W/U revealed L sided 1-70mm UVP stone.  Cr nml, no UTI.  Pt should be able to pass stone.  Rx for norco given.  Pt will drink plenty of fluids.  Return precautions given for new or worsening symptoms including fever, inability or decreased urination, n/v, poor PO intake.    1. Ureteropelvic junction calculus       Shanna Cisco,  MD 06/18/13 [phone]

## 2013-06-17 NOTE — ED Notes (Signed)
Per pt, had some burning with urination this am-no pain till this evening-has a history of kidney stones, last one 15 years ago

## 2013-06-18 LAB — URINE CULTURE
Colony Count: NO GROWTH
Culture: NO GROWTH

## 2013-08-10 ENCOUNTER — Encounter: Payer: Self-pay | Admitting: Internal Medicine

## 2013-08-10 ENCOUNTER — Ambulatory Visit (INDEPENDENT_AMBULATORY_CARE_PROVIDER_SITE_OTHER): Payer: Medicare Other | Admitting: Internal Medicine

## 2013-08-10 VITALS — BP 169/81 | HR 65 | Temp 97.9°F | Resp 18 | Wt 174.6 lb

## 2013-08-10 DIAGNOSIS — F411 Generalized anxiety disorder: Secondary | ICD-10-CM

## 2013-08-10 DIAGNOSIS — F39 Unspecified mood [affective] disorder: Secondary | ICD-10-CM

## 2013-08-10 DIAGNOSIS — I10 Essential (primary) hypertension: Secondary | ICD-10-CM

## 2013-08-10 DIAGNOSIS — R4586 Emotional lability: Secondary | ICD-10-CM

## 2013-08-10 DIAGNOSIS — T887XXA Unspecified adverse effect of drug or medicament, initial encounter: Secondary | ICD-10-CM

## 2013-08-10 MED ORDER — CITALOPRAM HYDROBROMIDE 20 MG PO TABS: 20.0000 mg | ORAL_TABLET | Freq: Every day | ORAL | Status: DC

## 2013-08-10 MED ORDER — LOSARTAN POTASSIUM 50 MG PO TABS: 50.0000 mg | ORAL_TABLET | Freq: Every day | ORAL | Status: DC

## 2013-08-10 NOTE — Progress Notes (Signed)
  Subjective:    Patient ID: Mark Copeland, male    DOB: 04-29-1934, 79 y.o.   MRN: [id number]  HPI CHRONIC HYPERTENSION follow-up:  Home blood pressure range 140/60-150/80  Adverse effects noted from medication as acid indigestion, flatus, constipation alternating with voluminous BMs, nocturia , & night sweats.Patient is NON compliant with amlodipine as of last week due to symptoms which resolved off Amlodipine.    Exercise program as walking & biking daily for 60-75 minutes  On low-salt diet           Review of Systems No chest pain, palpitations, dyspnea, claudication,edema or paroxysmal nocturnal dyspnea described  No significant lightheadedness, headache, epistaxis, or syncope  He describes increased anger & anxiety with verbal altercations. Citalopram was of benefit in past , but Rx not refilled.  He denies significant hoarseness, dysphagia, weight loss, melena, or rectal bleeding      Objective:   Physical Exam   Appears thin but healthy and well-nourished & in no acute distress.Appears younger than stated age  No oropharyngeal erythema  No carotid bruits are present.No neck pain distention present at 10 - 15 degrees. Thyroid normal to palpation  Heart rhythm and rate are normal with no significant murmurs or gallops.  Chest is clear with no increased work of breathing  There is no evidence of aortic aneurysm or renal artery bruits  Abdomen soft with no organomegaly or masses. No HJR  No clubbing, cyanosis or edema present.  Pedal pulses are intact . No bruits  No ischemic skin changes are present . Nails healthy    Alert and oriented. Strength, tone, DTRs reflexes normal          Assessment & Plan:  #1HTN #2 IBS #3 anxiety/ mood swings.The pathophysiology of neurotransmitter deficiency was discussed along with the benefits and potential adverse effects of SSRI therapy. See orders

## 2013-08-10 NOTE — Patient Instructions (Addendum)
Please take the probiotic , Align, every day until the bowels are normal. This will replace the normal bacteria which  are necessary for formation of normal stool and processing of food. Minimal Blood Pressure Goal= AVERAGE < 140/90;  Ideal is an AVERAGE < 135/85. This AVERAGE should be calculated from @ least 5-7 BP readings taken @ different times of day on different days of week. You should not respond to isolated BP readings , but rather the AVERAGE for that week .Please bring your  blood pressure cuff to office visits to verify that it is reliable.It  can also be checked against the blood pressure device at the pharmacy.

## 2013-08-12 LAB — T4, FREE: Free T4: 0.85 ng/dL (ref 0.60–1.60)

## 2013-08-12 LAB — TSH: TSH: 1.18 u[IU]/mL (ref 0.35–5.50)

## 2013-09-06 ENCOUNTER — Other Ambulatory Visit: Payer: Self-pay | Admitting: *Deleted

## 2013-09-06 MED ORDER — LOSARTAN POTASSIUM 50 MG PO TABS: 50.0000 mg | ORAL_TABLET | Freq: Every day | ORAL | Status: DC

## 2013-09-09 ENCOUNTER — Other Ambulatory Visit (HOSPITAL_COMMUNITY): Payer: Self-pay | Admitting: Urology

## 2013-09-09 DIAGNOSIS — N3010 Interstitial cystitis (chronic) without hematuria: Secondary | ICD-10-CM

## 2013-09-09 DIAGNOSIS — N320 Bladder-neck obstruction: Secondary | ICD-10-CM

## 2013-09-14 ENCOUNTER — Ambulatory Visit (HOSPITAL_COMMUNITY)
Admission: RE | Admit: 2013-09-14 | Discharge: 2013-09-14 | Disposition: A | Discharge status: Home / Self Care | Payer: Medicare Other | Source: Ambulatory Visit | Attending: Urology | Admitting: Urology

## 2013-09-14 DIAGNOSIS — N3010 Interstitial cystitis (chronic) without hematuria: Secondary | ICD-10-CM

## 2013-09-14 DIAGNOSIS — N320 Bladder-neck obstruction: Secondary | ICD-10-CM

## 2013-09-14 DIAGNOSIS — N200 Calculus of kidney: Secondary | ICD-10-CM | POA: Insufficient documentation

## 2013-09-16 ENCOUNTER — Other Ambulatory Visit: Payer: Self-pay

## 2014-02-23 ENCOUNTER — Ambulatory Visit (INDEPENDENT_AMBULATORY_CARE_PROVIDER_SITE_OTHER): Payer: Medicare Other | Admitting: Internal Medicine

## 2014-02-23 ENCOUNTER — Telehealth: Payer: Self-pay | Admitting: Internal Medicine

## 2014-02-23 ENCOUNTER — Encounter: Payer: Self-pay | Admitting: Internal Medicine

## 2014-02-23 VITALS — BP 162/92 | HR 60 | Temp 97.2°F | Wt 173.4 lb

## 2014-02-23 DIAGNOSIS — I7300 Raynaud's syndrome without gangrene: Secondary | ICD-10-CM

## 2014-02-23 DIAGNOSIS — IMO0002 Reserved for concepts with insufficient information to code with codable children: Secondary | ICD-10-CM

## 2014-02-23 DIAGNOSIS — M5414 Radiculopathy, thoracic region: Secondary | ICD-10-CM

## 2014-02-23 DIAGNOSIS — I10 Essential (primary) hypertension: Secondary | ICD-10-CM

## 2014-02-23 MED ORDER — LOSARTAN POTASSIUM 50 MG PO TABS: 50.0000 mg | ORAL_TABLET | Freq: Every day | ORAL | Status: DC

## 2014-02-23 MED ORDER — LOSARTAN POTASSIUM-HCTZ 50-12.5 MG PO TABS
1.0000 | ORAL_TABLET | Freq: Every day | ORAL | Status: DC
Start: 2014-02-23 — End: 2014-05-24

## 2014-02-23 NOTE — Patient Instructions (Addendum)
Minimal Blood Pressure Goal= AVERAGE < 140/90;  Ideal is an AVERAGE < 135/85. This AVERAGE should be calculated from @ least 5-7 BP readings taken @ different times of day on different days of week. You should not respond to isolated BP readings , but rather the AVERAGE for that week .Please bring your  blood pressure cuff to office visits to verify that it is reliable.It  can also be checked against the blood pressure device at the pharmacy. Finger or wrist cuffs are not dependable; an arm cuff is. Protect  hands, feet, and ears from cold exposure including ice chests. Use silk sock & mitten liners; this can be purchased at outdoor supply stores. The best exercises for the back include freestyle swimming, stretch aerobics, and yoga.Cybex & Nautilus machines rather than dead weights are better for the back.

## 2014-02-23 NOTE — Progress Notes (Signed)
   Subjective:    Patient ID: Mark Copeland, male    DOB: July 08, 1934, 80 y.o.   MRN: [id number]  HPI   He describes intermittent numbness in the medial aspect of the upper extremities bilaterally over the last 2 weeks. There was no specific trigger or injury for this. He describes this as a dull sensation.  Additionally he describes tingling in his hands when hands are exposed to cold. There is associated redness. By history he's been intolerant of amlodipine.  His blood pressures have been averaging 160/80.      Review of Systems  He specifically denies fever, chills, sweats, or weight loss.  There is no redness or swelling in the area of the upper arm symptoms. There are no symptoms in the forearms or hands.He also denies joint stiffness.  There's been no change in the color or temperature the skin.  He has no associated myalgias or weakness in the upper extremities.  He has no incontinence of urine or stool.  He said no lymphadenopathy, abnormal bruising, or abnormal bleeding.      Objective:   Physical Exam Gen.: Healthy and well-nourished in appearance. Alert, appropriate and cooperative throughout exam. Appears younger than stated age  Head: Normocephalic without obvious abnormalities;  pattern alopecia  Eyes: No corneal or conjunctival inflammation noted.    Neck: No deformities, masses, or tenderness noted. Range of motion normal. Lungs: Normal respiratory effort; chest expands symmetrically. Lungs are clear to auscultation without rales, wheezes, or increased work of breathing. Heart: Normal rate and rhythm. Normal S1 and S2. No gallop, click, or rub.No murmur. Abdomen: Bowel sounds normal; abdomen soft and nontender. No masses, organomegaly or hernias noted.No AAA                                 Musculoskeletal/extremities: Accentuated curvature of upper/mid thoracic spine. No clubbing, cyanosis, edema, or significant extremity  deformity noted. Range of motion  normal .Tone & strength normal. Hand joints normal  Fingernail  health good. Able to lie down & sit up w/o help. Negative SLR bilaterally Vascular: Carotid, radial artery, dorsalis pedis and  posterior tibial pulses are full and equal. No bruits present. Hands cool w/o ischemic change Neurologic: Alert and oriented x3. Deep tendon reflexes symmetrical and normal. No cervical nerve deficit Gait normal  Skin: Intact without suspicious lesions or rashes. Lymph: No cervical, axillary lymphadenopathy present. Psych: Mood and affect are normal. Normally interactive                                                                                        Assessment & Plan:  #1 thoracic radiculopathy, this is most likely related to excess curvature of the thoracic spine with positional neural  impingement  #2 Raynaud's phenomena  #3 amlodipine intolerance  #4 hypertension, uncontrolled  Plan: See  Orders and AVS. If the thoracic symptoms persist; imaging will be performed.

## 2014-02-23 NOTE — Progress Notes (Signed)
Pre visit review using our clinic review tool, if applicable. No additional management support is needed unless otherwise documented below in the visit note. 

## 2014-02-23 NOTE — Telephone Encounter (Signed)
Relevant patient education assigned to patient using Emmi. ° °

## 2014-02-24 ENCOUNTER — Other Ambulatory Visit: Payer: Self-pay

## 2014-02-24 MED ORDER — CITALOPRAM HYDROBROMIDE 20 MG PO TABS: 20.0000 mg | ORAL_TABLET | Freq: Every day | ORAL | Status: DC

## 2014-05-12 ENCOUNTER — Other Ambulatory Visit: Payer: Self-pay | Admitting: Dermatology

## 2014-05-24 ENCOUNTER — Other Ambulatory Visit (INDEPENDENT_AMBULATORY_CARE_PROVIDER_SITE_OTHER): Payer: Medicare Other

## 2014-05-24 ENCOUNTER — Encounter: Payer: Self-pay | Admitting: Internal Medicine

## 2014-05-24 ENCOUNTER — Ambulatory Visit (INDEPENDENT_AMBULATORY_CARE_PROVIDER_SITE_OTHER): Payer: Medicare Other | Admitting: Internal Medicine

## 2014-05-24 VITALS — BP 150/90 | HR 59 | Temp 97.8°F | Ht 70.0 in | Wt 174.8 lb

## 2014-05-24 DIAGNOSIS — Z86010 Personal history of colonic polyps: Secondary | ICD-10-CM

## 2014-05-24 DIAGNOSIS — I10 Essential (primary) hypertension: Secondary | ICD-10-CM

## 2014-05-24 DIAGNOSIS — E785 Hyperlipidemia, unspecified: Secondary | ICD-10-CM

## 2014-05-24 DIAGNOSIS — Z0000 Encounter for general adult medical examination without abnormal findings: Secondary | ICD-10-CM

## 2014-05-24 LAB — HEPATIC FUNCTION PANEL
ALK PHOS: 70 U/L (ref 39–117)
ALT: 16 U/L (ref 0–53)
AST: 22 U/L (ref 0–37)
Albumin: 4.5 g/dL (ref 3.5–5.2)
BILIRUBIN TOTAL: 1.1 mg/dL (ref 0.2–1.2)
Bilirubin, Direct: 0.1 mg/dL (ref 0.0–0.3)
Total Protein: 7.3 g/dL (ref 6.0–8.3)

## 2014-05-24 LAB — CBC WITH DIFFERENTIAL/PLATELET
BASOS ABS: 0.1 10*3/uL (ref 0.0–0.1)
Basophils Relative: 1.3 % (ref 0.0–3.0)
Eosinophils Absolute: 0.6 10*3/uL (ref 0.0–0.7)
Eosinophils Relative: 6.7 % — ABNORMAL HIGH (ref 0.0–5.0)
HCT: 48.3 % (ref 39.0–52.0)
Hemoglobin: 16.2 g/dL (ref 13.0–17.0)
LYMPHS ABS: 3.3 10*3/uL (ref 0.7–4.0)
LYMPHS PCT: 37.5 % (ref 12.0–46.0)
MCHC: 33.6 g/dL (ref 30.0–36.0)
MCV: 90.9 fl (ref 78.0–100.0)
MONOS PCT: 8 % (ref 3.0–12.0)
Monocytes Absolute: 0.7 10*3/uL (ref 0.1–1.0)
Neutro Abs: 4.1 10*3/uL (ref 1.4–7.7)
Neutrophils Relative %: 46.5 % (ref 43.0–77.0)
PLATELETS: 232 10*3/uL (ref 150.0–400.0)
RBC: 5.32 Mil/uL (ref 4.22–5.81)
RDW: 13.5 % (ref 11.5–15.5)
WBC: 8.8 10*3/uL (ref 4.0–10.5)

## 2014-05-24 LAB — BASIC METABOLIC PANEL
BUN: 18 mg/dL (ref 6–23)
CALCIUM: 9.7 mg/dL (ref 8.4–10.5)
CO2: 27 mEq/L (ref 19–32)
CREATININE: 1 mg/dL (ref 0.4–1.5)
Chloride: 102 mEq/L (ref 96–112)
GFR: 77.7 mL/min (ref >60.00)
GLUCOSE: 102 mg/dL — AB (ref 70–99)
Potassium: 4.3 mEq/L (ref 3.5–5.1)
Sodium: 139 mEq/L (ref 135–145)

## 2014-05-24 LAB — LIPID PANEL
CHOLESTEROL: 173 mg/dL (ref 0–200)
HDL: 51 mg/dL (ref >39.00)
LDL CALC: 99 mg/dL (ref 0–99)
NonHDL: 122
Total CHOL/HDL Ratio: 3
Triglycerides: 115 mg/dL (ref 0.0–149.0)
VLDL: 23 mg/dL (ref 0.0–40.0)

## 2014-05-24 LAB — TSH: TSH: 0.92 u[IU]/mL (ref 0.35–4.50)

## 2014-05-24 MED ORDER — AMLODIPINE BESYLATE 5 MG PO TABS: 5.0000 mg | ORAL_TABLET | Freq: Every day | ORAL | Status: DC

## 2014-05-24 MED ORDER — LOSARTAN POTASSIUM-HCTZ 50-12.5 MG PO TABS: 1.0000 | ORAL_TABLET | Freq: Every day | ORAL | Status: DC

## 2014-05-24 NOTE — Assessment & Plan Note (Signed)
Blood pressure goals reviewed. BMET 

## 2014-05-24 NOTE — Assessment & Plan Note (Signed)
Lipids, LFTs, TSH  

## 2014-05-24 NOTE — Assessment & Plan Note (Signed)
As per Dr Amalia Hailey

## 2014-05-24 NOTE — Progress Notes (Signed)
 Subjective:    Patient ID: Mark Copeland, male    DOB: 04/29/1934, 80 y.o.   MRN: [id number]  HPI  [employer]HC/Medicare Wellness Visit: Psychosocial and medical history were reviewed as required by Medicare (history related to abuse, antisocial behavior , firearm risk). Social history: Caffeine:none  , Alcohol :occa wine  , Tobacco use:quit 1961 Exercise:walking daily  & biking qod for 6-8 miles Personal safety/fall risk:no Limitations of activities of daily living:no Seatbelt/ smoke alarm use:yes Healthcare Power of Attorney/Living Will status: UTD Ophthalmologic exam status:UTD Hearing evaluation status:not UTD Orientation: Oriented X 3 Memory and recall: good Spelling or math testing: good Depression/anxiety assessment: no Foreign travel history:[place] 2013 Immunization status for influenza/pneumonia/ shingles /tetanus: UTD Transfusion history:no Preventive health care maintenance status: Colonoscopy as per protocol/standard care:see Problem List Dental care:every 6 mos Chart reviewed and updated. Active issues reviewed and addressed as documented below.  Blood pressure range / average : 120/70-130/80 ;his cuff correlated with BP readings here Compliant with anti hypertemsive medication. No lightheadedness or other adverse medication effect described.  A heart healthy /low salt diet is followed. Family history is + for HTN /CVA in parents late in life.    Review of Systems    Significant headaches, epistaxis, chest pain, palpitations, exertional dyspnea, claudication, paroxysmal nocturnal dyspnea, or edema absent.   Unexplained weight loss, abdominal pain, significant dyspepsia, dysphagia, melena, rectal bleeding, or persistently small caliber stools are denied.     Objective:   Physical Exam  Gen.: Healthy and well-nourished in appearance. Alert, appropriate and cooperative throughout exam. Appears younger than stated age  Head: Normocephalic without obvious  abnormalities; pattern alopecia  Eyes: No corneal or conjunctival inflammation noted. Pupils equal round reactive to light and accommodation. Extraocular motion intact.  Ears: External  ear exam reveals no significant lesions or deformities. Canals clear .TMs normal. Hearing is grossly normal bilaterally. Nose: External nasal exam reveals no deformity or inflammation. Nasal mucosa are pink and moist. No lesions or exudates noted.   Mouth: Oral mucosa and oropharynx reveal no lesions or exudates. Teeth in good repair. Neck: No deformities, masses, or tenderness noted. Range of motion & Thyroid normal. Lungs: Normal respiratory effort; chest expands symmetrically. Lungs are clear to auscultation without rales, wheezes, or increased work of breathing.Barrel chest. Heart: Slow rate and regular rhythm. Normal S1 and S2. No gallop, click, or rub. No murmur. Abdomen: Bowel sounds normal; abdomen soft and nontender. No masses, organomegaly or hernias noted. Genitalia: as per Dr Evans                             Musculoskeletal/extremities: No deformity or scoliosis noted of  the thoracic or lumbar spine.   No clubbing, cyanosis, edema, or significant extremity  deformity noted. Range of motion normal .Tone & strength normal. Hand joints normal  Fingernail  health good. Able to lie down & sit up w/o help. Negative SLR bilaterally to 90 degrees. Vascular: Carotid, radial artery, dorsalis pedis and  posterior tibial pulses are full and equal. No bruits present. Neurologic: Alert and oriented x3. Deep tendon reflexes symmetrical and normal.  Gait normal .      Skin: Intact without suspicious lesions or rashes. Lymph: No cervical, axillary lymphadenopathy present. Psych: Mood and affect are normal. Normally interactive                                                                                          Assessment & Plan:  #1 Medicare Wellness Exam; criteria met ; data entered #2 See Current  Assessment & Plan in Problem List under specific DiagnosisThe labs will be reviewed and risks and options assessed. WRITTEN recommendations will be provided  directly through My Chart.Further evaluation or change in medical therapy will be directed by those results. 

## 2014-05-24 NOTE — Patient Instructions (Signed)
Minimal Blood Pressure Goal= AVERAGE < 140/90;  Ideal is an AVERAGE < 135/85. This AVERAGE should be calculated from @ least 5-7 BP readings taken @ different times of day on different days of week. You should not respond to isolated BP readings , but rather the AVERAGE for that week .Please bring your  blood pressure cuff to office visits to verify that it is reliable.It  can also be checked against the blood pressure device at the pharmacy. Finger or wrist cuffs are not dependable; an arm cuff is.   Your next office appointment will be determined based upon review of your pending labss. Those instructions will be transmitted to you through My Chart .

## 2014-05-24 NOTE — Progress Notes (Signed)
Pre visit review using our clinic review tool, if applicable. No additional management support is needed unless otherwise documented below in the visit note. 

## 2014-05-24 NOTE — Assessment & Plan Note (Signed)
CBC & dif 

## 2014-05-24 NOTE — Assessment & Plan Note (Signed)
Annual monitor by Dr Amalia Hailey

## 2014-05-24 NOTE — Progress Notes (Signed)
After taking patient's blood pressure with the office cuff the first time I then took it with his home cuff and it read 168/98. Left arm  After taking patient's blood pressure with the office cuff the second time I then took it with his home cuff and it read 150/80. Right arm  Blood pressure readings taken with the office cuff are documented under vitals.

## 2014-05-25 ENCOUNTER — Ambulatory Visit: Payer: Medicare Other

## 2014-05-25 ENCOUNTER — Telehealth: Payer: Self-pay

## 2014-05-25 DIAGNOSIS — R7309 Other abnormal glucose: Secondary | ICD-10-CM

## 2014-05-25 LAB — HEMOGLOBIN A1C: Hgb A1c MFr Bld: 5.8 % (ref 4.6–6.5)

## 2014-05-25 NOTE — Telephone Encounter (Signed)
Message copied by Shelly Coss on Wed May 25, 2014  8:49 AM ------      Message from: Hendricks Limes      Created: Tue May 24, 2014  6:09 PM       Please add A1c (790.29)       ------

## 2014-05-25 NOTE — Telephone Encounter (Signed)
Request has been faxed to lab  

## 2014-05-30 ENCOUNTER — Ambulatory Visit (INDEPENDENT_AMBULATORY_CARE_PROVIDER_SITE_OTHER): Payer: Medicare Other | Admitting: Podiatry

## 2014-05-30 ENCOUNTER — Encounter: Payer: Self-pay | Admitting: Podiatry

## 2014-05-30 ENCOUNTER — Ambulatory Visit: Payer: Medicare Other

## 2014-05-30 ENCOUNTER — Ambulatory Visit (INDEPENDENT_AMBULATORY_CARE_PROVIDER_SITE_OTHER): Payer: Medicare Other

## 2014-05-30 VITALS — BP 139/82 | HR 75 | Resp 18

## 2014-05-30 DIAGNOSIS — M722 Plantar fascial fibromatosis: Secondary | ICD-10-CM

## 2014-05-30 DIAGNOSIS — R52 Pain, unspecified: Secondary | ICD-10-CM

## 2014-05-30 NOTE — Progress Notes (Signed)
° °  Subjective:    Patient ID: Mark Copeland, male    DOB: 1934/07/27, 80 y.o.   MRN: [id number]  HPI I AM HAVING SOME HEEL PAIN ON THE RIGHT FOOT AND HURTS ON THE BOTTOM AND HAS BEEN GOING ON FOR ABOUT 3 WEEKS AND WALKS AND BICYCLE RIDES AND THAT IS WHEN I NOTICE AND BURNS AND THROBS AND GET NUMB AND TINGLE AND MY LEFT FOOT IS SORE AND HAS BEEN GOING ON FOR ABOUT A WEEK  This patient presents today primarily because of right heel arch pain aggravated with standing and walking with rest. He has reduced his normal walking which is 3 miles every other day and bike riding every other day. He matches some dorsal left foot pain as well with weightbearing Patient describes changing shoes doing you or running shoe and wearing a soft arch pad which aggravates his right heel arch pain Review of Systems  Hematological: Bruises/bleeds easily.  All other systems reviewed and are negative.      Objective:   Physical Exam Orientated x3 white male  Vascular: DP and PT pulses 2/4 bilaterally  Neurological: Ankle reflex equal and reactive bilaterally  Dermatological: Texture and turgor within normal limits  Musculoskeletal: Exquisite palpable tenderness medial plantar and central right heel and proximal one third of the medial fascial band, without a palpable lesions. Mild palpable tenderness second MPJ area without a palpable lesions  X-ray examination weightbearing right foot  Intact bony structure without fracture and/or dislocation noted  Posterior and inferior calcaneal spurs  Hammertoe deformity second  Taylor's bunion  Radiographic impression:  No acute bony abnormality noted in the right foot  X-ray report weightbearing left foot  Intact bony structure without a fracture and/or dislocation  Taylor's bunion  Posterior and inferior calcaneal spurs  Lateral deviation on the transverse plane third toe  Radiographic impression:  No acute bony abnormality noted in the left  foot       Assessment & Plan:    Assessment: Plantar fasciitis right Satisfactory neurovascular status Strain left foot most likely to accommodate  the painful right heel/arch  Plan: Advised patient to DC walking and ridet his bike on a daily basis until  heel pain and arch pain improve Recommended over-the-counter 200 mg ibuprofen tablets 3 times a day x7 days Shoeing and stretching discussed Do not wear the soft arch pad until right heel and arch pain improve  The skin is prepped with alcohol and 10 mg of plain Xylocaine mixed with 2.5 mg of plain Marcaine and 10 mg of Kenalog are injected inferior heel left for Kenalog injection #1  Reappoint recommended your patient if symptoms are not improving the next 30 days

## 2014-05-30 NOTE — Patient Instructions (Signed)
200 mg ibuprofen tablets Take 2 tablets by mouth 3 times a day x7 days Substitute biking for walking Wear shoes at all times Bent knee stretching with shoes on 3 times a day x2 minutes In a seated position toes toward your nose Return if symptoms do not improve in 30 days Plantar Fasciitis Plantar fasciitis is a common condition that causes foot pain. It is soreness (inflammation) of the band of tough fibrous tissue on the bottom of the foot that runs from the heel bone (calcaneus) to the ball of the foot. The cause of this soreness may be from excessive standing, poor fitting shoes, running on hard surfaces, being overweight, having an abnormal walk, or overuse (this is common in runners) of the painful foot or feet. It is also common in aerobic exercise dancers and ballet dancers. SYMPTOMS  Most people with plantar fasciitis complain of:  Severe pain in the morning on the bottom of their foot especially when taking the first steps out of bed. This pain recedes after a few minutes of walking.  Severe pain is experienced also during walking following a long period of inactivity.  Pain is worse when walking barefoot or up stairs DIAGNOSIS   Your caregiver will diagnose this condition by examining and feeling your foot.  Special tests such as X-rays of your foot, are usually not needed. PREVENTION   Consult a sports medicine professional before beginning a new exercise program.  Walking programs offer a good workout. With walking there is a lower chance of overuse injuries common to runners. There is less impact and less jarring of the joints.  Begin all new exercise programs slowly. If problems or pain develop, decrease the amount of time or distance until you are at a comfortable level.  Wear good shoes and replace them regularly.  Stretch your foot and the heel cords at the back of the ankle (Achilles tendon) both before and after exercise.  Run or exercise on even surfaces that  are not hard. For example, asphalt is better than pavement.  Do not run barefoot on hard surfaces.  If using a treadmill, vary the incline.  Do not continue to workout if you have foot or joint problems. Seek professional help if they do not improve. HOME CARE INSTRUCTIONS   Avoid activities that cause you pain until you recover.  Use ice or cold packs on the problem or painful areas after working out.  Only take over-the-counter or prescription medicines for pain, discomfort, or fever as directed by your caregiver.  Soft shoe inserts or athletic shoes with air or gel sole cushions may be helpful.  If problems continue or become more severe, consult a sports medicine caregiver or your own health care provider. Cortisone is a potent anti-inflammatory medication that may be injected into the painful area. You can discuss this treatment with your caregiver. MAKE SURE YOU:   Understand these instructions.  Will watch your condition.  Will get help right away if you are not doing well or get worse. Document Released: 07/23/2001 Document Revised: 01/20/2012 Document Reviewed: 09/21/2008 [facility] Patient Information 2015 [place], [place]. This information is not intended to replace advice given to you by your health care provider. Make sure you discuss any questions you have with your health care provider.

## 2014-05-31 DIAGNOSIS — M722 Plantar fascial fibromatosis: Secondary | ICD-10-CM

## 2014-05-31 MED ORDER — TRIAMCINOLONE ACETONIDE 10 MG/ML IJ SUSP
10.0000 mg | Freq: Once | INTRAMUSCULAR | Status: AC
  Administered 2014-05-31: 10 mg

## 2014-06-15 ENCOUNTER — Ambulatory Visit: Payer: Self-pay | Admitting: Podiatry

## 2015-04-04 ENCOUNTER — Ambulatory Visit (INDEPENDENT_AMBULATORY_CARE_PROVIDER_SITE_OTHER): Payer: Medicare Other | Admitting: Internal Medicine

## 2015-04-04 ENCOUNTER — Encounter: Payer: Self-pay | Admitting: Internal Medicine

## 2015-04-04 VITALS — BP 148/78 | HR 62 | Temp 97.6°F | Resp 14 | Wt 170.5 lb

## 2015-04-04 DIAGNOSIS — R739 Hyperglycemia, unspecified: Secondary | ICD-10-CM

## 2015-04-04 DIAGNOSIS — E785 Hyperlipidemia, unspecified: Secondary | ICD-10-CM

## 2015-04-04 DIAGNOSIS — I10 Essential (primary) hypertension: Secondary | ICD-10-CM

## 2015-04-04 DIAGNOSIS — Z860100 Personal history of colon polyps, unspecified: Secondary | ICD-10-CM

## 2015-04-04 DIAGNOSIS — Z23 Encounter for immunization: Secondary | ICD-10-CM | POA: Diagnosis not present

## 2015-04-04 MED ORDER — LOSARTAN POTASSIUM-HCTZ 50-12.5 MG PO TABS: 1.0000 | ORAL_TABLET | Freq: Every day | ORAL | Status: DC

## 2015-04-04 MED ORDER — AMLODIPINE BESYLATE 5 MG PO TABS: 5.0000 mg | ORAL_TABLET | Freq: Every day | ORAL | Status: DC

## 2015-04-04 NOTE — Progress Notes (Signed)
Pre visit review using our clinic review tool, if applicable. No additional management support is needed unless otherwise documented below in the visit note. 

## 2015-04-04 NOTE — Progress Notes (Signed)
Subjective:    Patient ID: Mark Copeland, male    DOB: Jan 25, 1934, 81 y.o.   MRN: [id number]  HPI The patient is here to assess status of active health conditions.  PMH, FH, & Social History reviewed & updated.  He has been compliant with his medications without adverse effects. He is on a heart healthy ,sodium restricted diet. Blood pressure ranges 130/64-140/70. He has been walking 2 miles 6 days a week without cardiopulmonary symptoms.  His father did have coronary disease and had TIAs. There is no history of premature heart attack or stroke.  He smoked 1958-1961 up to 2 cigars a day. He has an occasional alcohol beverage.  Dr Marisa Hua, told him he has aged out of colonoscopies. Other than occasional thin stools and occasional loose stools he has no GI symptoms. He does take simethicone as needed for "gas".  He has seen Dr. Amalia Hailey for interstitial cystitis. He does have nocturia 2-3 times per night but no other GU symptoms.    Review of Systems   Chest pain, palpitations, tachycardia, exertional dyspnea, paroxysmal nocturnal dyspnea, claudication or edema are absent.  Unexplained weight loss, abdominal pain, significant dyspepsia, dysphagia, melena, rectal bleeding, or persistently small caliber stools are denied.Dysuria, pyuria, hematuria, frequency,  polyuria are denied.    Objective:   Physical Exam   Gen.: Adequately nourished in appearance. Alert, appropriate and cooperative throughout exam. Appears younger than stated age  Head: Normocephalic without obvious abnormalities; pattern alopecia  Eyes: No corneal or conjunctival inflammation noted. Pupils equal round reactive to light and accommodation. Extraocular motion intact.  Ears: External  ear exam reveals no significant lesions or deformities. Canals clear .TMs normal. Hearing is grossly normal bilaterally. Nose: External nasal exam reveals no deformity or inflammation. Nasal mucosa are pink and moist. No lesions  or exudates noted.   Mouth: Oral mucosa and oropharynx reveal no lesions or exudates. Teeth in good repair. Neck: No deformities, masses, or tenderness noted. Range of motion & mal respiratory effort; chest expands symmetrically. Lungs are clear to auscultation without rales, wheezes, or increased work of breathing. Heart: Slow  rate and regular rhythm. Normal S1 and S2. No gallop, click, or rub. No murmur. Abdomen: Bowel sounds normal; abdomen soft and nontender. No masses, organomegaly or hernias noted. Genitalia: as per Dr Amalia Hailey                         Musculoskeletal/extremities: No deformity or scoliosis noted of  the thoracic or lumbar spine.  No clubbing, cyanosis, edema, or significant extremity  deformity noted.  Range of motion normal . Tone & strength normal. Hand joints normal.  Fingernail  health good. Minimal crepitus of knees  Able to lie down & sit up w/o help.  Negative SLR bilaterally Vascular: Carotid, radial artery, dorsalis pedis and  posterior tibial pulses are full and equal. No bruits present. Neurologic: Alert and oriented x3. Deep tendon reflexes symmetrical and normal.  Gait normal     Skin: Intact without suspicious lesions or rashes. Lymph: No cervical, axillary lymphadenopathy present. Psych: Mood and affect are normal. Normally interactive  Assessment & Plan:  See Current Assessment & Plan in Problem List under specific Diagnosis

## 2015-04-04 NOTE — Patient Instructions (Signed)
  Your next office appointment will be determined based upon review of your pending labs in mid July 2016.Those written interpretation of the lab results and instructions will be transmitted to you by My Chart  Critical results will be called.   Followup as needed for any active or acute issue. Please report any significant change in your symptoms.  Please take a probiotic , Florastor OR Align, every day if the bowels are loose or if having excess gas. This will replace the normal bacteria which  are necessary for formation of normal stool and processing of food.

## 2015-04-04 NOTE — Assessment & Plan Note (Signed)
Lipids, LFTs, TSH  

## 2015-04-04 NOTE — Assessment & Plan Note (Signed)
A1c

## 2015-04-05 DIAGNOSIS — L820 Inflamed seborrheic keratosis: Secondary | ICD-10-CM | POA: Diagnosis not present

## 2015-04-05 DIAGNOSIS — L570 Actinic keratosis: Secondary | ICD-10-CM | POA: Diagnosis not present

## 2015-04-05 DIAGNOSIS — L821 Other seborrheic keratosis: Secondary | ICD-10-CM | POA: Diagnosis not present

## 2015-04-05 DIAGNOSIS — Z85828 Personal history of other malignant neoplasm of skin: Secondary | ICD-10-CM | POA: Diagnosis not present

## 2015-04-05 NOTE — Assessment & Plan Note (Signed)
CBC

## 2015-04-25 ENCOUNTER — Other Ambulatory Visit (INDEPENDENT_AMBULATORY_CARE_PROVIDER_SITE_OTHER): Payer: Medicare Other

## 2015-04-25 DIAGNOSIS — R739 Hyperglycemia, unspecified: Secondary | ICD-10-CM

## 2015-04-25 DIAGNOSIS — I10 Essential (primary) hypertension: Secondary | ICD-10-CM | POA: Diagnosis not present

## 2015-04-25 DIAGNOSIS — E785 Hyperlipidemia, unspecified: Secondary | ICD-10-CM | POA: Diagnosis not present

## 2015-04-25 DIAGNOSIS — Z86010 Personal history of colonic polyps: Secondary | ICD-10-CM

## 2015-04-25 LAB — HEPATIC FUNCTION PANEL
ALBUMIN: 4.4 g/dL (ref 3.5–5.2)
ALT: 12 U/L (ref 0–53)
AST: 16 U/L (ref 0–37)
Alkaline Phosphatase: 61 U/L (ref 39–117)
Bilirubin, Direct: 0.1 mg/dL (ref 0.0–0.3)
Total Bilirubin: 0.8 mg/dL (ref 0.2–1.2)
Total Protein: 6.7 g/dL (ref 6.0–8.3)

## 2015-04-25 LAB — LIPID PANEL
Cholesterol: 152 mg/dL (ref 0–200)
HDL: 40.1 mg/dL (ref >39.00)
LDL CALC: 93 mg/dL (ref 0–99)
NONHDL: 111.9
TRIGLYCERIDES: 95 mg/dL (ref 0.0–149.0)
Total CHOL/HDL Ratio: 4
VLDL: 19 mg/dL (ref 0.0–40.0)

## 2015-04-25 LAB — CBC WITH DIFFERENTIAL/PLATELET
Basophils Absolute: 0.1 10*3/uL (ref 0.0–0.1)
Basophils Relative: 1.2 % (ref 0.0–3.0)
EOS PCT: 9.3 % — AB (ref 0.0–5.0)
Eosinophils Absolute: 0.7 10*3/uL (ref 0.0–0.7)
HEMATOCRIT: 47.1 % (ref 39.0–52.0)
HEMOGLOBIN: 15.7 g/dL (ref 13.0–17.0)
LYMPHS PCT: 36.5 % (ref 12.0–46.0)
Lymphs Abs: 2.9 10*3/uL (ref 0.7–4.0)
MCHC: 33.4 g/dL (ref 30.0–36.0)
MCV: 90.8 fl (ref 78.0–100.0)
MONO ABS: 0.6 10*3/uL (ref 0.1–1.0)
MONOS PCT: 7.5 % (ref 3.0–12.0)
NEUTROS ABS: 3.7 10*3/uL (ref 1.4–7.7)
Neutrophils Relative %: 45.5 % (ref 43.0–77.0)
Platelets: 225 10*3/uL (ref 150.0–400.0)
RBC: 5.19 Mil/uL (ref 4.22–5.81)
RDW: 13 % (ref 11.5–15.5)
WBC: 8.1 10*3/uL (ref 4.0–10.5)

## 2015-04-25 LAB — BASIC METABOLIC PANEL
BUN: 17 mg/dL (ref 6–23)
CALCIUM: 9.6 mg/dL (ref 8.4–10.5)
CO2: 31 meq/L (ref 19–32)
Chloride: 102 mEq/L (ref 96–112)
Creatinine, Ser: 1.03 mg/dL (ref 0.40–1.50)
GFR: 74.05 mL/min (ref >60.00)
Glucose, Bld: 106 mg/dL — ABNORMAL HIGH (ref 70–99)
Potassium: 4.4 mEq/L (ref 3.5–5.1)
Sodium: 137 mEq/L (ref 135–145)

## 2015-04-25 LAB — HEMOGLOBIN A1C: HEMOGLOBIN A1C: 5.6 % (ref 4.6–6.5)

## 2015-04-25 LAB — TSH: TSH: 1.36 u[IU]/mL (ref 0.35–4.50)

## 2015-04-26 ENCOUNTER — Other Ambulatory Visit: Payer: Self-pay | Admitting: Internal Medicine

## 2015-07-18 DIAGNOSIS — H43813 Vitreous degeneration, bilateral: Secondary | ICD-10-CM | POA: Diagnosis not present

## 2015-07-18 DIAGNOSIS — H35363 Drusen (degenerative) of macula, bilateral: Secondary | ICD-10-CM | POA: Diagnosis not present

## 2015-07-18 DIAGNOSIS — H10413 Chronic giant papillary conjunctivitis, bilateral: Secondary | ICD-10-CM | POA: Diagnosis not present

## 2015-07-18 DIAGNOSIS — H2513 Age-related nuclear cataract, bilateral: Secondary | ICD-10-CM | POA: Diagnosis not present

## 2015-08-03 ENCOUNTER — Ambulatory Visit (INDEPENDENT_AMBULATORY_CARE_PROVIDER_SITE_OTHER): Payer: Medicare Other

## 2015-08-03 DIAGNOSIS — Z23 Encounter for immunization: Secondary | ICD-10-CM

## 2015-12-20 ENCOUNTER — Encounter: Payer: Self-pay | Admitting: Internal Medicine

## 2015-12-20 ENCOUNTER — Ambulatory Visit (INDEPENDENT_AMBULATORY_CARE_PROVIDER_SITE_OTHER): Payer: Medicare Other | Admitting: Internal Medicine

## 2015-12-20 VITALS — BP 138/70 | HR 57 | Temp 97.7°F | Resp 16 | Wt 175.0 lb

## 2015-12-20 DIAGNOSIS — I10 Essential (primary) hypertension: Secondary | ICD-10-CM

## 2015-12-20 DIAGNOSIS — Z85828 Personal history of other malignant neoplasm of skin: Secondary | ICD-10-CM | POA: Diagnosis not present

## 2015-12-20 DIAGNOSIS — N3010 Interstitial cystitis (chronic) without hematuria: Secondary | ICD-10-CM

## 2015-12-20 DIAGNOSIS — K219 Gastro-esophageal reflux disease without esophagitis: Secondary | ICD-10-CM

## 2015-12-20 MED ORDER — AMLODIPINE BESYLATE 5 MG PO TABS: 5.0000 mg | ORAL_TABLET | Freq: Every day | ORAL | Status: DC

## 2015-12-20 MED ORDER — LOSARTAN POTASSIUM-HCTZ 50-12.5 MG PO TABS: 1.0000 | ORAL_TABLET | Freq: Every day | ORAL | Status: DC

## 2015-12-20 NOTE — Patient Instructions (Signed)
It was nice to meet you.    Medications reviewed and updated.  No changes recommended at this time.  Your prescription(s) have been submitted to your pharmacy. Please take as directed and contact our office if you believe you are having problem(s) with the medication(s).  Please schedule your physical exam.

## 2015-12-20 NOTE — Assessment & Plan Note (Signed)
BP has been well controlled He does have an element of white coat htn Continue current meds

## 2015-12-20 NOTE — Assessment & Plan Note (Signed)
No current symptoms

## 2015-12-20 NOTE — Progress Notes (Signed)
Subjective:    Patient ID: Mark Copeland, male    DOB: 1934/09/17, 82 y.o.   MRN: [id number]  HPI He is here to establish with a new pcp.   He is here for follow up.  Hypertension: He is taking his medication daily. He is compliant with a low sodium diet.  He denies chest pain, palpitations, edema, shortness of breath and regular headaches. He is exercising regularly.   Allergies:  He takes his allergy medication as needed. He has occasional dizziness with his allergies, but if he takes his allergy medication it resolves quickly.   He has no concerns.     Medications and allergies reviewed with patient and updated if appropriate.  Patient Active Problem List   Diagnosis Date Noted  . Anxiety 03/03/2012  . Hypertension 11/13/2011  . Abnormal chest x-ray 11/13/2011  . NEPHROLITHIASIS, HX OF 10/14/2010  . Hyperglycemia 10/11/2010  . SKIN CANCER, HX OF 10/11/2010  . INTENTION TREMOR 09/15/2009  . BENIGN PROSTATIC HYPERTROPHY 09/15/2009  . History of colonic polyps 12/05/2008  . Hyperlipidemia 02/18/2008  . Esophageal reflux 02/18/2008  . INTERSTITIAL CYSTITIS 02/18/2008    Current Outpatient Prescriptions on File Prior to Visit  Medication Sig Dispense Refill  . amLODipine (NORVASC) 5 MG tablet Take 1 tablet (5 mg total) by mouth daily. 90 tablet 3  . fexofenadine (ALLEGRA) 180 MG tablet Take 180 mg by mouth daily as needed (allergies).    . fluticasone (FLONASE) 50 MCG/ACT nasal spray Place 2 sprays into the nose 2 (two) times daily as needed for rhinitis or allergies.    Marland Kitchen losartan-hydrochlorothiazide (HYZAAR) 50-12.5 MG per tablet Take 1 tablet by mouth daily. 90 tablet 3   No current facility-administered medications on file prior to visit.    Past Medical History  Diagnosis Date  . Interstitial cystitis     presentation in 1997 w/ abdominal pain/distention, Dr. Lawrence Santiago, [facility]  . Colonic polyp 2011    Dr.John Amedeo Plenty  . BPH (benign prostatic hypertrophy)   .  Skin cancer     basal cell, Dr.Dan Ronnald Ramp  . Hyperlipidemia   . Fasting hyperglycemia   . Nephrolithiasis     x 2  . Hiatal hernia 2012    Past Surgical History  Procedure Laterality Date  . Cystoscopy      2004/2011; Hospitalized 1997 w/ I.C.  . Colonoscopy w/ polypectomy      and esophageal dilation 01/2008 by Dr.Hayes; bladder stones s/p urethral dilation 10/2007 by Dr. Amalia Hailey  . Tonsillectomy    . Septoplasty    . Laparoscopic cholecystectomy  07/14/11  . Upper gastrointestinal endoscopy  2012    Dr Teena Irani; hiatal hernia    Social History   Social History  . Marital Status: Married    Spouse Name: N/A  . Number of Children: N/A  . Years of Education: N/A   Social History Main Topics  . Smoking status: Former Smoker    Quit date: 08/04/1960  . Smokeless tobacco: Never Used     Comment: smoked 1958-1961, up to 2 cigars / day  . Alcohol Use: Yes     Comment:  occasional wine  . Drug Use: No  . Sexual Activity: Not Currently   Other Topics Concern  . None   Social History Narrative   Regular exercise- yes           Family History  Problem Relation Age of Onset  . Transient ischemic attack Father 53  .  Hypertension Father   . Coronary artery disease Father   . Stroke Mother 20  . Hypertension Brother   . Diabetes Neg Hx   . Cancer Neg Hx     Review of Systems  Constitutional: Negative for fever and chills.  Respiratory: Negative for cough, shortness of breath and wheezing.   Cardiovascular: Negative for chest pain, palpitations and leg swelling.  Gastrointestinal:       Rare GERD  Neurological: Positive for dizziness (occasional related to allergies). Negative for light-headedness and headaches.       Objective:   Filed Vitals:   12/20/15 0805  BP: 138/70  Pulse: 57  Temp: 97.7 F (36.5 C)  Resp: 16   Filed Weights   12/20/15 0805  Weight: 175 lb (79.379 kg)   Body mass index is 25.11 kg/(m^2).   Physical Exam Constitutional:  Appears well-developed and well-nourished. No distress.  Neck: Neck supple. No tracheal deviation present. No thyromegaly present.  No carotid bruit. No cervical adenopathy.   Cardiovascular: Normal rate, regular rhythm and normal heart sounds.   No murmur heard.  No edema Pulmonary/Chest: Effort normal and breath sounds normal. No respiratory distress. No wheezes.       Assessment & Plan:   See Problem List for Assessment and Plan of chronic medical problems.   Follow up annually for PE

## 2015-12-20 NOTE — Progress Notes (Signed)
Pre visit review using our clinic review tool, if applicable. No additional management support is needed unless otherwise documented below in the visit note. 

## 2015-12-20 NOTE — Assessment & Plan Note (Signed)
No symptoms  Will see urology only as needed

## 2016-04-05 ENCOUNTER — Encounter: Payer: Medicare Other | Admitting: Internal Medicine

## 2016-04-10 DIAGNOSIS — L821 Other seborrheic keratosis: Secondary | ICD-10-CM | POA: Diagnosis not present

## 2016-04-10 DIAGNOSIS — Z85828 Personal history of other malignant neoplasm of skin: Secondary | ICD-10-CM | POA: Diagnosis not present

## 2016-04-10 DIAGNOSIS — L570 Actinic keratosis: Secondary | ICD-10-CM | POA: Diagnosis not present

## 2016-04-10 DIAGNOSIS — D1801 Hemangioma of skin and subcutaneous tissue: Secondary | ICD-10-CM | POA: Diagnosis not present

## 2016-04-10 DIAGNOSIS — L304 Erythema intertrigo: Secondary | ICD-10-CM | POA: Diagnosis not present

## 2016-04-17 ENCOUNTER — Encounter: Payer: Medicare Other | Admitting: Internal Medicine

## 2016-04-23 ENCOUNTER — Encounter: Payer: Self-pay | Admitting: Internal Medicine

## 2016-04-23 ENCOUNTER — Other Ambulatory Visit (INDEPENDENT_AMBULATORY_CARE_PROVIDER_SITE_OTHER): Payer: Medicare Other

## 2016-04-23 ENCOUNTER — Ambulatory Visit (INDEPENDENT_AMBULATORY_CARE_PROVIDER_SITE_OTHER): Payer: Medicare Other | Admitting: Internal Medicine

## 2016-04-23 VITALS — BP 164/86 | HR 54 | Temp 97.6°F | Resp 16 | Ht 69.5 in | Wt 173.0 lb

## 2016-04-23 DIAGNOSIS — Z0000 Encounter for general adult medical examination without abnormal findings: Secondary | ICD-10-CM | POA: Diagnosis not present

## 2016-04-23 DIAGNOSIS — Z125 Encounter for screening for malignant neoplasm of prostate: Secondary | ICD-10-CM | POA: Diagnosis not present

## 2016-04-23 DIAGNOSIS — K449 Diaphragmatic hernia without obstruction or gangrene: Secondary | ICD-10-CM | POA: Insufficient documentation

## 2016-04-23 DIAGNOSIS — I10 Essential (primary) hypertension: Secondary | ICD-10-CM

## 2016-04-23 LAB — COMPREHENSIVE METABOLIC PANEL
ALBUMIN: 4.9 g/dL (ref 3.5–5.2)
ALT: 14 U/L (ref 0–53)
AST: 17 U/L (ref 0–37)
Alkaline Phosphatase: 58 U/L (ref 39–117)
BILIRUBIN TOTAL: 0.7 mg/dL (ref 0.2–1.2)
BUN: 16 mg/dL (ref 6–23)
CALCIUM: 10 mg/dL (ref 8.4–10.5)
CHLORIDE: 102 meq/L (ref 96–112)
CO2: 27 meq/L (ref 19–32)
Creatinine, Ser: 1 mg/dL (ref 0.40–1.50)
GFR: 76.42 mL/min (ref >60.00)
Glucose, Bld: 106 mg/dL — ABNORMAL HIGH (ref 70–99)
Potassium: 4.3 mEq/L (ref 3.5–5.1)
SODIUM: 140 meq/L (ref 135–145)
Total Protein: 7.1 g/dL (ref 6.0–8.3)

## 2016-04-23 LAB — CBC WITH DIFFERENTIAL/PLATELET
BASOS ABS: 0.1 10*3/uL (ref 0.0–0.1)
Basophils Relative: 1.1 % (ref 0.0–3.0)
EOS ABS: 0.7 10*3/uL (ref 0.0–0.7)
Eosinophils Relative: 7.1 % — ABNORMAL HIGH (ref 0.0–5.0)
HCT: 47.8 % (ref 39.0–52.0)
Hemoglobin: 16.1 g/dL (ref 13.0–17.0)
LYMPHS ABS: 3.5 10*3/uL (ref 0.7–4.0)
Lymphocytes Relative: 35.6 % (ref 12.0–46.0)
MCHC: 33.7 g/dL (ref 30.0–36.0)
MCV: 88.7 fl (ref 78.0–100.0)
Monocytes Absolute: 0.8 10*3/uL (ref 0.1–1.0)
Monocytes Relative: 8.3 % (ref 3.0–12.0)
NEUTROS ABS: 4.8 10*3/uL (ref 1.4–7.7)
NEUTROS PCT: 47.9 % (ref 43.0–77.0)
PLATELETS: 263 10*3/uL (ref 150.0–400.0)
RBC: 5.38 Mil/uL (ref 4.22–5.81)
RDW: 13.4 % (ref 11.5–15.5)
WBC: 9.9 10*3/uL (ref 4.0–10.5)

## 2016-04-23 LAB — HEMOGLOBIN A1C: HEMOGLOBIN A1C: 5.8 % (ref 4.6–6.5)

## 2016-04-23 LAB — LIPID PANEL
CHOLESTEROL: 167 mg/dL (ref 0–200)
HDL: 46.8 mg/dL (ref >39.00)
LDL CALC: 97 mg/dL (ref 0–99)
NonHDL: 120.66
TRIGLYCERIDES: 119 mg/dL (ref 0.0–149.0)
Total CHOL/HDL Ratio: 4
VLDL: 23.8 mg/dL (ref 0.0–40.0)

## 2016-04-23 LAB — PSA, MEDICARE: PSA: 1.45 ng/mL (ref 0.10–4.00)

## 2016-04-23 LAB — TSH: TSH: 1.17 u[IU]/mL (ref 0.35–4.50)

## 2016-04-23 MED ORDER — SCOPOLAMINE 1 MG/3DAYS TD PT72: 1.0000 | MEDICATED_PATCH | TRANSDERMAL | Status: DC

## 2016-04-23 NOTE — Progress Notes (Signed)
Subjective:    Patient ID: Mark Copeland, male    DOB: 1934-03-28, 82 y.o.   MRN: [id number]  HPI Here for medicare wellness exam/physical exam.   I have personally reviewed and have noted 1.The patient's medical and social history 2.Their use of alcohol, tobacco or illicit drugs 3.Their current medications and supplements 4.The patient's functional ability including ADL's, fall risks, home safety risks and                 hearing or visual impairment. 5.Diet and physical activities 6.Evidence for depression or mood disorders 7.Care team reviewed and updated - Derm - Dr Ronnald Ramp, Urology - Dr Amalia Hailey    Are there smokers in your home (other than you)? No  Risk Factors Exercise: walks daily, bikes every other day, upper light weights Dietary issues discussed: well balanced, high fiber and veges, occasional wine  Cardiac risk factors: advanced age, hypertension  Depression Screen  Have you felt down, depressed or hopeless? No  Have you felt little interest or pleasure in doing things?  No  Activities of Daily Living In your present state of health, do you have any difficulty performing the following activities?:  Driving? No Managing money?  No Feeding yourself? No Getting from bed to chair? No Climbing a flight of stairs? No Preparing food and eating?: No Bathing or showering? No Getting dressed: No Getting to/using the toilet? No Moving around from place to place: No In the past year have you fallen or had a near fall?: No   Hearing Difficulties: No Do you often ask people to speak up or repeat themselves? No Do you experience ringing or noises in your ears? occasionally - TMJ, clenching the jaw causing some ringing Do you have difficulty understanding soft or whispered voices? No Vision:              Any change in vision: no              Up to date with eye exam:  yes  Memory:  Do you feel that  you have a problem with memory? No - except recalling names  Do you often misplace items? No  Do you feel safe at home?  Yes  Cognitive Testing  Alert, Orientated? Yes  Normal Appearance? Yes  Recall of three objects?  Yes  Can perform simple calculations? Yes  Displays appropriate judgment? Yes  Can read the correct time from a watch face? Yes   Advanced Directives have been discussed with the patient? Yes  Medications and allergies reviewed with patient and updated if appropriate.  Patient Active Problem List   Diagnosis Date Noted  . Anxiety 03/03/2012  . Hypertension 11/13/2011  . Abnormal chest x-ray 11/13/2011  . NEPHROLITHIASIS, HX OF 10/14/2010  . Hyperglycemia 10/11/2010  . SKIN CANCER, HX OF 10/11/2010  . INTENTION TREMOR 09/15/2009  . BENIGN PROSTATIC HYPERTROPHY 09/15/2009  . History of colonic polyps 12/05/2008  . Hyperlipidemia 02/18/2008  . Esophageal reflux 02/18/2008  . INTERSTITIAL CYSTITIS 02/18/2008    Current Outpatient Prescriptions on File Prior to Visit  Medication Sig Dispense Refill  . amLODipine (NORVASC) 5 MG tablet Take 1 tablet (5 mg total) by mouth daily. 90 tablet 3  . fexofenadine (ALLEGRA) 180 MG tablet Take 180 mg by mouth daily as needed (allergies).    . fluticasone (FLONASE) 50 MCG/ACT nasal spray Place 2 sprays into the nose 2 (two) times daily as needed for rhinitis or allergies.    Marland Kitchen losartan-hydrochlorothiazide (  HYZAAR) 50-12.5 MG tablet Take 1 tablet by mouth daily. 90 tablet 3   No current facility-administered medications on file prior to visit.    Past Medical History  Diagnosis Date  . Interstitial cystitis     presentation in 1997 w/ abdominal pain/distention, Dr. Lawrence Santiago, [facility]  . Colonic polyp 2011    Dr.John Amedeo Plenty  . BPH (benign prostatic hypertrophy)   . Skin cancer     basal cell, Dr.Dan Ronnald Ramp  . Hyperlipidemia   . Fasting hyperglycemia   . Nephrolithiasis     x 2  . Hiatal hernia 2012    Past Surgical  History  Procedure Laterality Date  . Cystoscopy      2004/2011; Hospitalized 1997 w/ I.C.  . Colonoscopy w/ polypectomy      and esophageal dilation 01/2008 by Dr.Hayes; bladder stones s/p urethral dilation 10/2007 by Dr. Amalia Hailey  . Tonsillectomy    . Septoplasty    . Laparoscopic cholecystectomy  07/14/11  . Upper gastrointestinal endoscopy  2012    Dr Teena Irani; hiatal hernia    Social History   Social History  . Marital Status: Married    Spouse Name: N/A  . Number of Children: N/A  . Years of Education: N/A   Social History Main Topics  . Smoking status: Former Smoker    Quit date: 08/04/1960  . Smokeless tobacco: Never Used     Comment: smoked 1958-1961, up to 2 cigars / day  . Alcohol Use: Yes     Comment:  occasional wine  . Drug Use: No  . Sexual Activity: Not Currently   Other Topics Concern  . Not on file   Social History Narrative   Regular exercise- yes           Family History  Problem Relation Age of Onset  . Transient ischemic attack Father 37  . Hypertension Father   . Coronary artery disease Father   . Stroke Mother 83  . Hypertension Brother   . Diabetes Neg Hx   . Cancer Neg Hx     Review of Systems  Constitutional: Negative for fever, chills, appetite change, fatigue and unexpected weight change.  HENT: Positive for tinnitus (with TMJ and jaw clenching). Negative for hearing loss.   Eyes: Negative for visual disturbance.  Respiratory: Negative for cough, shortness of breath and wheezing.   Cardiovascular: Negative for chest pain, palpitations and leg swelling.  Gastrointestinal: Negative for nausea, abdominal pain, diarrhea, constipation and blood in stool.       Hiatal hernia discomfort or GERD at times  Genitourinary: Positive for frequency (related to increased water intake). Negative for dysuria, hematuria and difficulty urinating.  Musculoskeletal: Negative for back pain and arthralgias.  Skin: Negative for rash.  Neurological:  Negative for dizziness, light-headedness and headaches.  Psychiatric/Behavioral: Negative for dysphoric mood. The patient is not nervous/anxious.        Objective:   Filed Vitals:   04/23/16 0912 04/23/16 0928  BP: 152/90 164/86  Pulse: 54   Temp: 97.6 F (36.4 C)   Resp: 16    Filed Weights   04/23/16 0912  Weight: 173 lb (78.472 kg)   Body mass index is 25.19 kg/(m^2).   Physical Exam Constitutional: He appears well-developed and well-nourished. No distress.  HENT:  Head: Normocephalic and atraumatic.  Right Ear: External ear normal.  Left Ear: External ear normal.  Mouth/Throat: Oropharynx is clear and moist.  Normal ear canals and TM b/l  Eyes: Conjunctivae and  EOM are normal.  Neck: Neck supple. No tracheal deviation present. No thyromegaly present.  No carotid bruit  Cardiovascular: Normal rate, regular rhythm, normal heart sounds and intact distal pulses.   No murmur heard. Pulmonary/Chest: Effort normal and breath sounds normal. No respiratory distress. He has no wheezes. He has no rales.  Abdominal: Soft. Bowel sounds are normal. He exhibits no distension. There is no tenderness.  Genitourinary: deferred  Musculoskeletal: He exhibits no edema.  Lymphadenopathy:    He has no cervical adenopathy.  Skin: Skin is warm and dry. He is not diaphoretic.  Psychiatric: He has a normal mood and affect. His behavior is normal.          Assessment & Plan:   Wellness Exam: Immunizations  Up to date  Colonoscopy  No longer needed at this age, Which we discussed Eye exam - Up to date  Hearing loss - none Memory concerns/difficulties-no concerning memory difficulties Independent of ADLs-fully independent Continue regular exercise and healthy lifestyle   Patient received copy of preventative screening tests/immunizations recommended for the next 5-10 years.   Physical exam: Screening blood work ordered Immunizations  Up to date  Colonoscopy  No longer needed  at this age Eye exams-up-to-date EKG-last time 2014, normal, asymptomatic-no need to repeat Exercise - regular, will continue regular exercise. May consider stopping biking because he is concerned about falling, but overall his balance is good Weight-weight is very good for his age Skin -sees dermatology annually Substance abuse-no concern for abuse  Overall feels well. He is in excellent health and has a healthy lifestyle.  See Problem List for Assessment and Plan of chronic medical problems.  scopolamine prescribed for upcoming cruises  Follow-up annually

## 2016-04-23 NOTE — Assessment & Plan Note (Signed)
Whitecoat hypertension Well-controlled at home Continue to monitor closely home Continue current doses of current medication

## 2016-04-23 NOTE — Progress Notes (Signed)
Pre visit review using our clinic review tool, if applicable. No additional management support is needed unless otherwise documented below in the visit note. 

## 2016-04-23 NOTE — Patient Instructions (Addendum)
Mark Copeland , Thank you for taking time to come for your Medicare Wellness Visit. I appreciate your ongoing commitment to your health goals. Please review the following plan we discussed and let me know if I can assist you in the future.   These are the goals we discussed: Goals    None      This is a list of the screening recommended for you and due dates:  Health Maintenance  Topic Date Due  . Flu Shot  06/11/2016  . Tetanus Vaccine  10/11/2020  . Shingles Vaccine  Completed  . Pneumonia vaccines  Completed   Health Maintenance, Male A healthy lifestyle and preventative care can promote health and wellness.  Maintain regular health, dental, and eye exams.  Eat a healthy diet. Foods like vegetables, fruits, whole grains, low-fat dairy products, and lean protein foods contain the nutrients you need and are low in calories. Decrease your intake of foods high in solid fats, added sugars, and salt. Get information about a proper diet from your health care provider, if necessary.  Regular physical exercise is one of the most important things you can do for your health. Most adults should get at least 150 minutes of moderate-intensity exercise (any activity that increases your heart rate and causes you to sweat) each week. In addition, most adults need muscle-strengthening exercises on 2 or more days a week.   Maintain a healthy weight. The body mass index (BMI) is a screening tool to identify possible weight problems. It provides an estimate of body fat based on height and weight. Your health care provider can find your BMI and can help you achieve or maintain a healthy weight. For males 20 years and older:  A BMI below 18.5 is considered underweight.  A BMI of 18.5 to 24.9 is normal.  A BMI of 25 to 29.9 is considered overweight.  A BMI of 30 and above is considered obese.  Maintain normal blood lipids and cholesterol by exercising and minimizing your intake of saturated fat. Eat  a balanced diet with plenty of fruits and vegetables. Blood tests for lipids and cholesterol should begin at age 63 and be repeated every 5 years. If your lipid or cholesterol levels are high, you are over age 9, or you are at high risk for heart disease, you may need your cholesterol levels checked more frequently.Ongoing high lipid and cholesterol levels should be treated with medicines if diet and exercise are not working.  If you smoke, find out from your health care provider how to quit. If you do not use tobacco, do not start.  Lung cancer screening is recommended for adults aged 44-80 years who are at high risk for developing lung cancer because of a history of smoking. A yearly low-dose CT scan of the lungs is recommended for people who have at least a 30-pack-year history of smoking and are current smokers or have quit within the past 15 years. A pack year of smoking is smoking an average of 1 pack of cigarettes a day for 1 year (for example, a 30-pack-year history of smoking could mean smoking 1 pack a day for 30 years or 2 packs a day for 15 years). Yearly screening should continue until the smoker has stopped smoking for at least 15 years. Yearly screening should be stopped for people who develop a health problem that would prevent them from having lung cancer treatment.  If you choose to drink alcohol, do not have more than 2 drinks  per day. One drink is considered to be 12 oz (360 mL) of beer, 5 oz (150 mL) of wine, or 1.5 oz (45 mL) of liquor.  Avoid the use of street drugs. Do not share needles with anyone. Ask for help if you need support or instructions about stopping the use of drugs.  High blood pressure causes heart disease and increases the risk of stroke. High blood pressure is more likely to develop in:  People who have blood pressure in the end of the normal range (100-139/85-89 mm Hg).  People who are overweight or obese.  People who are African American.  If you are  73-40 years of age, have your blood pressure checked every 3-5 years. If you are 29 years of age or older, have your blood pressure checked every year. You should have your blood pressure measured twice--once when you are at a hospital or clinic, and once when you are not at a hospital or clinic. Record the average of the two measurements. To check your blood pressure when you are not at a hospital or clinic, you can use:  An automated blood pressure machine at a pharmacy.  A home blood pressure monitor.  If you are 22-20 years old, ask your health care provider if you should take aspirin to prevent heart disease.  Diabetes screening involves taking a blood sample to check your fasting blood sugar level. This should be done once every 3 years after age 29 if you are at a normal weight and without risk factors for diabetes. Testing should be considered at a younger age or be carried out more frequently if you are overweight and have at least 1 risk factor for diabetes.  Colorectal cancer can be detected and often prevented. Most routine colorectal cancer screening begins at the age of 55 and continues through age 34. However, your health care provider may recommend screening at an earlier age if you have risk factors for colon cancer. On a yearly basis, your health care provider may provide home test kits to check for hidden blood in the stool. A small camera at the end of a tube may be used to directly examine the colon (sigmoidoscopy or colonoscopy) to detect the earliest forms of colorectal cancer. Talk to your health care provider about this at age 11 when routine screening begins. A direct exam of the colon should be repeated every 5-10 years through age 3, unless early forms of precancerous polyps or small growths are found.  People who are at an increased risk for hepatitis B should be screened for this virus. You are considered at high risk for hepatitis B if:  You were born in a country where  hepatitis B occurs often. Talk with your health care provider about which countries are considered high risk.  Your parents were born in a high-risk country and you have not received a shot to protect against hepatitis B (hepatitis B vaccine).  You have HIV or AIDS.  You use needles to inject street drugs.  You live with, or have sex with, someone who has hepatitis B.  You are a man who has sex with other men (MSM).  You get hemodialysis treatment.  You take certain medicines for conditions like cancer, organ transplantation, and autoimmune conditions.  Hepatitis C blood testing is recommended for all people born from [birth year] through 1963 and any individual with known risk factors for hepatitis C.  Healthy men should no longer receive prostate-specific antigen (PSA) blood tests as  part of routine cancer screening. Talk to your health care provider about prostate cancer screening.  Testicular cancer screening is not recommended for adolescents or adult males who have no symptoms. Screening includes self-exam, a health care provider exam, and other screening tests. Consult with your health care provider about any symptoms you have or any concerns you have about testicular cancer.  Practice safe sex. Use condoms and avoid high-risk sexual practices to reduce the spread of sexually transmitted infections (STIs).  You should be screened for STIs, including gonorrhea and chlamydia if:  You are sexually active and are younger than 24 years.  You are older than 24 years, and your health care provider tells you that you are at risk for this type of infection.  Your sexual activity has changed since you were last screened, and you are at an increased risk for chlamydia or gonorrhea. Ask your health care provider if you are at risk.  If you are at risk of being infected with HIV, it is recommended that you take a prescription medicine daily to prevent HIV infection. This is called pre-exposure  prophylaxis (PrEP). You are considered at risk if:  You are a man who has sex with other men (MSM).  You are a heterosexual man who is sexually active with multiple partners.  You take drugs by injection.  You are sexually active with a partner who has HIV.  Talk with your health care provider about whether you are at high risk of being infected with HIV. If you choose to begin PrEP, you should first be tested for HIV. You should then be tested every 3 months for as long as you are taking PrEP.  Use sunscreen. Apply sunscreen liberally and repeatedly throughout the day. You should seek shade when your shadow is shorter than you. Protect yourself by wearing long sleeves, pants, a wide-brimmed hat, and sunglasses year round whenever you are outdoors.  Tell your health care provider of new moles or changes in moles, especially if there is a change in shape or color. Also, tell your health care provider if a mole is larger than the size of a pencil eraser.  A one-time screening for abdominal aortic aneurysm (AAA) and surgical repair of large AAAs by ultrasound is recommended for men aged 65-75 years who are current or former smokers.  Stay current with your vaccines (immunizations).   This information is not intended to replace advice given to you by your health care provider. Make sure you discuss any questions you have with your health care provider.   Document Released: 04/25/2008 Document Revised: 11/18/2014 Document Reviewed: 03/25/2011 Elsevier Interactive Patient Education [employer].

## 2016-07-19 DIAGNOSIS — H2513 Age-related nuclear cataract, bilateral: Secondary | ICD-10-CM | POA: Diagnosis not present

## 2016-07-19 DIAGNOSIS — H43813 Vitreous degeneration, bilateral: Secondary | ICD-10-CM | POA: Diagnosis not present

## 2016-08-08 ENCOUNTER — Ambulatory Visit (INDEPENDENT_AMBULATORY_CARE_PROVIDER_SITE_OTHER): Payer: Medicare Other

## 2016-08-08 DIAGNOSIS — Z23 Encounter for immunization: Secondary | ICD-10-CM | POA: Diagnosis not present

## 2016-11-22 ENCOUNTER — Other Ambulatory Visit: Payer: Self-pay | Admitting: Internal Medicine

## 2016-11-22 DIAGNOSIS — I10 Essential (primary) hypertension: Secondary | ICD-10-CM

## 2017-01-02 ENCOUNTER — Other Ambulatory Visit: Payer: Self-pay | Admitting: Internal Medicine

## 2017-01-02 DIAGNOSIS — I10 Essential (primary) hypertension: Secondary | ICD-10-CM

## 2017-01-30 DIAGNOSIS — N3010 Interstitial cystitis (chronic) without hematuria: Secondary | ICD-10-CM | POA: Diagnosis not present

## 2017-04-10 DIAGNOSIS — D485 Neoplasm of uncertain behavior of skin: Secondary | ICD-10-CM | POA: Diagnosis not present

## 2017-04-10 DIAGNOSIS — L570 Actinic keratosis: Secondary | ICD-10-CM | POA: Diagnosis not present

## 2017-04-10 DIAGNOSIS — D225 Melanocytic nevi of trunk: Secondary | ICD-10-CM | POA: Diagnosis not present

## 2017-04-10 DIAGNOSIS — Z85828 Personal history of other malignant neoplasm of skin: Secondary | ICD-10-CM | POA: Diagnosis not present

## 2017-04-10 DIAGNOSIS — L821 Other seborrheic keratosis: Secondary | ICD-10-CM | POA: Diagnosis not present

## 2017-04-10 DIAGNOSIS — C4441 Basal cell carcinoma of skin of scalp and neck: Secondary | ICD-10-CM | POA: Diagnosis not present

## 2017-04-24 ENCOUNTER — Encounter: Payer: Self-pay | Admitting: Internal Medicine

## 2017-04-24 NOTE — Progress Notes (Signed)
Subjective:    Patient ID: Mark Copeland, male    DOB: 14-Nov-1933, 83 y.o.   MRN: [id number]  HPI Here for medicare wellness exam and annual physical exam   I have personally reviewed and have noted 1.The patient's medical and social history 2.Their use of alcohol, tobacco or illicit drugs 3.Their current medications and supplements 4.The patient's functional ability including ADL's, fall risks, home safety risks and  hearing or visual impairment. 5.Diet and physical activities 6.Evidence for depression or mood disorders 7.Care team reviewed  - Derm - Dr Ronnald Ramp, Urology - Dr Amalia Hailey  He takes flonase and takes allegra.  He has plugged ears and an unbalanced feeling at times.  The flonase helps prevent the sneezing.  He can pop his ears.  The flonase makes him have nose bleeds.    He is under increased stress related to his granddaughter.   He checks his BP at home regularly prior to coming here and it is well controlled - 120-130's/70's.     Are there smokers in your home (other than you)? No  Risk Factors Exercise: walking, biking, fishing Dietary issues discussed: well balanced - lots of veges and fruits, Avoids fried food, too many sweets  Cardiac risk factors: advanced age, hypertension  Depression Screen  Have you felt down, depressed or hopeless? No  Have you felt little interest or pleasure in doing things?  No  Activities of Daily Living In your present state of health, do you have any difficulty performing the following activities?:  Driving? No Managing money?  No Feeding yourself? No Getting from bed to chair? No Climbing a flight of stairs? No Preparing food and eating?: No Bathing or showering? No Getting dressed: No Getting to/using the toilet? No Moving around from place to place: No In the past year have you fallen or had a near fall?: No   Are you sexually active?  yes  Do you have  more than one partner?  no  Hearing Difficulties: No Do you often ask people to speak up or repeat themselves? No Do you experience ringing or noises in your ears? yes Do you have difficulty understanding soft or whispered voices? No Vision:              Any change in vision:  no             Up to date with eye exam:   yes Memory:  Do you feel that you have a problem with memory? No, has difficulty with names  Do you often misplace items? No  Do you feel safe at home?  Yes  Cognitive Testing  Alert, Orientated? Yes  Normal Appearance? Yes  Recall of three objects?  Yes  Can perform simple calculations? Yes  Displays appropriate judgment? Yes  Can read the correct time from a watch face? Yes   Advanced Directives have been discussed with the patient? Yes   Medications and allergies reviewed with patient and updated if appropriate.  Patient Active Problem List   Diagnosis Date Noted  . TMJ arthralgia 04/25/2017  . Hiatal hernia, small 04/23/2016  . ED (erectile dysfunction) of organic origin 05/06/2012  . Anxiety 03/03/2012  . Hypertension 11/13/2011  . Abnormal chest x-ray 11/13/2011  . NEPHROLITHIASIS, HX OF 10/14/2010  . Hyperglycemia 10/11/2010  . SKIN CANCER, HX OF 10/11/2010  . INTENTION TREMOR 09/15/2009  . BENIGN PROSTATIC HYPERTROPHY 09/15/2009  . History of colonic polyps 12/05/2008  . Hyperlipidemia 02/18/2008  . Esophageal  reflux 02/18/2008  . INTERSTITIAL CYSTITIS 02/18/2008    Current Outpatient Prescriptions on File Prior to Visit  Medication Sig Dispense Refill  . amLODipine (NORVASC) 5 MG tablet TAKE 1 TABLET BY MOUTH  DAILY 90 tablet 1  . fexofenadine (ALLEGRA) 180 MG tablet Take 180 mg by mouth daily as needed (allergies).    . fluticasone (FLONASE) 50 MCG/ACT nasal spray Place 2 sprays into the nose 2 (two) times daily as needed for rhinitis or allergies.    Marland Kitchen losartan-hydrochlorothiazide (HYZAAR) 50-12.5 MG tablet TAKE 1 TABLET BY MOUTH  DAILY 90  tablet 1  . scopolamine (TRANSDERM-SCOP, 1.5 MG,) 1 MG/3DAYS Place 1 patch (1.5 mg total) onto the skin every 3 (three) days. 10 patch 5   No current facility-administered medications on file prior to visit.     Past Medical History:  Diagnosis Date  . BPH (benign prostatic hypertrophy)   . Colonic polyp 2011   Dr.John Amedeo Plenty  . Fasting hyperglycemia   . Hiatal hernia 2012  . Hyperlipidemia   . Interstitial cystitis    presentation in 1997 w/ abdominal pain/distention, Dr. Lawrence Santiago, [facility]  . Nephrolithiasis    x 2  . Skin cancer    basal cell, Dr.Dan Ronnald Ramp    Past Surgical History:  Procedure Laterality Date  . COLONOSCOPY W/ POLYPECTOMY     and esophageal dilation 01/2008 by Dr.Hayes; bladder stones s/p urethral dilation 10/2007 by Dr. Amalia Hailey  . CYSTOSCOPY     2004/2011; Hospitalized 1997 w/ I.C.  . LAPAROSCOPIC CHOLECYSTECTOMY  07/14/11  . SEPTOPLASTY    . TONSILLECTOMY    . UPPER GASTROINTESTINAL ENDOSCOPY  2012   Dr Teena Irani; hiatal hernia    Social History   Social History  . Marital status: Married    Spouse name: N/A  . Number of children: N/A  . Years of education: N/A   Social History Main Topics  . Smoking status: Former Smoker    Quit date: 08/04/1960  . Smokeless tobacco: Never Used     Comment: smoked 1958-1961, up to 2 cigars / day  . Alcohol use Yes     Comment:  occasional wine  . Drug use: No  . Sexual activity: Not Currently   Other Topics Concern  . None   Social History Narrative   Regular exercise- yes           Family History  Problem Relation Age of Onset  . Transient ischemic attack Father 48  . Hypertension Father   . Coronary artery disease Father   . Stroke Mother 85  . Hypertension Brother   . Diabetes Neg Hx   . Cancer Neg Hx     Review of Systems  Constitutional: Negative for chills and fever.  HENT: Positive for congestion (very mild), sinus pressure (mild) and tinnitus. Negative for ear pain, hearing loss and sinus  pain.        Ear popping/plugged  Eyes: Negative for visual disturbance.  Respiratory: Negative for cough, shortness of breath and wheezing.   Cardiovascular: Negative for chest pain, palpitations and leg swelling.  Gastrointestinal: Negative for abdominal pain, blood in stool, constipation, diarrhea and nausea.       No gerd  Genitourinary: Negative for difficulty urinating, dysuria and hematuria.  Musculoskeletal: Negative for arthralgias and back pain.  Skin: Negative for color change.  Neurological: Positive for dizziness. Negative for light-headedness and headaches.  Psychiatric/Behavioral: Positive for dysphoric mood (mild, controlled). The patient is nervous/anxious (mild, controlled).  Objective:   Vitals:   04/25/17 0804  BP: (!) 180/82  Pulse: (!) 56  Resp: 16  Temp: 97.5 F (36.4 C)   Filed Weights   04/25/17 0804  Weight: 173 lb (78.5 kg)   Body mass index is 24.82 kg/m.  Wt Readings from Last 3 Encounters:  04/25/17 173 lb (78.5 kg)  04/23/16 173 lb (78.5 kg)  12/20/15 175 lb (79.4 kg)     Physical Exam Constitutional: He appears well-developed and well-nourished. No distress.  HENT:  Head: Normocephalic and atraumatic.  Right Ear: External ear normal.  Left Ear: External ear normal.  Mouth/Throat: Oropharynx is clear and moist.  Normal ear canals and TM b/l  Eyes: Conjunctivae and EOM are normal.  Neck: Neck supple. No tracheal deviation present. No thyromegaly present.  No carotid bruit  Cardiovascular: Normal rate, regular rhythm, normal heart sounds and intact distal pulses.   No murmur heard. Pulmonary/Chest: Effort normal and breath sounds normal. No respiratory distress. He has no wheezes. He has no rales.  Abdominal: Soft. Bowel sounds are normal. He exhibits no distension. There is no tenderness.  Genitourinary: deferred  Musculoskeletal: He exhibits no edema.  Lymphadenopathy:    He has no cervical adenopathy.  Skin: Skin is warm  and dry. He is not diaphoretic.  Psychiatric: He has a normal mood and affect. His behavior is normal.       Assessment & Plan:   Wellness Exam: Immunizations  Up to date, discussed shingrix Colonoscopy  - no longer needed due to age Eye exam Up to date  Hearing loss    none Memory concerns/difficulties  None - except for names Independent of ADLs   fully Stressed the importance of regular exercise   Patient received copy of preventative screening tests/immunizations recommended for the next 5-10 years.   Physical exam: Screening blood work ordered Immunizations  Up to date, discussed shingrix Colonoscopy  - no longer needed due to age Eye exams  Up to date   EKG  Last done 2014 Exercise  Regular walking and biking Weight  Good for age Skin  - sees derm, no concerns Substance abuse  none  See Problem List for Assessment and Plan of chronic medical problems.   Follow-up annually

## 2017-04-24 NOTE — Patient Instructions (Addendum)
Mark Copeland , Thank you for taking time to come for your Medicare Wellness Visit. I appreciate your ongoing commitment to your health goals. Please review the following plan we discussed and let me know if I can assist you in the future.   These are the goals we discussed: Goals    None      This is a list of the screening recommended for you and due dates:  Health Maintenance  Topic Date Due  . Flu Shot  06/11/2017  . Tetanus Vaccine  10/11/2020  . Pneumonia vaccines  Completed    Consider getting the new shingles vaccines.  The prescription was sent to [facility].    Test(s) ordered today. Your results will be released to [place] (or called to you) after review, usually within 72hours after test completion. If any changes need to be made, you will be notified at that same time.  All other Health Maintenance issues reviewed.   All recommended immunizations and age-appropriate screenings are up-to-date or discussed.  No immunizations administered today.   Medications reviewed and updated.  No changes recommended at this time.  Your prescription(s) have been submitted to your pharmacy. Please take as directed and contact our office if you believe you are having problem(s) with the medication(s).  Please followup in one year    Health Maintenance, Male A healthy lifestyle and preventive care is important for your health and wellness. Ask your health care provider about what schedule of regular examinations is right for you. What should I know about weight and diet? Eat a Healthy Diet  Eat plenty of vegetables, fruits, whole grains, low-fat dairy products, and lean protein.  Do not eat a lot of foods high in solid fats, added sugars, or salt.  Maintain a Healthy Weight Regular exercise can help you achieve or maintain a healthy weight. You should:  Do at least 150 minutes of exercise each week. The exercise should increase your heart rate and make you sweat (moderate-intensity  exercise).  Do strength-training exercises at least twice a week.  Watch Your Levels of Cholesterol and Blood Lipids  Have your blood tested for lipids and cholesterol every 5 years starting at 81 years of age. If you are at high risk for heart disease, you should start having your blood tested when you are 81 years old. You may need to have your cholesterol levels checked more often if: ? Your lipid or cholesterol levels are high. ? You are older than 81 years of age. ? You are at high risk for heart disease.  What should I know about cancer screening? Many types of cancers can be detected early and may often be prevented. Lung Cancer  You should be screened every year for lung cancer if: ? You are a current smoker who has smoked for at least 30 years. ? You are a former smoker who has quit within the past 15 years.  Talk to your health care provider about your screening options, when you should start screening, and how often you should be screened.  Colorectal Cancer  Routine colorectal cancer screening usually begins at 81 years of age and should be repeated every 5-10 years until you are 81 years old. You may need to be screened more often if early forms of precancerous polyps or small growths are found. Your health care provider may recommend screening at an earlier age if you have risk factors for colon cancer.  Your health care provider may recommend using home test  kits to check for hidden blood in the stool.  A small camera at the end of a tube can be used to examine your colon (sigmoidoscopy or colonoscopy). This checks for the earliest forms of colorectal cancer.  Prostate and Testicular Cancer  Depending on your age and overall health, your health care provider may do certain tests to screen for prostate and testicular cancer.  Talk to your health care provider about any symptoms or concerns you have about testicular or prostate cancer.  Skin Cancer  Check your skin  from head to toe regularly.  Tell your health care provider about any new moles or changes in moles, especially if: ? There is a change in a mole's size, shape, or color. ? You have a mole that is larger than a pencil eraser.  Always use sunscreen. Apply sunscreen liberally and repeat throughout the day.  Protect yourself by wearing long sleeves, pants, a wide-brimmed hat, and sunglasses when outside.  What should I know about heart disease, diabetes, and high blood pressure?  If you are 30-9 years of age, have your blood pressure checked every 3-5 years. If you are 23 years of age or older, have your blood pressure checked every year. You should have your blood pressure measured twice-once when you are at a hospital or clinic, and once when you are not at a hospital or clinic. Record the average of the two measurements. To check your blood pressure when you are not at a hospital or clinic, you can use: ? An automated blood pressure machine at a pharmacy. ? A home blood pressure monitor.  Talk to your health care provider about your target blood pressure.  If you are between 64-48 years old, ask your health care provider if you should take aspirin to prevent heart disease.  Have regular diabetes screenings by checking your fasting blood sugar level. ? If you are at a normal weight and have a low risk for diabetes, have this test once every three years after the age of 23. ? If you are overweight and have a high risk for diabetes, consider being tested at a younger age or more often.  A one-time screening for abdominal aortic aneurysm (AAA) by ultrasound is recommended for men aged 47-75 years who are current or former smokers. What should I know about preventing infection? Hepatitis B If you have a higher risk for hepatitis B, you should be screened for this virus. Talk with your health care provider to find out if you are at risk for hepatitis B infection. Hepatitis C Blood testing is  recommended for:  Everyone born from [birth year] through 1963.  Anyone with known risk factors for hepatitis C.  Sexually Transmitted Diseases (STDs)  You should be screened each year for STDs including gonorrhea and chlamydia if: ? You are sexually active and are younger than 81 years of age. ? You are older than 81 years of age and your health care provider tells you that you are at risk for this type of infection. ? Your sexual activity has changed since you were last screened and you are at an increased risk for chlamydia or gonorrhea. Ask your health care provider if you are at risk.  Talk with your health care provider about whether you are at high risk of being infected with HIV. Your health care provider may recommend a prescription medicine to help prevent HIV infection.  What else can I do?  Schedule regular health, dental, and eye exams.  Stay current with your vaccines (immunizations).  Do not use any tobacco products, such as cigarettes, chewing tobacco, and e-cigarettes. If you need help quitting, ask your health care provider.  Limit alcohol intake to no more than 2 drinks per day. One drink equals 12 ounces of beer, 5 ounces of wine, or 1 ounces of hard liquor.  Do not use street drugs.  Do not share needles.  Ask your health care provider for help if you need support or information about quitting drugs.  Tell your health care provider if you often feel depressed.  Tell your health care provider if you have ever been abused or do not feel safe at home. This information is not intended to replace advice given to you by your health care provider. Make sure you discuss any questions you have with your health care provider. Document Released: 04/25/2008 Document Revised: 06/26/2016 Document Reviewed: 08/01/2015 Elsevier Interactive Patient Education  [employer].

## 2017-04-25 ENCOUNTER — Ambulatory Visit (INDEPENDENT_AMBULATORY_CARE_PROVIDER_SITE_OTHER): Payer: Medicare Other | Admitting: Internal Medicine

## 2017-04-25 ENCOUNTER — Encounter: Payer: Self-pay | Admitting: Internal Medicine

## 2017-04-25 ENCOUNTER — Other Ambulatory Visit (INDEPENDENT_AMBULATORY_CARE_PROVIDER_SITE_OTHER): Payer: Medicare Other

## 2017-04-25 VITALS — BP 180/82 | HR 56 | Temp 97.5°F | Resp 16 | Ht 70.0 in | Wt 173.0 lb

## 2017-04-25 DIAGNOSIS — Z0000 Encounter for general adult medical examination without abnormal findings: Secondary | ICD-10-CM | POA: Diagnosis not present

## 2017-04-25 DIAGNOSIS — I10 Essential (primary) hypertension: Secondary | ICD-10-CM | POA: Diagnosis not present

## 2017-04-25 DIAGNOSIS — R739 Hyperglycemia, unspecified: Secondary | ICD-10-CM

## 2017-04-25 DIAGNOSIS — M26629 Arthralgia of temporomandibular joint, unspecified side: Secondary | ICD-10-CM | POA: Insufficient documentation

## 2017-04-25 DIAGNOSIS — Z125 Encounter for screening for malignant neoplasm of prostate: Secondary | ICD-10-CM | POA: Diagnosis not present

## 2017-04-25 DIAGNOSIS — E7800 Pure hypercholesterolemia, unspecified: Secondary | ICD-10-CM | POA: Diagnosis not present

## 2017-04-25 DIAGNOSIS — N400 Enlarged prostate without lower urinary tract symptoms: Secondary | ICD-10-CM | POA: Diagnosis not present

## 2017-04-25 DIAGNOSIS — N529 Male erectile dysfunction, unspecified: Secondary | ICD-10-CM

## 2017-04-25 DIAGNOSIS — M26623 Arthralgia of bilateral temporomandibular joint: Secondary | ICD-10-CM | POA: Insufficient documentation

## 2017-04-25 LAB — LIPID PANEL
CHOLESTEROL: 161 mg/dL (ref 0–200)
HDL: 50.3 mg/dL (ref >39.00)
LDL CALC: 93 mg/dL (ref 0–99)
NonHDL: 110.31
TRIGLYCERIDES: 89 mg/dL (ref 0.0–149.0)
Total CHOL/HDL Ratio: 3
VLDL: 17.8 mg/dL (ref 0.0–40.0)

## 2017-04-25 LAB — COMPREHENSIVE METABOLIC PANEL
ALBUMIN: 4.8 g/dL (ref 3.5–5.2)
ALT: 13 U/L (ref 0–53)
AST: 16 U/L (ref 0–37)
Alkaline Phosphatase: 70 U/L (ref 39–117)
BUN: 18 mg/dL (ref 6–23)
CALCIUM: 10.4 mg/dL (ref 8.4–10.5)
CHLORIDE: 101 meq/L (ref 96–112)
CO2: 29 mEq/L (ref 19–32)
CREATININE: 1.05 mg/dL (ref 0.40–1.50)
GFR: 72.06 mL/min (ref >60.00)
Glucose, Bld: 111 mg/dL — ABNORMAL HIGH (ref 70–99)
Potassium: 4.8 mEq/L (ref 3.5–5.1)
SODIUM: 139 meq/L (ref 135–145)
Total Bilirubin: 0.9 mg/dL (ref 0.2–1.2)
Total Protein: 6.9 g/dL (ref 6.0–8.3)

## 2017-04-25 LAB — HEMOGLOBIN A1C: Hgb A1c MFr Bld: 5.9 % (ref 4.6–6.5)

## 2017-04-25 LAB — CBC WITH DIFFERENTIAL/PLATELET
BASOS PCT: 1 % (ref 0.0–3.0)
Basophils Absolute: 0.1 10*3/uL (ref 0.0–0.1)
EOS ABS: 0.6 10*3/uL (ref 0.0–0.7)
Eosinophils Relative: 7.9 % — ABNORMAL HIGH (ref 0.0–5.0)
HCT: 49.3 % (ref 39.0–52.0)
HEMOGLOBIN: 16.5 g/dL (ref 13.0–17.0)
LYMPHS ABS: 2.6 10*3/uL (ref 0.7–4.0)
Lymphocytes Relative: 32.4 % (ref 12.0–46.0)
MCHC: 33.6 g/dL (ref 30.0–36.0)
MCV: 90.2 fl (ref 78.0–100.0)
MONO ABS: 0.7 10*3/uL (ref 0.1–1.0)
Monocytes Relative: 8.4 % (ref 3.0–12.0)
NEUTROS ABS: 4.1 10*3/uL (ref 1.4–7.7)
Neutrophils Relative %: 50.3 % (ref 43.0–77.0)
PLATELETS: 262 10*3/uL (ref 150.0–400.0)
RBC: 5.46 Mil/uL (ref 4.22–5.81)
RDW: 13.2 % (ref 11.5–15.5)
WBC: 8.2 10*3/uL (ref 4.0–10.5)

## 2017-04-25 LAB — PSA, MEDICARE: PSA: 1.97 ng/ml (ref 0.10–4.00)

## 2017-04-25 LAB — TSH: TSH: 1.09 u[IU]/mL (ref 0.35–4.50)

## 2017-04-25 MED ORDER — AMLODIPINE BESYLATE 5 MG PO TABS: 5.0000 mg | ORAL_TABLET | Freq: Every day | ORAL | 3 refills | Status: DC

## 2017-04-25 MED ORDER — ZOSTER VAC RECOMB ADJUVANTED 50 MCG/0.5ML IM SUSR: 0.5000 mL | Freq: Once | INTRAMUSCULAR | 1 refills | Status: AC

## 2017-04-25 MED ORDER — LOSARTAN POTASSIUM-HCTZ 50-12.5 MG PO TABS: 1.0000 | ORAL_TABLET | Freq: Every day | ORAL | 3 refills | Status: DC

## 2017-04-25 NOTE — Assessment & Plan Note (Signed)
White coat htn, well controlled at home BP well controlled Current regimen effective and well tolerated Continue current medications at current doses

## 2017-04-25 NOTE — Assessment & Plan Note (Signed)
Has been seen urology-we will follow-up as needed With like testosterone checked

## 2017-04-25 NOTE — Assessment & Plan Note (Signed)
Was following with urology regularly, but they have decided that he will see them as needed only No voiding difficulty, asymptomatic

## 2017-04-25 NOTE — Assessment & Plan Note (Signed)
Check A1c. 

## 2017-04-25 NOTE — Assessment & Plan Note (Signed)
Check lipid panel, TSH

## 2017-04-26 ENCOUNTER — Encounter: Payer: Self-pay | Admitting: Internal Medicine

## 2017-04-28 ENCOUNTER — Other Ambulatory Visit: Payer: Self-pay | Admitting: Emergency Medicine

## 2017-04-28 ENCOUNTER — Other Ambulatory Visit: Payer: Self-pay | Admitting: Internal Medicine

## 2017-04-28 ENCOUNTER — Other Ambulatory Visit: Payer: Medicare Other

## 2017-04-28 DIAGNOSIS — N529 Male erectile dysfunction, unspecified: Secondary | ICD-10-CM

## 2017-05-01 LAB — SPECIMEN STATUS REPORT

## 2017-05-01 LAB — TESTOSTERONE,FREE AND TOTAL
TESTOSTERONE FREE: 4.9 pg/mL — AB (ref 6.6–18.1)
TESTOSTERONE: 213 ng/dL — AB (ref 264–916)

## 2017-05-02 ENCOUNTER — Encounter: Payer: Self-pay | Admitting: Internal Medicine

## 2017-05-03 ENCOUNTER — Encounter: Payer: Self-pay | Admitting: Internal Medicine

## 2017-05-06 ENCOUNTER — Encounter: Payer: Self-pay | Admitting: Internal Medicine

## 2017-05-06 DIAGNOSIS — N3010 Interstitial cystitis (chronic) without hematuria: Secondary | ICD-10-CM | POA: Diagnosis not present

## 2017-05-06 DIAGNOSIS — N320 Bladder-neck obstruction: Secondary | ICD-10-CM | POA: Diagnosis not present

## 2017-05-06 DIAGNOSIS — E291 Testicular hypofunction: Secondary | ICD-10-CM | POA: Diagnosis not present

## 2017-05-15 NOTE — Telephone Encounter (Signed)
Faxed labs to Dr. Amalia Hailey @ [phone] per MD request...../lmb

## 2017-05-29 ENCOUNTER — Encounter: Payer: Self-pay | Admitting: Internal Medicine

## 2017-06-16 NOTE — Progress Notes (Signed)
[facility] [address] [place], [place] [address] Phone: [phone] Subjective:    I'm seeing this patient by the request  of:  Mark Rail, MD   CC: Numbness in the hands and fingers  IOX:[id number]  Mark Copeland is a 83 y.o. male coming in with complaint of numbness in the hands. This is only on the left side actually. Patient did have blood drawn done on June 15. Since then unfortunately continues to have pain. Had pain during that time as well. Had significant swelling in the antecubital fossa. Patient states that unfortunately he is now having more of a chronic numbness noted in the hand. In addition of this patient is also having weakness. Patient states that daily activities are becoming more difficult. Patient states even at night the numbness can give him a real irritation. Patient is concerned because everything seems to be worsening.     Past Medical History:  Diagnosis Date  . BPH (benign prostatic hypertrophy)   . Colonic polyp 2011   Dr.John Amedeo Plenty  . Fasting hyperglycemia   . Hiatal hernia 2012  . Hyperlipidemia   . Interstitial cystitis    presentation in 1997 w/ abdominal pain/distention, Dr. Lawrence Santiago, [facility]  . Nephrolithiasis    x 2  . Skin cancer    basal cell, Dr.Dan Ronnald Ramp   Past Surgical History:  Procedure Laterality Date  . COLONOSCOPY W/ POLYPECTOMY     and esophageal dilation 01/2008 by Dr.Hayes; bladder stones s/p urethral dilation 10/2007 by Dr. Amalia Hailey  . CYSTOSCOPY     2004/2011; Hospitalized 1997 w/ I.C.  . LAPAROSCOPIC CHOLECYSTECTOMY  07/14/11  . SEPTOPLASTY    . TONSILLECTOMY    . UPPER GASTROINTESTINAL ENDOSCOPY  2012   Dr Teena Irani; hiatal hernia   Social History   Social History  . Marital status: Married    Spouse name: N/A  . Number of children: N/A  . Years of education: N/A   Social History Main Topics  . Smoking status: Former Smoker    Quit date: 08/04/1960  . Smokeless tobacco: Never  Used     Comment: smoked 1958-1961, up to 2 cigars / day  . Alcohol use Yes     Comment:  occasional wine  . Drug use: No  . Sexual activity: Not Currently   Other Topics Concern  . Not on file   Social History Narrative   Regular exercise- yes          Allergies  Allergen Reactions  . Penicillins     Hives Because of a history of documented adverse serious drug reaction;Medi Alert bracelet  is recommended  . Sulfonamide Derivatives     Scrotal exfoliative rash Because of a history of documented adverse serious drug reaction;Medi Alert bracelet  is recommended   . Amlodipine     See 08/10/2013  . Shellfish Allergy Diarrhea and Nausea And Vomiting    Gi problems   . Tamsulosin     REACTION: ? caused palpitations   Family History  Problem Relation Age of Onset  . Transient ischemic attack Father 69  . Hypertension Father   . Coronary artery disease Father   . Stroke Mother 110  . Hypertension Brother   . Diabetes Neg Hx   . Cancer Neg Hx      Past medical history, social, surgical and family history all reviewed in electronic medical record.  No pertanent information unless stated regarding to the chief complaint.  Review of Systems:Review of systems updated and as accurate as of 06/16/17  No headache, visual changes, nausea, vomiting, diarrhea, constipation, dizziness, abdominal pain, skin rash, fevers, chills, night sweats, weight loss, swollen lymph nodes, body aches, joint swelling, muscle aches, chest pain, shortness of breath, mood changes.   Objective  There were no vitals taken for this visit. Systems examined below as of 06/16/17   General: No apparent distress alert and oriented x3 mood and affect normal, dressed appropriately.  HEENT: Pupils equal, extraocular movements intact  Respiratory: Patient's speak in full sentences and does not appear short of breath  Cardiovascular: No lower extremity edema, non tender, no erythema  Skin: Warm dry intact with  no signs of infection or rash on extremities or on axial skeleton.  Abdomen: Soft nontender  Neuro: Cranial nerves II through XII are intact, neurovascularly intact in all extremities with 2+ DTRs and 2+ pulses.  Lymph: No lymphadenopathy of posterior or anterior cervical chain or axillae bilaterally.  Gait normal with good balance and coordination.  MSK:  Non tender with full range of motion and good stability and symmetric strength and tone of shoulders, , hip, knee and ankles bilaterally.  Neck: Inspection unremarkable. No palpable stepoffs. Negative Spurling's maneuver. Full neck range of motion Grip strength and sensation normal in bilateral hands Weakness noted in the C6 distribution Numbness in the C6 distribution Negative Hoffman sign bilaterally Reflexes normal Patient does have pain with full extension of the elbow. Patient also has some pain with full supination.  Wrist exam shows the patient has a negative Tinel's. Patient though does have some thenar eminence wasting on the left side compared to contralateral sign. Weakness noted with 4 out of 5 strength. Good capillary refill noted.   Impression and Recommendations:     This case required medical decision making of moderate complexity.      Note: This dictation was prepared with Dragon dictation along with smaller phrase technology. Any transcriptional errors that result from this process are unintentional.

## 2017-06-17 ENCOUNTER — Encounter: Payer: Self-pay | Admitting: Family Medicine

## 2017-06-17 ENCOUNTER — Ambulatory Visit (INDEPENDENT_AMBULATORY_CARE_PROVIDER_SITE_OTHER): Payer: Medicare Other | Admitting: Family Medicine

## 2017-06-17 DIAGNOSIS — S5412XA Injury of median nerve at forearm level, left arm, initial encounter: Secondary | ICD-10-CM | POA: Diagnosis not present

## 2017-06-17 DIAGNOSIS — S5410XA Injury of median nerve at forearm level, unspecified arm, initial encounter: Secondary | ICD-10-CM | POA: Insufficient documentation

## 2017-06-17 MED ORDER — PREDNISONE 20 MG PO TABS: 40.0000 mg | ORAL_TABLET | Freq: Every day | ORAL | 0 refills | Status: DC

## 2017-06-17 MED ORDER — GABAPENTIN 100 MG PO CAPS: 200.0000 mg | ORAL_CAPSULE | Freq: Every day | ORAL | 3 refills | Status: DC

## 2017-06-17 NOTE — Patient Instructions (Addendum)
Good to see you  I do think the nerve is injured and we will need to watch it  Gabapentin 200mg  at night Prednisone daily for 5 days.  Over the counter get  Vitamin D 2000 IU daily  B12 1051mcg daily  B6 200mg  daily  See me again in 2 weeks (OK to double book)

## 2017-06-17 NOTE — Assessment & Plan Note (Signed)
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 Some mild fullness but I do not think that this is a long-term. Patient given medications including prednisone and gabapentin. We discussed icing regimen. Discussed monitoring for other signs and symptoms such as increasing weakness. Patient does have some thenar eminence wasting at this time. Patient given gabapentin. Depending on how patient response we will discuss with patient about the possibility of further imaging such as an MR angiogram as well as a potential EMG. Follow-up again in 2 weeks

## 2017-07-09 ENCOUNTER — Ambulatory Visit (INDEPENDENT_AMBULATORY_CARE_PROVIDER_SITE_OTHER): Payer: Medicare Other | Admitting: Internal Medicine

## 2017-07-09 ENCOUNTER — Encounter: Payer: Self-pay | Admitting: Internal Medicine

## 2017-07-09 VITALS — BP 158/60 | HR 86 | Temp 98.0°F | Resp 16 | Wt 174.0 lb

## 2017-07-09 DIAGNOSIS — M5410 Radiculopathy, site unspecified: Secondary | ICD-10-CM | POA: Insufficient documentation

## 2017-07-09 DIAGNOSIS — Z23 Encounter for immunization: Secondary | ICD-10-CM | POA: Diagnosis not present

## 2017-07-09 MED ORDER — PREDNISONE 20 MG PO TABS: 40.0000 mg | ORAL_TABLET | Freq: Every day | ORAL | 0 refills | Status: AC

## 2017-07-09 NOTE — Progress Notes (Signed)
Subjective:    Patient ID: Mark Copeland, male    DOB: [birth year], 83 y.o.   MRN: [id number]  HPI He is here for an acute visit.   Right hip pain:   It started two weeeks ago after he tri[place] and fell [place] the track. He fell on his right buttock.  He got up and went home and put ice on his right buttock.  There was no bruisng. He started taking advil.   He has radiating pain in his right anterior thigh and groin and this is where all of his pain is.  He has minimal pain in the right buttock region and that is very deep pain.  He applied a heating pad to the anterior upper right leg and it made the leg stiff and it felt worse.  Advil helps.  He denies pain in the lower back or posterior right upper leg.  If he bends over he feels the right upper anterior and posterior leg stretch.  He continues to walk and wonders if that is ok. The leg catches at times.  He denies numbness/tingling in the leg.     He leaves to go out of the country in one week.   Medications and allergies reviewed with patient and updated if appropriate.  Patient Active Problem List   Diagnosis Date Noted  . 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 TMJ arthralgia 04/25/2017  . Hiatal hernia, small 04/23/2016  . Erectile dysfunction 05/06/2012  . Anxiety 03/03/2012  . Hypertension 11/13/2011  . Abnormal chest x-ray 11/13/2011  . NEPHROLITHIASIS, HX OF 10/14/2010  . Hyperglycemia 10/11/2010  . SKIN CANCER, HX OF 10/11/2010  . INTENTION TREMOR 09/15/2009  . BPH (benign prostatic hyperplasia) 09/15/2009  . History of colonic polyps 12/05/2008  . Hyperlipidemia 02/18/2008  . Esophageal reflux 02/18/2008  . INTERSTITIAL CYSTITIS 02/18/2008    Current Outpatient Prescriptions on File Prior to Visit  Medication Sig Dispense Refill  . amLODipine (NORVASC) 5 MG tablet Take 1 tablet (5 mg total) by mouth daily. 90 tablet 3  . fexofenadine (ALLEGRA) 180 MG tablet Take 180 mg by mouth daily as needed (allergies).    .  fluticasone (FLONASE) 50 MCG/ACT nasal spray Place 2 sprays into the nose 2 (two) times daily as needed for rhinitis or allergies.    Marland Kitchen gabapentin (NEURONTIN) 100 MG capsule Take 2 capsules (200 mg total) by mouth at bedtime. 60 capsule 3  . losartan-hydrochlorothiazide (HYZAAR) 50-12.5 MG tablet Take 1 tablet by mouth daily. 90 tablet 3  . predniSONE (DELTASONE) 20 MG tablet Take 2 tablets (40 mg total) by mouth daily with breakfast. 10 tablet 0   No current facility-administered medications on file prior to visit.     Past Medical History:  Diagnosis Date  . BPH (benign prostatic hypertrophy)   . Colonic polyp 2011   Dr.John Amedeo Plenty  . Fasting hyperglycemia   . Hiatal hernia 2012  . Hyperlipidemia   . Interstitial cystitis    presentation in 1997 w/ abdominal pain/distention, Dr. Lawrence Santiago, [facility]  . Nephrolithiasis    x 2  . Skin cancer    basal cell, Dr.Dan Ronnald Ramp    Past Surgical History:  Procedure Laterality Date  . COLONOSCOPY W/ POLYPECTOMY     and esophageal dilation 01/2008 by Dr.Hayes; bladder stones s/p urethral dilation 10/2007 by Dr. Amalia Hailey  . CYSTOSCOPY     2004/2011; Hospitalized 1997 w/ [facility]  . LAPAROSCOPIC CHOLECYSTECTOMY  07/14/11  . SEPTOPLASTY    .  TONSILLECTOMY    . UPPER GASTROINTESTINAL ENDOSCOPY  2012   Dr Teena Irani; hiatal hernia    Social History   Social History  . Marital status: Married    Spouse name: N/A  . Number of children: N/A  . Years of education: N/A   Social History Main Topics  . Smoking status: Former Smoker    Quit date: 08/04/1960  . Smokeless tobacco: Never Used     Comment: smoked 1958-1961, up to 2 cigars / day  . Alcohol use Yes     Comment:  occasional wine  . Drug use: No  . Sexual activity: Not Currently   Other Topics Concern  . None   Social History Narrative   Regular exercise- yes           Family History  Problem Relation Age of Onset  . Transient ischemic attack Father 76  . Hypertension Father   .  Coronary artery disease Father   . Stroke Mother 72  . Hypertension Brother   . Diabetes Neg Hx   . Cancer Neg Hx     Review of Systems  Constitutional: Negative for chills and fever.  Musculoskeletal: Positive for myalgias. Negative for back pain.       Mild right buttock pain, right anterior upper leg pain  Neurological: Negative for weakness and numbness.       Objective:   Vitals:   07/09/17 1049  BP: (!) 158/60  Pulse: 86  Resp: 16  Temp: 98 F (36.7 C)  SpO2: 97%   Filed Weights   07/09/17 1049  Weight: 174 lb (78.9 kg)   Body mass index is 24.97 kg/m.  Wt Readings from Last 3 Encounters:  07/09/17 174 lb (78.9 kg)  06/17/17 176 lb (79.8 kg)  04/25/17 173 lb (78.5 kg)     Physical Exam  Constitutional: He appears well-developed and well-nourished. No distress.  HENT:  Head: Normocephalic and atraumatic.  Musculoskeletal: He exhibits no edema.  No lower back pain or deformity. No pain with palpation of right upper leg anteriorly or posteriorly; normal ROM of leg  Neurological: He exhibits normal muscle tone.  Normal sensation and strength in b/l LE, gait is normal.   Skin: He is not diaphoretic.          Assessment & Plan:   See Problem List for Assessment and Plan of chronic medical problems.

## 2017-07-09 NOTE — Patient Instructions (Signed)
Take the steroid for 5 days and then restart the advil.  The prednisone was sent to your pharmacy.    You can take gabapentin for the pain as well.   Call if no improvement

## 2017-07-09 NOTE — Assessment & Plan Note (Signed)
Related to fall onto right buttock region - causing pinched nerve with pain to anterior right upper leg No N/ T or weakness Some improvement but slow Hold advil Start prednisone x 5 days then can resume advil -- take with food and monitor for GI upset Avoid heat Continue to walk Can try gabapentin  - he still has some at home  Call if no improvement

## 2017-07-24 DIAGNOSIS — H353131 Nonexudative age-related macular degeneration, bilateral, early dry stage: Secondary | ICD-10-CM | POA: Diagnosis not present

## 2017-07-24 DIAGNOSIS — H43813 Vitreous degeneration, bilateral: Secondary | ICD-10-CM | POA: Diagnosis not present

## 2017-07-24 DIAGNOSIS — H2513 Age-related nuclear cataract, bilateral: Secondary | ICD-10-CM | POA: Diagnosis not present

## 2017-07-31 DIAGNOSIS — N390 Urinary tract infection, site not specified: Secondary | ICD-10-CM | POA: Diagnosis not present

## 2017-07-31 DIAGNOSIS — R300 Dysuria: Secondary | ICD-10-CM | POA: Diagnosis not present

## 2017-08-04 ENCOUNTER — Encounter (INDEPENDENT_AMBULATORY_CARE_PROVIDER_SITE_OTHER): Payer: Medicare Other | Admitting: Ophthalmology

## 2017-08-04 DIAGNOSIS — H2513 Age-related nuclear cataract, bilateral: Secondary | ICD-10-CM

## 2017-08-04 DIAGNOSIS — I10 Essential (primary) hypertension: Secondary | ICD-10-CM

## 2017-08-04 DIAGNOSIS — H35033 Hypertensive retinopathy, bilateral: Secondary | ICD-10-CM | POA: Diagnosis not present

## 2017-08-04 DIAGNOSIS — H43813 Vitreous degeneration, bilateral: Secondary | ICD-10-CM

## 2017-08-04 DIAGNOSIS — H353132 Nonexudative age-related macular degeneration, bilateral, intermediate dry stage: Secondary | ICD-10-CM | POA: Diagnosis not present

## 2017-08-07 DIAGNOSIS — Z87440 Personal history of urinary (tract) infections: Secondary | ICD-10-CM | POA: Diagnosis not present

## 2017-08-07 DIAGNOSIS — N3000 Acute cystitis without hematuria: Secondary | ICD-10-CM | POA: Diagnosis not present

## 2017-08-07 DIAGNOSIS — E291 Testicular hypofunction: Secondary | ICD-10-CM | POA: Diagnosis not present

## 2017-08-07 DIAGNOSIS — N3010 Interstitial cystitis (chronic) without hematuria: Secondary | ICD-10-CM | POA: Diagnosis not present

## 2017-09-02 ENCOUNTER — Encounter (INDEPENDENT_AMBULATORY_CARE_PROVIDER_SITE_OTHER): Payer: Medicare Other | Admitting: Ophthalmology

## 2017-09-02 DIAGNOSIS — I10 Essential (primary) hypertension: Secondary | ICD-10-CM | POA: Diagnosis not present

## 2017-09-02 DIAGNOSIS — H2513 Age-related nuclear cataract, bilateral: Secondary | ICD-10-CM

## 2017-09-02 DIAGNOSIS — H353132 Nonexudative age-related macular degeneration, bilateral, intermediate dry stage: Secondary | ICD-10-CM

## 2017-09-02 DIAGNOSIS — H35033 Hypertensive retinopathy, bilateral: Secondary | ICD-10-CM

## 2017-09-02 DIAGNOSIS — H43813 Vitreous degeneration, bilateral: Secondary | ICD-10-CM

## 2017-12-10 NOTE — Progress Notes (Signed)
Subjective:    Patient ID: Mark Copeland, male    DOB: 07/08/1934, 84 y.o.   MRN: [id number]  HPI He is here for an acute visit for cold symptoms.  His symptoms started one week ago.  He just returned home from traveling.  He is experiencing ear pressure, nasal congestion with yellow discolored mucus, chest tightness, wheezing and a productive cough with yellow phlegm.  He denies fever, sinus pain or pressure, sore throat, shortness of breath, diarrhea, body aches and headaches.   He has tried taking mucinex, drinking lot of fluids and advil   Medications and allergies reviewed with patient and updated if appropriate.  Patient Active Problem List   Diagnosis Date Noted  . Radiculopathy of leg 07/09/2017  . 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 TMJ arthralgia 04/25/2017  . Hiatal hernia, small 04/23/2016  . Erectile dysfunction 05/06/2012  . Anxiety 03/03/2012  . Hypertension 11/13/2011  . Abnormal chest x-ray 11/13/2011  . NEPHROLITHIASIS, HX OF 10/14/2010  . Hyperglycemia 10/11/2010  . SKIN CANCER, HX OF 10/11/2010  . INTENTION TREMOR 09/15/2009  . BPH (benign prostatic hyperplasia) 09/15/2009  . History of colonic polyps 12/05/2008  . Hyperlipidemia 02/18/2008  . Esophageal reflux 02/18/2008  . INTERSTITIAL CYSTITIS 02/18/2008    Current Outpatient Medications on File Prior to Visit  Medication Sig Dispense Refill  . amLODipine (NORVASC) 5 MG tablet Take 1 tablet (5 mg total) by mouth daily. 90 tablet 3  . fexofenadine (ALLEGRA) 180 MG tablet Take 180 mg by mouth daily as needed (allergies).    . fluticasone (FLONASE) 50 MCG/ACT nasal spray Place 2 sprays into the nose 2 (two) times daily as needed for rhinitis or allergies.    Marland Kitchen gabapentin (NEURONTIN) 100 MG capsule Take 2 capsules (200 mg total) by mouth at bedtime. 60 capsule 3  . losartan-hydrochlorothiazide (HYZAAR) 50-12.5 MG tablet Take 1 tablet by mouth daily. 90 tablet 3   No current facility-administered  medications on file prior to visit.     Past Medical History:  Diagnosis Date  . BPH (benign prostatic hypertrophy)   . Colonic polyp 2011   Dr.John Amedeo Plenty  . Fasting hyperglycemia   . Hiatal hernia 2012  . Hyperlipidemia   . Interstitial cystitis    presentation in 1997 w/ abdominal pain/distention, Dr. Lawrence Santiago, [facility]  . Nephrolithiasis    x 2  . Skin cancer    basal cell, Dr.Dan Ronnald Ramp    Past Surgical History:  Procedure Laterality Date  . COLONOSCOPY W/ POLYPECTOMY     and esophageal dilation 01/2008 by Dr.Hayes; bladder stones s/p urethral dilation 10/2007 by Dr. Amalia Hailey  . CYSTOSCOPY     2004/2011; Hospitalized 1997 w/ [facility]  . LAPAROSCOPIC CHOLECYSTECTOMY  07/14/11  . SEPTOPLASTY    . TONSILLECTOMY    . UPPER GASTROINTESTINAL ENDOSCOPY  2012   Dr Teena Irani; hiatal hernia    Social History   Socioeconomic History  . Marital status: Married    Spouse name: None  . Number of children: None  . Years of education: None  . Highest education level: None  Social Needs  . Financial resource strain: None  . Food insecurity - worry: None  . Food insecurity - inability: None  . Transportation needs - medical: None  . Transportation needs - non-medical: None  Occupational History  . None  Tobacco Use  . Smoking status: Former Smoker    Last attempt to quit: 08/04/1960    Years since quitting: 57.3  .  Smokeless tobacco: Never Used  . Tobacco comment: smoked 1958-1961, up to 2 cigars / day  Substance and Sexual Activity  . Alcohol use: Yes    Comment:  occasional wine  . Drug use: No  . Sexual activity: Not Currently  Other Topics Concern  . None  Social History Narrative   Regular exercise- yes           Family History  Problem Relation Age of Onset  . Transient ischemic attack Father 16  . Hypertension Father   . Coronary artery disease Father   . Stroke Mother 66  . Hypertension Brother   . Diabetes Neg Hx   . Cancer Neg Hx     Review of Systems    Constitutional: Negative for fever.  HENT: Positive for congestion (yellow mucus) and ear pain. Negative for sinus pressure, sinus pain and sore throat (resolved).   Respiratory: Positive for cough (productive of yellow sputum), chest tightness and wheezing. Negative for shortness of breath.   Gastrointestinal: Negative for diarrhea and nausea.  Musculoskeletal: Negative for myalgias.  Neurological: Negative for light-headedness and headaches.       Objective:   Vitals:   12/11/17 0931  BP: 138/84  Pulse: 66  Resp: 16  Temp: 98.1 F (36.7 C)  SpO2: 94%   Filed Weights   12/11/17 0931  Weight: 179 lb (81.2 kg)   Body mass index is 25.68 kg/m.  Wt Readings from Last 3 Encounters:  12/11/17 179 lb (81.2 kg)  07/09/17 174 lb (78.9 kg)  06/17/17 176 lb (79.8 kg)     Physical Exam GENERAL APPEARANCE: Appears stated age, well appearing, NAD EYES: conjunctiva clear, no icterus HEENT: bilateral tympanic membranes and ear canals normal, oropharynx with mild erythema, no thyromegaly, trachea midline, no cervical or supraclavicular lymphadenopathy LUNGS: Unlabored breathing, good air entry bilaterally, diffuse expiratory wheeze right side> left side, no crackles CARDIOVASCULAR: Normal S1,S2 without murmurs, no edema SKIN: warm, dry        Assessment & Plan:   See Problem List for Assessment and Plan of chronic medical problems.

## 2017-12-11 ENCOUNTER — Other Ambulatory Visit: Payer: Medicare Other

## 2017-12-11 ENCOUNTER — Ambulatory Visit (INDEPENDENT_AMBULATORY_CARE_PROVIDER_SITE_OTHER)
Admission: RE | Admit: 2017-12-11 | Discharge: 2017-12-11 | Disposition: A | Discharge status: Home / Self Care | Payer: Medicare Other | Source: Ambulatory Visit | Attending: Internal Medicine | Admitting: Internal Medicine

## 2017-12-11 ENCOUNTER — Ambulatory Visit (INDEPENDENT_AMBULATORY_CARE_PROVIDER_SITE_OTHER): Payer: Medicare Other | Admitting: Internal Medicine

## 2017-12-11 ENCOUNTER — Encounter: Payer: Self-pay | Admitting: Internal Medicine

## 2017-12-11 VITALS — BP 138/84 | HR 66 | Temp 98.1°F | Resp 16 | Wt 179.0 lb

## 2017-12-11 DIAGNOSIS — R079 Chest pain, unspecified: Secondary | ICD-10-CM | POA: Diagnosis not present

## 2017-12-11 DIAGNOSIS — R059 Cough, unspecified: Secondary | ICD-10-CM

## 2017-12-11 DIAGNOSIS — R05 Cough: Secondary | ICD-10-CM

## 2017-12-11 DIAGNOSIS — R062 Wheezing: Secondary | ICD-10-CM | POA: Diagnosis not present

## 2017-12-11 DIAGNOSIS — J209 Acute bronchitis, unspecified: Secondary | ICD-10-CM

## 2017-12-11 MED ORDER — AZITHROMYCIN 250 MG PO TABS: ORAL_TABLET | ORAL | 0 refills | Status: DC

## 2017-12-11 NOTE — Assessment & Plan Note (Signed)
Likely bronchitis Will check chest x-ray to rule out pneumonia Treatment as above

## 2017-12-11 NOTE — Assessment & Plan Note (Signed)
Diffuse wheezing on exam, right side more than left side We will check chest x-ray to rule out pneumonia Possibly reactive in nature If antibiotic does not help wheezing and chest tightness may need short course of steroids He will call if there is no improvement

## 2017-12-11 NOTE — Patient Instructions (Signed)
Take the antibiotic as prescribed.  Continue mucinex, increased fluids, advil.   Start using the flonase for your ears.   Have a chest xray today.  We will call you with the results.    Call if no improvement

## 2017-12-11 NOTE — Assessment & Plan Note (Signed)
Symptoms and exam consistent with acute bacterial bronchitis We will check chest x-ray given his age and wheezing on exam to rule out pneumonia Start azithromycin Continue Mucinex, start Flonase Continue increased rest and fluids Call if no improvement

## 2017-12-26 ENCOUNTER — Encounter: Payer: Self-pay | Admitting: Internal Medicine

## 2017-12-27 ENCOUNTER — Encounter: Payer: Self-pay | Admitting: Internal Medicine

## 2018-01-06 ENCOUNTER — Encounter (INDEPENDENT_AMBULATORY_CARE_PROVIDER_SITE_OTHER): Payer: Medicare Other | Admitting: Ophthalmology

## 2018-01-06 DIAGNOSIS — I10 Essential (primary) hypertension: Secondary | ICD-10-CM

## 2018-01-06 DIAGNOSIS — H2513 Age-related nuclear cataract, bilateral: Secondary | ICD-10-CM

## 2018-01-06 DIAGNOSIS — H35033 Hypertensive retinopathy, bilateral: Secondary | ICD-10-CM | POA: Diagnosis not present

## 2018-01-06 DIAGNOSIS — H43813 Vitreous degeneration, bilateral: Secondary | ICD-10-CM | POA: Diagnosis not present

## 2018-01-06 DIAGNOSIS — H353132 Nonexudative age-related macular degeneration, bilateral, intermediate dry stage: Secondary | ICD-10-CM | POA: Diagnosis not present

## 2018-01-30 ENCOUNTER — Other Ambulatory Visit: Payer: Self-pay | Admitting: Internal Medicine

## 2018-01-30 DIAGNOSIS — I10 Essential (primary) hypertension: Secondary | ICD-10-CM

## 2018-02-18 ENCOUNTER — Telehealth: Payer: Self-pay | Admitting: Emergency Medicine

## 2018-02-18 NOTE — Telephone Encounter (Signed)
Called patient to schedule AWV. Spoke to patients wife, declined at this time.

## 2018-02-20 DIAGNOSIS — Z23 Encounter for immunization: Secondary | ICD-10-CM | POA: Diagnosis not present

## 2018-04-10 ENCOUNTER — Other Ambulatory Visit: Payer: Self-pay | Admitting: Internal Medicine

## 2018-04-10 DIAGNOSIS — I10 Essential (primary) hypertension: Secondary | ICD-10-CM

## 2018-04-13 DIAGNOSIS — C4441 Basal cell carcinoma of skin of scalp and neck: Secondary | ICD-10-CM | POA: Diagnosis not present

## 2018-04-13 DIAGNOSIS — D1801 Hemangioma of skin and subcutaneous tissue: Secondary | ICD-10-CM | POA: Diagnosis not present

## 2018-04-13 DIAGNOSIS — Z85828 Personal history of other malignant neoplasm of skin: Secondary | ICD-10-CM | POA: Diagnosis not present

## 2018-04-13 DIAGNOSIS — L570 Actinic keratosis: Secondary | ICD-10-CM | POA: Diagnosis not present

## 2018-04-13 DIAGNOSIS — L821 Other seborrheic keratosis: Secondary | ICD-10-CM | POA: Diagnosis not present

## 2018-04-25 NOTE — Progress Notes (Signed)
Subjective:    Patient ID: Mark Copeland, male    DOB: January 01, 1934, 84 y.o.   MRN: [id number]  HPI He is here for a physical exam.   BP has been controlled at home.  He checks it regularly.   Medications and allergies reviewed with patient and updated if appropriate.  Patient Active Problem List   Diagnosis Date Noted  . Wheeze 12/11/2017  . Cough 12/11/2017  . Radiculopathy of leg 07/09/2017  . 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 TMJ arthralgia 04/25/2017  . Hiatal hernia, small 04/23/2016  . Erectile dysfunction 05/06/2012  . Bladder outlet obstruction 05/06/2012  . Anxiety 03/03/2012  . Hypertension 11/13/2011  . Abnormal chest x-ray 11/13/2011  . NEPHROLITHIASIS, HX OF 10/14/2010  . Prediabetes 10/11/2010  . SKIN CANCER, HX OF 10/11/2010  . INTENTION TREMOR 09/15/2009  . BPH (benign prostatic hyperplasia) 09/15/2009  . History of colonic polyps 12/05/2008  . Hyperlipidemia 02/18/2008  . Esophageal reflux 02/18/2008  . INTERSTITIAL CYSTITIS 02/18/2008    Current Outpatient Medications on File Prior to Visit  Medication Sig Dispense Refill  . amLODipine (NORVASC) 5 MG tablet Take 1 tablet (5 mg total) by mouth daily. 90 tablet 3  . fexofenadine (ALLEGRA) 180 MG tablet Take 180 mg by mouth daily as needed (allergies).    . fluticasone (FLONASE) 50 MCG/ACT nasal spray Place 2 sprays into the nose 2 (two) times daily as needed for rhinitis or allergies.    Marland Kitchen losartan-hydrochlorothiazide (HYZAAR) 50-12.5 MG tablet TAKE 1 TABLET BY MOUTH  DAILY 90 tablet 0  . pentosan polysulfate (ELMIRON) 100 MG capsule Take by mouth.    . sildenafil (REVATIO) 20 MG tablet 2-5 as needed for erections     No current facility-administered medications on file prior to visit.     Past Medical History:  Diagnosis Date  . BPH (benign prostatic hypertrophy)   . Colonic polyp 2011   Dr.John Amedeo Plenty  . Fasting hyperglycemia   . Hiatal hernia 2012  . Hyperlipidemia   . Interstitial  cystitis    presentation in 1997 w/ abdominal pain/distention, Dr. Lawrence Santiago, [facility]  . Nephrolithiasis    x 2  . Skin cancer    basal cell, Dr.Dan Ronnald Ramp    Past Surgical History:  Procedure Laterality Date  . COLONOSCOPY W/ POLYPECTOMY     and esophageal dilation 01/2008 by Dr.Hayes; bladder stones s/p urethral dilation 10/2007 by Dr. Amalia Hailey  . CYSTOSCOPY     2004/2011; Hospitalized 1997 w/ I[facility]  . LAPAROSCOPIC CHOLECYSTECTOMY  07/14/11  . SEPTOPLASTY    . TONSILLECTOMY    . UPPER GASTROINTESTINAL ENDOSCOPY  2012   Dr Teena Irani; hiatal hernia    Social History   Socioeconomic History  . Marital status: Married    Spouse name: Not on file  . Number of children: Not on file  . Years of education: Not on file  . Highest education level: Not on file  Occupational History  . Not on file  Social Needs  . Financial resource strain: Not on file  . Food insecurity:    Worry: Not on file    Inability: Not on file  . Transportation needs:    Medical: Not on file    Non-medical: Not on file  Tobacco Use  . Smoking status: Former Smoker    Last attempt to quit: 08/04/1960    Years since quitting: 57.7  . Smokeless tobacco: Never Used  . Tobacco comment: smoked 1958-1961, up to 2  cigars / day  Substance and Sexual Activity  . Alcohol use: Yes    Comment:  occasional wine  . Drug use: No  . Sexual activity: Not Currently  Lifestyle  . Physical activity:    Days per week: Not on file    Minutes per session: Not on file  . Stress: Not on file  Relationships  . Social connections:    Talks on phone: Not on file    Gets together: Not on file    Attends religious service: Not on file    Active member of club or organization: Not on file    Attends meetings of clubs or organizations: Not on file    Relationship status: Not on file  Other Topics Concern  . Not on file  Social History Narrative   Regular exercise- yes           Family History  Problem Relation Age of Onset    . Transient ischemic attack Father 57  . Hypertension Father   . Coronary artery disease Father   . Stroke Mother 41  . Hypertension Brother   . Diabetes Neg Hx   . Cancer Neg Hx     Review of Systems  Constitutional: Negative for chills, fatigue and fever.  Eyes: Negative for visual disturbance.  Respiratory: Negative for cough, shortness of breath and wheezing.   Cardiovascular: Negative for chest pain, palpitations and leg swelling.  Gastrointestinal: Negative for abdominal pain, blood in stool, constipation, diarrhea and nausea.       GERD rarely  Genitourinary: Negative for dysuria and hematuria.  Musculoskeletal: Negative for arthralgias and back pain.  Skin: Negative for color change and rash.  Neurological: Negative for light-headedness and headaches.  Psychiatric/Behavioral: Negative for dysphoric mood. The patient is not nervous/anxious.        Objective:   Vitals:   04/27/18 0800  BP: (!) 168/84  Pulse: (!) 56  Resp: 16  Temp: 97.8 F (36.6 C)  SpO2: 97%   Filed Weights   04/27/18 0800  Weight: 171 lb (77.6 kg)   Body mass index is 24.54 kg/m.  BP Readings from Last 3 Encounters:  04/27/18 (!) 168/84  12/11/17 138/84  07/09/17 (!) 158/60    Wt Readings from Last 3 Encounters:  04/27/18 171 lb (77.6 kg)  12/11/17 179 lb (81.2 kg)  07/09/17 174 lb (78.9 kg)     Physical Exam Constitutional: He appears well-developed and well-nourished. No distress.  HENT:  Head: Normocephalic and atraumatic.  Right Ear: External ear normal.  Left Ear: External ear normal.  Mouth/Throat: Oropharynx is clear and moist.  Normal ear canals and TM b/l  Eyes: Conjunctivae and EOM are normal.  Neck: Neck supple. No tracheal deviation present. No thyromegaly present.  No carotid bruit  Cardiovascular: Normal rate, regular rhythm, normal heart sounds and intact distal pulses.   1/6 systolic murmur heard. Pulmonary/Chest: Effort normal and breath sounds normal. No  respiratory distress. He has no wheezes. He has no rales.  Abdominal: Soft. He exhibits no distension. There is no tenderness.  Genitourinary: deferred  Musculoskeletal: He exhibits no edema.  Lymphadenopathy:   He has no cervical adenopathy.  Skin: Skin is warm and dry. He is not diaphoretic.  Psychiatric: He has a normal mood and affect. His behavior is normal.         Assessment & Plan:   Physical exam: Screening blood work ordered Immunizations  Had first shingrix, others up to date Colonoscopy  No  longer needed due to age Eye exams  Up to date  EKG  Done 04/2013 Exercise   Biking, walking Weight  Normal  weight Skin  Sees dr Ronnald Ramp regularly Substance abuse  none  See Problem List for Assessment and Plan of chronic medical problems.    FU in one year

## 2018-04-25 NOTE — Patient Instructions (Addendum)
Test(s) ordered today. Your results will be released to MyChart (or called to you) after review, usually within 72hours after test completion. If any changes need to be made, you will be notified at that same time.  All other Health Maintenance issues reviewed.   All recommended immunizations and age-appropriate screenings are up-to-date or discussed.  No immunizations administered today.   Medications reviewed and updated.  No changes recommended at this time.   Please followup in one year    Health Maintenance, Male A healthy lifestyle and preventive care is important for your health and wellness. Ask your health care provider about what schedule of regular examinations is right for you. What should I know about weight and diet? Eat a Healthy Diet  Eat plenty of vegetables, fruits, whole grains, low-fat dairy products, and lean protein.  Do not eat a lot of foods high in solid fats, added sugars, or salt.  Maintain a Healthy Weight Regular exercise can help you achieve or maintain a healthy weight. You should:  Do at least 150 minutes of exercise each week. The exercise should increase your heart rate and make you sweat (moderate-intensity exercise).  Do strength-training exercises at least twice a week.  Watch Your Levels of Cholesterol and Blood Lipids  Have your blood tested for lipids and cholesterol every 5 years starting at 82 years of age. If you are at high risk for heart disease, you should start having your blood tested when you are 82 years old. You may need to have your cholesterol levels checked more often if: ? Your lipid or cholesterol levels are high. ? You are older than 82 years of age. ? You are at high risk for heart disease.  What should I know about cancer screening? Many types of cancers can be detected early and may often be prevented. Lung Cancer  You should be screened every year for lung cancer if: ? You are a current smoker who has smoked for  at least 30 years. ? You are a former smoker who has quit within the past 15 years.  Talk to your health care provider about your screening options, when you should start screening, and how often you should be screened.  Colorectal Cancer  Routine colorectal cancer screening usually begins at 82 years of age and should be repeated every 5-10 years until you are 82 years old. You may need to be screened more often if early forms of precancerous polyps or small growths are found. Your health care provider may recommend screening at an earlier age if you have risk factors for colon cancer.  Your health care provider may recommend using home test kits to check for hidden blood in the stool.  A small camera at the end of a tube can be used to examine your colon (sigmoidoscopy or colonoscopy). This checks for the earliest forms of colorectal cancer.  Prostate and Testicular Cancer  Depending on your age and overall health, your health care provider may do certain tests to screen for prostate and testicular cancer.  Talk to your health care provider about any symptoms or concerns you have about testicular or prostate cancer.  Skin Cancer  Check your skin from head to toe regularly.  Tell your health care provider about any new moles or changes in moles, especially if: ? There is a change in a mole's size, shape, or color. ? You have a mole that is larger than a pencil eraser.  Always use sunscreen. Apply sunscreen liberally   and repeat throughout the day.  Protect yourself by wearing long sleeves, pants, a wide-brimmed hat, and sunglasses when outside.  What should I know about heart disease, diabetes, and high blood pressure?  If you are 18-39 years of age, have your blood pressure checked every 3-5 years. If you are 40 years of age or older, have your blood pressure checked every year. You should have your blood pressure measured twice-once when you are at a hospital or clinic, and once  when you are not at a hospital or clinic. Record the average of the two measurements. To check your blood pressure when you are not at a hospital or clinic, you can use: ? An automated blood pressure machine at a pharmacy. ? A home blood pressure monitor.  Talk to your health care provider about your target blood pressure.  If you are between 45-79 years old, ask your health care provider if you should take aspirin to prevent heart disease.  Have regular diabetes screenings by checking your fasting blood sugar level. ? If you are at a normal weight and have a low risk for diabetes, have this test once every three years after the age of 45. ? If you are overweight and have a high risk for diabetes, consider being tested at a younger age or more often.  A one-time screening for abdominal aortic aneurysm (AAA) by ultrasound is recommended for men aged 65-75 years who are current or former smokers. What should I know about preventing infection? Hepatitis B If you have a higher risk for hepatitis B, you should be screened for this virus. Talk with your health care provider to find out if you are at risk for hepatitis B infection. Hepatitis C Blood testing is recommended for:  Everyone born from 1945 through 1965.  Anyone with known risk factors for hepatitis C.  Sexually Transmitted Diseases (STDs)  You should be screened each year for STDs including gonorrhea and chlamydia if: ? You are sexually active and are younger than 82 years of age. ? You are older than 82 years of age and your health care provider tells you that you are at risk for this type of infection. ? Your sexual activity has changed since you were last screened and you are at an increased risk for chlamydia or gonorrhea. Ask your health care provider if you are at risk.  Talk with your health care provider about whether you are at high risk of being infected with HIV. Your health care provider may recommend a prescription  medicine to help prevent HIV infection.  What else can I do?  Schedule regular health, dental, and eye exams.  Stay current with your vaccines (immunizations).  Do not use any tobacco products, such as cigarettes, chewing tobacco, and e-cigarettes. If you need help quitting, ask your health care provider.  Limit alcohol intake to no more than 2 drinks per day. One drink equals 12 ounces of beer, 5 ounces of wine, or 1 ounces of hard liquor.  Do not use street drugs.  Do not share needles.  Ask your health care provider for help if you need support or information about quitting drugs.  Tell your health care provider if you often feel depressed.  Tell your health care provider if you have ever been abused or do not feel safe at home. This information is not intended to replace advice given to you by your health care provider. Make sure you discuss any questions you have with your health   care provider. Document Released: 04/25/2008 Document Revised: 06/26/2016 Document Reviewed: 08/01/2015 Elsevier Interactive Patient Education  2018 [employer]  

## 2018-04-27 ENCOUNTER — Other Ambulatory Visit (INDEPENDENT_AMBULATORY_CARE_PROVIDER_SITE_OTHER): Payer: Medicare Other

## 2018-04-27 ENCOUNTER — Encounter: Payer: Self-pay | Admitting: Internal Medicine

## 2018-04-27 ENCOUNTER — Ambulatory Visit (INDEPENDENT_AMBULATORY_CARE_PROVIDER_SITE_OTHER): Payer: Medicare Other | Admitting: Internal Medicine

## 2018-04-27 VITALS — BP 168/84 | HR 56 | Temp 97.8°F | Resp 16 | Ht 70.0 in | Wt 171.0 lb

## 2018-04-27 DIAGNOSIS — R7303 Prediabetes: Secondary | ICD-10-CM

## 2018-04-27 DIAGNOSIS — I10 Essential (primary) hypertension: Secondary | ICD-10-CM

## 2018-04-27 DIAGNOSIS — Z85828 Personal history of other malignant neoplasm of skin: Secondary | ICD-10-CM

## 2018-04-27 DIAGNOSIS — I351 Nonrheumatic aortic (valve) insufficiency: Secondary | ICD-10-CM

## 2018-04-27 DIAGNOSIS — Z0001 Encounter for general adult medical examination with abnormal findings: Secondary | ICD-10-CM

## 2018-04-27 DIAGNOSIS — Z125 Encounter for screening for malignant neoplasm of prostate: Secondary | ICD-10-CM

## 2018-04-27 DIAGNOSIS — H3530 Unspecified macular degeneration: Secondary | ICD-10-CM | POA: Insufficient documentation

## 2018-04-27 DIAGNOSIS — H353190 Nonexudative age-related macular degeneration, unspecified eye, stage unspecified: Secondary | ICD-10-CM | POA: Diagnosis not present

## 2018-04-27 DIAGNOSIS — E782 Mixed hyperlipidemia: Secondary | ICD-10-CM

## 2018-04-27 LAB — LIPID PANEL
CHOL/HDL RATIO: 3
CHOLESTEROL: 169 mg/dL (ref 0–200)
HDL: 49.8 mg/dL (ref >39.00)
LDL CALC: 96 mg/dL (ref 0–99)
NonHDL: 119.5
TRIGLYCERIDES: 118 mg/dL (ref 0.0–149.0)
VLDL: 23.6 mg/dL (ref 0.0–40.0)

## 2018-04-27 LAB — HEMOGLOBIN A1C: HEMOGLOBIN A1C: 5.9 % (ref 4.6–6.5)

## 2018-04-27 LAB — COMPREHENSIVE METABOLIC PANEL
ALBUMIN: 4.7 g/dL (ref 3.5–5.2)
ALT: 14 U/L (ref 0–53)
AST: 17 U/L (ref 0–37)
Alkaline Phosphatase: 79 U/L (ref 39–117)
BILIRUBIN TOTAL: 0.9 mg/dL (ref 0.2–1.2)
BUN: 15 mg/dL (ref 6–23)
CALCIUM: 10.1 mg/dL (ref 8.4–10.5)
CHLORIDE: 101 meq/L (ref 96–112)
CO2: 29 meq/L (ref 19–32)
CREATININE: 1 mg/dL (ref 0.40–1.50)
GFR: 76.04 mL/min (ref >60.00)
Glucose, Bld: 112 mg/dL — ABNORMAL HIGH (ref 70–99)
Potassium: 3.8 mEq/L (ref 3.5–5.1)
Sodium: 141 mEq/L (ref 135–145)
Total Protein: 6.9 g/dL (ref 6.0–8.3)

## 2018-04-27 LAB — CBC WITH DIFFERENTIAL/PLATELET
BASOS PCT: 1 % (ref 0.0–3.0)
Basophils Absolute: 0.1 10*3/uL (ref 0.0–0.1)
EOS ABS: 0.7 10*3/uL (ref 0.0–0.7)
EOS PCT: 7.7 % — AB (ref 0.0–5.0)
HEMATOCRIT: 49.3 % (ref 39.0–52.0)
Hemoglobin: 16.8 g/dL (ref 13.0–17.0)
LYMPHS PCT: 33.4 % (ref 12.0–46.0)
Lymphs Abs: 3 10*3/uL (ref 0.7–4.0)
MCHC: 34.1 g/dL (ref 30.0–36.0)
MCV: 89.4 fl (ref 78.0–100.0)
MONO ABS: 0.7 10*3/uL (ref 0.1–1.0)
Monocytes Relative: 7.4 % (ref 3.0–12.0)
Neutro Abs: 4.5 10*3/uL (ref 1.4–7.7)
Neutrophils Relative %: 50.5 % (ref 43.0–77.0)
Platelets: 278 10*3/uL (ref 150.0–400.0)
RBC: 5.51 Mil/uL (ref 4.22–5.81)
RDW: 13.2 % (ref 11.5–15.5)
WBC: 8.9 10*3/uL (ref 4.0–10.5)

## 2018-04-27 LAB — TSH: TSH: 1.09 u[IU]/mL (ref 0.35–4.50)

## 2018-04-27 LAB — PSA, MEDICARE: PSA: 1.52 ng/ml (ref 0.10–4.00)

## 2018-04-27 MED ORDER — AZITHROMYCIN 250 MG PO TABS
ORAL_TABLET | ORAL | 0 refills | Status: DC
Start: 2018-04-27 — End: 2018-06-18

## 2018-04-27 NOTE — Assessment & Plan Note (Signed)
Well controlled at home Elevated here - white htn Continue current medication cmp

## 2018-04-27 NOTE — Assessment & Plan Note (Signed)
Check a1c Low sugar / carb diet Stressed regular exercise   

## 2018-04-27 NOTE — Assessment & Plan Note (Signed)
Mild AR  Will continue to monitor Last echo 2012

## 2018-04-27 NOTE — Assessment & Plan Note (Signed)
Sees Dr Ronnald Ramp annually Recent [facility]

## 2018-04-27 NOTE — Assessment & Plan Note (Signed)
Not on medication Check lipid, cmp, tsh Continue healthy diet, regular exercise

## 2018-04-29 ENCOUNTER — Other Ambulatory Visit: Payer: Self-pay | Admitting: Internal Medicine

## 2018-04-29 DIAGNOSIS — I10 Essential (primary) hypertension: Secondary | ICD-10-CM

## 2018-05-15 DIAGNOSIS — Z23 Encounter for immunization: Secondary | ICD-10-CM | POA: Diagnosis not present

## 2018-06-17 ENCOUNTER — Other Ambulatory Visit: Payer: Self-pay | Admitting: Internal Medicine

## 2018-06-17 DIAGNOSIS — I10 Essential (primary) hypertension: Secondary | ICD-10-CM

## 2018-06-18 ENCOUNTER — Ambulatory Visit (INDEPENDENT_AMBULATORY_CARE_PROVIDER_SITE_OTHER): Payer: Medicare Other | Admitting: Internal Medicine

## 2018-06-18 ENCOUNTER — Encounter: Payer: Self-pay | Admitting: Internal Medicine

## 2018-06-18 VITALS — BP 146/74 | HR 62 | Temp 97.7°F | Resp 16 | Wt 170.0 lb

## 2018-06-18 DIAGNOSIS — R42 Dizziness and giddiness: Secondary | ICD-10-CM

## 2018-06-18 DIAGNOSIS — H9203 Otalgia, bilateral: Secondary | ICD-10-CM

## 2018-06-18 DIAGNOSIS — K5900 Constipation, unspecified: Secondary | ICD-10-CM | POA: Diagnosis not present

## 2018-06-18 MED ORDER — SCOPOLAMINE 1 MG/3DAYS TD PT72: 1.0000 | MEDICATED_PATCH | TRANSDERMAL | 0 refills | Status: DC

## 2018-06-18 NOTE — Assessment & Plan Note (Signed)
He is experiencing very transient waves of imbalance-not true dizziness ?  Inner ear or central He did have an episode while in the office and it was after a quicker movement so it may be the inner ear Will discuss with ENT and then decide on further evaluation from there

## 2018-06-18 NOTE — Assessment & Plan Note (Signed)
Most likely eustachian tube dysfunction or TMJ Taking Claritin and using Flonase-may need to try different allergy medications Not typical of his usual TMJ symptoms He does have an appointment with ENT next week and I advised that he keep this for further evaluation He can also discuss with ENT his imbalance to help [place] figure out if this is related to his inner ears or not

## 2018-06-18 NOTE — Assessment & Plan Note (Signed)
This is a chronic problem, but has gotten worse-harder to control He has tried stool softeners and MiraLAX and they have not been effective He eats very healthy, drinks plenty water and exercises regularly and I do not think there is anything more he can do with diet Discussed different things he can try including taking a stool softener at a lower dose but more regular Trial of probiotics Trial of supplemental fiber, such as Metamucil, but here and he gets a lot of natural fiber Can consider Linzess Can try over-the-counter magnesium supplementation  No alarm symptoms such as weight loss, blood in the stool so we will hold off on GI evaluation, but if there is no improvement we can consider this

## 2018-06-18 NOTE — Progress Notes (Signed)
Subjective:    Patient ID: Mark Copeland, male    DOB: October 06, 1934, 84 y.o.   MRN: [id number]  HPI The patient is here for an acute visit.   Ear discomfort clogged sensation.  He is using Claritin and Flonase daily.  He continues to have a feeling of his ears being plugged and occasionally will have ear pain.  Popping his ears gives temporary relief of the pressure.  The pain comes randomly and is not related to eating.  He has had TMJ in the past and denies tenderness of the TMJ joint-he states the ear pain feels different.  Feeling of imbalance: He is also been experiencing a feeling of imbalance.  It is not a true spinning sensation.  It is very transient.  He states it is not always associated with changes in position or head movements.  And just over a week he will be going on a cruise and is concerned about this getting worse causing more problems.  Constipation: He eats very healthy-intensive vegetables and fruit.  He exercises regularly and drinks plenty of water.  Over the years he is always had some constipation, which he has been able to control.  Constipation is getting a little worse.  He has tried a stool softener and MiraLAX and they have not helped.  He will take a laxative as needed because of the stool will sometimes be very hard.  The laxatives work, but then he has a lot of gas.  And he develops very hard stool and has to strain and has hemorrhoids that can bleed.  The stool will eventually become loose over time and the cycle recurs.  He feels he is having a harder time controlling this and not sure what else to do.  He denies any change in appetite, weight loss or blood in the stool.     Medications and allergies reviewed with patient and updated if appropriate.  Patient Active Problem List   Diagnosis Date Noted  . Macular degeneration 04/27/2018  . Mild aortic regurgitation 04/27/2018  . Radiculopathy of leg 07/09/2017  . 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 TMJ  arthralgia 04/25/2017  . Hiatal hernia, small 04/23/2016  . Erectile dysfunction 05/06/2012  . Bladder outlet obstruction 05/06/2012  . Anxiety 03/03/2012  . Hypertension 11/13/2011  . Abnormal chest x-ray 11/13/2011  . NEPHROLITHIASIS, HX OF 10/14/2010  . Prediabetes 10/11/2010  . SKIN CANCER, HX OF 10/11/2010  . INTENTION TREMOR 09/15/2009  . BPH (benign prostatic hyperplasia) 09/15/2009  . History of colonic polyps 12/05/2008  . Hyperlipidemia 02/18/2008  . Esophageal reflux 02/18/2008  . INTERSTITIAL CYSTITIS 02/18/2008    Current Outpatient Medications on File Prior to Visit  Medication Sig Dispense Refill  . amLODipine (NORVASC) 5 MG tablet TAKE 1 TABLET BY MOUTH  DAILY 90 tablet 3  . fexofenadine (ALLEGRA) 180 MG tablet Take 180 mg by mouth daily as needed (allergies).    . fluticasone (FLONASE) 50 MCG/ACT nasal spray Place 2 sprays into the nose 2 (two) times daily as needed for rhinitis or allergies.    Marland Kitchen losartan-hydrochlorothiazide (HYZAAR) 50-12.5 MG tablet TAKE 1 TABLET BY MOUTH  DAILY 90 tablet 2  . pentosan polysulfate (ELMIRON) 100 MG capsule Take by mouth.    . sildenafil (REVATIO) 20 MG tablet 2-5 as needed for erections     No current facility-administered medications on file prior to visit.     Past Medical History:  Diagnosis Date  . BPH (benign prostatic hypertrophy)   .  Colonic polyp 2011   Dr.John Amedeo Plenty  . Fasting hyperglycemia   . Hiatal hernia 2012  . Hyperlipidemia   . Interstitial cystitis    presentation in 1997 w/ abdominal pain/distention, Dr. Lawrence Santiago, [facility]  . Nephrolithiasis    x 2  . Skin cancer    basal cell, Dr.Dan Ronnald Ramp    Past Surgical History:  Procedure Laterality Date  . COLONOSCOPY W/ POLYPECTOMY     and esophageal dilation 01/2008 by Dr.Hayes; bladder stones s/p urethral dilation 10/2007 by Dr. Amalia Hailey  . CYSTOSCOPY     2004/2011; Hospitalized 1997 w/ I.C.  . LAPAROSCOPIC CHOLECYSTECTOMY  07/14/11  . SEPTOPLASTY    .  TONSILLECTOMY    . UPPER GASTROINTESTINAL ENDOSCOPY  2012   Dr Teena Irani; hiatal hernia    Social History   Socioeconomic History  . Marital status: Married    Spouse name: Not on file  . Number of children: Not on file  . Years of education: Not on file  . Highest education level: Not on file  Occupational History  . Not on file  Social Needs  . Financial resource strain: Not on file  . Food insecurity:    Worry: Not on file    Inability: Not on file  . Transportation needs:    Medical: Not on file    Non-medical: Not on file  Tobacco Use  . Smoking status: Former Smoker    Last attempt to quit: 08/04/1960    Years since quitting: 57.9  . Smokeless tobacco: Never Used  . Tobacco comment: smoked 1958-1961, up to 2 cigars / day  Substance and Sexual Activity  . Alcohol use: Yes    Comment:  occasional wine  . Drug use: No  . Sexual activity: Not Currently  Lifestyle  . Physical activity:    Days per week: Not on file    Minutes per session: Not on file  . Stress: Not on file  Relationships  . Social connections:    Talks on phone: Not on file    Gets together: Not on file    Attends religious service: Not on file    Active member of club or organization: Not on file    Attends meetings of clubs or organizations: Not on file    Relationship status: Not on file  Other Topics Concern  . Not on file  Social History Narrative   Regular exercise- yes           Family History  Problem Relation Age of Onset  . Transient ischemic attack Father 61  . Hypertension Father   . Coronary artery disease Father   . Stroke Mother 57  . Hypertension Brother   . Diabetes Neg Hx   . Cancer Neg Hx     Review of Systems  Constitutional: Negative for chills and fever.  HENT: Positive for ear pain (ear plugged; occ pain) and sneezing. Negative for congestion and postnasal drip.   Respiratory: Negative for cough, shortness of breath and wheezing.   Gastrointestinal:  Positive for abdominal distention (With constipation), abdominal pain (Mild with constipation), anal bleeding (Hemorrhoidal in nature) and constipation. Negative for blood in stool and nausea.  Neurological: Negative for dizziness (No true spinning, but a feeling of imbalance intermittently) and headaches.       Objective:   Vitals:   06/18/18 1039  BP: (!) 146/74  Pulse: 62  Resp: 16  Temp: 97.7 F (36.5 C)  SpO2: 98%   BP Readings  from Last 3 Encounters:  06/18/18 (!) 146/74  04/27/18 (!) 168/84  12/11/17 138/84   Wt Readings from Last 3 Encounters:  06/18/18 170 lb (77.1 kg)  04/27/18 171 lb (77.6 kg)  12/11/17 179 lb (81.2 kg)   Body mass index is 24.39 kg/m.   Physical Exam  Constitutional: He appears well-developed and well-nourished. No distress.  HENT:  Head: Normocephalic and atraumatic.  Right Ear: External ear normal.  Left Ear: External ear normal.  Mouth/Throat: Oropharynx is clear and moist.  Bilateral ear canals and tympanic membranes normal.  No TMJ pain with palpation or movement  Eyes: Conjunctivae are normal.  Neck: Normal range of motion. Neck supple. No tracheal deviation present. No thyromegaly present.  Abdominal: Soft. He exhibits no distension. There is no tenderness.  Lymphadenopathy:    He has no cervical adenopathy.  Skin: Skin is warm and dry. He is not diaphoretic.           Assessment & Plan:    See Problem List for Assessment and Plan of chronic medical problems.

## 2018-06-18 NOTE — Patient Instructions (Signed)
Try probiotics ( Align or Florastar are good products), over the counter magnesium, try taking lower doses of the dulcolax more regularly.    See ENT    Constipation, Adult Constipation is when a person has fewer bowel movements in a week than normal, has difficulty having a bowel movement, or has stools that are dry, hard, or larger than normal. Constipation may be caused by an underlying condition. It may become worse with age if a person takes certain medicines and does not take in enough fluids. Follow these instructions at home: Eating and drinking   Eat foods that have a lot of fiber, such as fresh fruits and vegetables, whole grains, and beans.  Limit foods that are high in fat, low in fiber, or overly processed, such as french fries, hamburgers, cookies, candies, and soda.  Drink enough fluid to keep your urine clear or pale yellow. General instructions  Exercise regularly or as told by your health care provider.  Go to the restroom when you have the urge to go. Do not hold it in.  Take over-the-counter and prescription medicines only as told by your health care provider. These include any fiber supplements.  Practice pelvic floor retraining exercises, such as deep breathing while relaxing the lower abdomen and pelvic floor relaxation during bowel movements.  Watch your condition for any changes.  Keep all follow-up visits as told by your health care provider. This is important. Contact a health care provider if:  You have pain that gets worse.  You have a fever.  You do not have a bowel movement after 4 days.  You vomit.  You are not hungry.  You lose weight.  You are bleeding from the anus.  You have thin, pencil-like stools. Get help right away if:  You have a fever and your symptoms suddenly get worse.  You leak stool or have blood in your stool.  Your abdomen is bloated.  You have severe pain in your abdomen.  You feel dizzy or you faint. This  information is not intended to replace advice given to you by your health care provider. Make sure you discuss any questions you have with your health care provider. Document Released: 07/26/2004 Document Revised: 05/17/2016 Document Reviewed: 04/17/2016 Elsevier Interactive Patient Education  2018 [employer].

## 2018-06-25 DIAGNOSIS — H9193 Unspecified hearing loss, bilateral: Secondary | ICD-10-CM | POA: Diagnosis not present

## 2018-06-25 DIAGNOSIS — H938X2 Other specified disorders of left ear: Secondary | ICD-10-CM | POA: Diagnosis not present

## 2018-06-25 DIAGNOSIS — R42 Dizziness and giddiness: Secondary | ICD-10-CM | POA: Diagnosis not present

## 2018-06-25 DIAGNOSIS — M26623 Arthralgia of bilateral temporomandibular joint: Secondary | ICD-10-CM | POA: Diagnosis not present

## 2018-06-25 DIAGNOSIS — H903 Sensorineural hearing loss, bilateral: Secondary | ICD-10-CM | POA: Diagnosis not present

## 2018-06-28 ENCOUNTER — Emergency Department (HOSPITAL_BASED_OUTPATIENT_CLINIC_OR_DEPARTMENT_OTHER)
Admission: EM | Admit: 2018-06-28 | Discharge: 2018-06-28 | Disposition: A | Discharge status: Home / Self Care | Payer: Medicare Other | Attending: Emergency Medicine | Admitting: Emergency Medicine

## 2018-06-28 ENCOUNTER — Encounter (HOSPITAL_BASED_OUTPATIENT_CLINIC_OR_DEPARTMENT_OTHER): Payer: Self-pay | Admitting: *Deleted

## 2018-06-28 ENCOUNTER — Emergency Department (HOSPITAL_BASED_OUTPATIENT_CLINIC_OR_DEPARTMENT_OTHER): Payer: Medicare Other

## 2018-06-28 ENCOUNTER — Other Ambulatory Visit: Payer: Self-pay

## 2018-06-28 DIAGNOSIS — R42 Dizziness and giddiness: Secondary | ICD-10-CM | POA: Insufficient documentation

## 2018-06-28 DIAGNOSIS — H9193 Unspecified hearing loss, bilateral: Secondary | ICD-10-CM | POA: Insufficient documentation

## 2018-06-28 DIAGNOSIS — I10 Essential (primary) hypertension: Secondary | ICD-10-CM | POA: Insufficient documentation

## 2018-06-28 DIAGNOSIS — H9313 Tinnitus, bilateral: Secondary | ICD-10-CM | POA: Diagnosis not present

## 2018-06-28 DIAGNOSIS — Z79899 Other long term (current) drug therapy: Secondary | ICD-10-CM | POA: Insufficient documentation

## 2018-06-28 DIAGNOSIS — Z87891 Personal history of nicotine dependence: Secondary | ICD-10-CM | POA: Insufficient documentation

## 2018-06-28 DIAGNOSIS — Z85828 Personal history of other malignant neoplasm of skin: Secondary | ICD-10-CM | POA: Insufficient documentation

## 2018-06-28 LAB — CBC WITH DIFFERENTIAL/PLATELET
BASOS ABS: 0.1 10*3/uL (ref 0.0–0.1)
BASOS PCT: 1 %
EOS ABS: 0.8 10*3/uL — AB (ref 0.0–0.7)
Eosinophils Relative: 10 %
HCT: 43.6 % (ref 39.0–52.0)
HEMOGLOBIN: 14.9 g/dL (ref 13.0–17.0)
Lymphocytes Relative: 35 %
Lymphs Abs: 3 10*3/uL (ref 0.7–4.0)
MCH: 30.3 pg (ref 26.0–34.0)
MCHC: 34.2 g/dL (ref 30.0–36.0)
MCV: 88.6 fL (ref 78.0–100.0)
Monocytes Absolute: 0.7 10*3/uL (ref 0.1–1.0)
Monocytes Relative: 8 %
NEUTROS PCT: 46 %
Neutro Abs: 4.1 10*3/uL (ref 1.7–7.7)
Platelets: 258 10*3/uL (ref 150–400)
RBC: 4.92 MIL/uL (ref 4.22–5.81)
RDW: 12.8 % (ref 11.5–15.5)
WBC: 8.7 10*3/uL (ref 4.0–10.5)

## 2018-06-28 LAB — BASIC METABOLIC PANEL
Anion gap: 10 (ref 5–15)
BUN: 21 mg/dL (ref 8–23)
CHLORIDE: 103 mmol/L (ref 98–111)
CO2: 28 mmol/L (ref 22–32)
CREATININE: 1.02 mg/dL (ref 0.61–1.24)
Calcium: 9.3 mg/dL (ref 8.9–10.3)
Glucose, Bld: 112 mg/dL — ABNORMAL HIGH (ref 70–99)
POTASSIUM: 4 mmol/L (ref 3.5–5.1)
Sodium: 141 mmol/L (ref 135–145)

## 2018-06-28 MED ORDER — DEXAMETHASONE SODIUM PHOSPHATE 10 MG/ML IJ SOLN
10.0000 mg | Freq: Once | INTRAMUSCULAR | Status: AC
Start: 2018-06-28 — End: 2018-06-28
  Administered 2018-06-28: 10 mg via INTRAVENOUS
  Filled 2018-06-28: qty 1

## 2018-06-28 MED ORDER — MECLIZINE HCL 12.5 MG PO TABS: 12.5000 mg | ORAL_TABLET | Freq: Two times a day (BID) | ORAL | 0 refills | Status: DC | PRN

## 2018-06-28 MED ORDER — IOPAMIDOL (ISOVUE-370) INJECTION 76%
100.0000 mL | Freq: Once | INTRAVENOUS | Status: AC | PRN
  Administered 2018-06-28: 100 mL via INTRAVENOUS

## 2018-06-28 MED ORDER — MECLIZINE HCL 25 MG PO TABS
12.5000 mg | ORAL_TABLET | Freq: Once | ORAL | Status: AC
  Administered 2018-06-28: 12.5 mg via ORAL
  Filled 2018-06-28: qty 1

## 2018-06-28 MED ORDER — DIAZEPAM 2 MG PO TABS: 2.0000 mg | ORAL_TABLET | Freq: Two times a day (BID) | ORAL | 0 refills | Status: DC | PRN

## 2018-06-28 NOTE — Discharge Instructions (Addendum)
As we discussed, some of your symptoms are very consistent with a possible inner ear problem. I'd recommend discussing this again with your doctor and ENT specialists.  For now, your CT imaging showed no evidence of stroke which is reassuring. As we discussed, this is not 100% sensitive and I'd recommend discussing with Dr. Quay Burow as you may ultimately need an MRI.  For now, we will attempt the following treatments to see if we can improve your dizziness/disequilibrium:  - Eat a low-sodium diet, less than 2-3 g of sodium total per day - Avoid caffeine and nicotine as much as possible - For dizziness: --- For mild dizziness, take the meclizine as prescribed. --- For severe dizziness, we will prescribe diazepam which is a stronger medication but can help during severe episodes  If you develop any of these symptoms, return to the ER immediately: Slurred speech Difficulty swallowing Double vision Numbness or weakness on one side of your body Facial drooping

## 2018-06-28 NOTE — ED Triage Notes (Addendum)
Here for dis-equilibrium, intermittent for weeks, more so in the morning, seen by PCP Quay Burow) and ENT Redmond Baseman) for the same, taking ibuprofen for TMJ, describes as staggering gait with nausea (denies: fever, vomiting, pain, HA, sob, falls, syncope, visual changes, weakness, unilateral sx or other sx).  Steady gait.  Alert, NAD, calm, interactive, resps e/u, speaking in clear complete sentences, no dyspnea noted, skin W&D, VSS, BP elevated, "takes only BP meds", EDP into room. Family at [facility].

## 2018-06-28 NOTE — ED Provider Notes (Addendum)
[facility] Provider Note   CSN: [id number] Arrival date & time: 06/28/18  0256     History   Chief Complaint Chief Complaint  Patient presents with  . Dizziness    HPI Mark Copeland is a 84 y.o. male.  HPI  84 yo M with PMHx as below here with dizziness, intermittent, ringing ears. Pt states that for the past several weeks, he's had progressively worsening episodes of imbalance. They tend to occur in the mornings and he states he is walking and feels like he is "off balance," has to catch his balance. He has some associated tinnitus bilaterally. He's also had recent decreased hearing. He states he was seen by PCP and sent to an ENT, who thought he may have TMJ and placed him on ibuprofen. He's bene on high dose ibuprofen x 3 days without relief. He's travelling this coming week so he presents for evaluation. No associated difficulty swallowing or speaking. No focal numbness or weakness. Sx seem to be worse in AM, but states they are very different form when he stands up and gets orthostatic. He has not tried anything for dizziness. Denies any CP, SOB, palpitations. No headache.  Per review of records, on 06/18/18 pt seen by PCP with c/o ear pressure and occasional dizziness spells, was referred to ENT.  Of note, pt sx have not worsened today, but he is travelling so wanted to be evaluated again.   Past Medical History:  Diagnosis Date  . BPH (benign prostatic hypertrophy)   . Colonic polyp 2011   Dr.John Amedeo Plenty  . Fasting hyperglycemia   . Hiatal hernia 2012  . Hyperlipidemia   . Interstitial cystitis    presentation in 1997 w/ abdominal pain/distention, Dr. Lawrence Santiago, [facility]  . Nephrolithiasis    x 2  . Skin cancer    basal cell, Dr.Dan Ronnald Ramp    Patient Active Problem List   Diagnosis Date Noted  . Otalgia of both ears 06/18/2018  . Dizziness 06/18/2018  . Macular degeneration 04/27/2018  . Mild aortic regurgitation 04/27/2018  .  Radiculopathy of leg 07/09/2017  . 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 TMJ arthralgia 04/25/2017  . Hiatal hernia, small 04/23/2016  . Erectile dysfunction 05/06/2012  . Bladder outlet obstruction 05/06/2012  . Anxiety 03/03/2012  . Hypertension 11/13/2011  . Abnormal chest x-ray 11/13/2011  . Constipation 11/02/2010  . NEPHROLITHIASIS, HX OF 10/14/2010  . Prediabetes 10/11/2010  . SKIN CANCER, HX OF 10/11/2010  . INTENTION TREMOR 09/15/2009  . BPH (benign prostatic hyperplasia) 09/15/2009  . History of colonic polyps 12/05/2008  . Hyperlipidemia 02/18/2008  . Esophageal reflux 02/18/2008  . INTERSTITIAL CYSTITIS 02/18/2008    Past Surgical History:  Procedure Laterality Date  . COLONOSCOPY W/ POLYPECTOMY     and esophageal dilation 01/2008 by Dr.Hayes; bladder stones s/p urethral dilation 10/2007 by Dr. Amalia Hailey  . CYSTOSCOPY     2004/2011; Hospitalized 1997 w/ [facility]  . LAPAROSCOPIC CHOLECYSTECTOMY  07/14/11  . SEPTOPLASTY    . TONSILLECTOMY    . UPPER GASTROINTESTINAL ENDOSCOPY  2012   Dr Teena Irani; hiatal hernia        Home Medications    Prior to Admission medications   Medication Sig Start Date End Date Taking? Authorizing Provider  amLODipine (NORVASC) 5 MG tablet TAKE 1 TABLET BY MOUTH  DAILY 04/29/18   Binnie Rail, MD  diazepam (VALIUM) 2 MG tablet Take 1 tablet (2 mg total) by mouth every 12 (twelve) hours  as needed (severe vertigo). 06/28/18   Duffy Bruce, MD  fexofenadine (ALLEGRA) 180 MG tablet Take 180 mg by mouth daily as needed (allergies).    [provider]  fluticasone (FLONASE) 50 MCG/ACT nasal spray Place 2 sprays into the nose 2 (two) times daily as needed for rhinitis or allergies.    [provider]  losartan-hydrochlorothiazide (HYZAAR) 50-12.5 MG tablet TAKE 1 TABLET BY MOUTH  DAILY 06/17/18   Binnie Rail, MD  meclizine (ANTIVERT) 12.5 MG tablet Take 1 tablet (12.5 mg total) by mouth 2 (two) times daily as needed for dizziness.  06/28/18   Duffy Bruce, MD  pentosan polysulfate (ELMIRON) 100 MG capsule Take by mouth. 01/30/17   [provider]  scopolamine (TRANSDERM-SCOP, 1.5 MG,) 1 MG/3DAYS Place 1 patch (1.5 mg total) onto the skin every 3 (three) days. 06/18/18   Binnie Rail, MD  sildenafil (REVATIO) 20 MG tablet 2-5 as needed for erections 08/07/17   [provider]    Family History Family History  Problem Relation Age of Onset  . Transient ischemic attack Father 9  . Hypertension Father   . Coronary artery disease Father   . Stroke Mother 62  . Hypertension Brother   . Diabetes Neg Hx   . Cancer Neg Hx     Social History Social History   Tobacco Use  . Smoking status: Former Smoker    Last attempt to quit: 08/04/1960    Years since quitting: 57.9  . Smokeless tobacco: Never Used  . Tobacco comment: smoked 1958-1961, up to 2 cigars / day  Substance Use Topics  . Alcohol use: Yes    Comment:  occasional wine  . Drug use: No     Allergies   Nitrofurantoin; Penicillins; Sulfonamide derivatives; Amlodipine; Shellfish allergy; and Tamsulosin   Review of Systems Review of Systems  Constitutional: Positive for fatigue. Negative for chills and fever.  HENT: Positive for tinnitus. Negative for congestion and rhinorrhea.   Eyes: Negative for visual disturbance.  Respiratory: Negative for cough, shortness of breath and wheezing.   Cardiovascular: Negative for chest pain and leg swelling.  Gastrointestinal: Negative for abdominal pain, diarrhea, nausea and vomiting.  Genitourinary: Negative for dysuria and flank pain.  Musculoskeletal: Negative for neck pain and neck stiffness.  Skin: Negative for rash and wound.  Allergic/Immunologic: Negative for immunocompromised state.  Neurological: Positive for dizziness. Negative for syncope, weakness and headaches.  All other systems reviewed and are negative.    Physical Exam Updated Vital Signs BP (!) 142/82   Pulse (!) 51    Temp 97.6 F (36.4 C) (Oral)   Resp 13   Wt 78.1 kg   SpO2 97%   BMI 24.71 kg/m   Physical Exam  Constitutional: He is oriented to person, place, and time. He appears well-developed and well-nourished. No distress.  HENT:  Head: Normocephalic and atraumatic.  Normal TMs bilaterally, minimal serous effusions. Mastoids normal b/l.  Eyes: Conjunctivae are normal.  Neck: Neck supple.  Cardiovascular: Normal rate, regular rhythm and normal heart sounds. Exam reveals no friction rub.  No murmur heard. Pulmonary/Chest: Effort normal and breath sounds normal. No respiratory distress. He has no wheezes. He has no rales.  Abdominal: He exhibits no distension.  Musculoskeletal: He exhibits no edema.  Neurological: He is alert and oriented to person, place, and time. He exhibits normal muscle tone.  Skin: Skin is warm. Capillary refill takes less than 2 seconds.  Psychiatric: He has a normal mood and  affect.  Nursing note and vitals reviewed.   Neurological Exam:  Mental Status: Alert and oriented to person, place, and time. Attention and concentration normal. Speech clear. Recent memory is intact. Cranial Nerves: Visual fields grossly intact. EOMI and PERRLA. No nystagmus noted. Facial sensation intact at forehead, maxillary cheek, and chin/mandible bilaterally. No facial asymmetry or weakness. Hearing grossly normal. Uvula is midline, and palate elevates symmetrically. Normal SCM and trapezius strength. Tongue midline without fasciculations. Motor: Muscle strength 5/5 in proximal and distal UE and LE bilaterally. No pronator drift. Muscle tone normal. Reflexes: 2+ and symmetrical in all four extremities.  Sensation: Intact to light touch in upper and lower extremities distally bilaterally.  Gait: Normal without ataxia. Coordination: Normal FTN bilaterally.     ED Treatments / Results  Labs (all labs ordered are listed, but only abnormal results are displayed) Labs Reviewed  CBC WITH  DIFFERENTIAL/PLATELET - Abnormal; Notable for the following components:      Result Value   Eosinophils Absolute 0.8 (*)    All other components within normal limits  BASIC METABOLIC PANEL - Abnormal; Notable for the following components:   Glucose, Bld 112 (*)    All other components within normal limits    EKG EKG Interpretation  Date/Time:  Sunday June 28 2018 03:34:56 EDT Ventricular Rate:  51 PR Interval:    QRS Duration: 100 QT Interval:  446 QTC Calculation: 411 R Axis:   -12 Text Interpretation:  Sinus rhythm Prolonged PR interval Since last EKG, rate has decreased Otherwise no significant change Confirmed by Duffy Bruce [phone]) on 06/28/2018 3:37:26 AM   Radiology Ct Angio Head W Or Wo Contrast  Result Date: 06/28/2018 CLINICAL DATA:  Initial evaluation for episodic vertigo. EXAM: CT ANGIOGRAPHY HEAD AND NECK TECHNIQUE: Multidetector CT imaging of the head and neck was performed using the standard protocol during bolus administration of intravenous contrast. Multiplanar CT image reconstructions and MIPs were obtained to evaluate the vascular anatomy. Carotid stenosis measurements (when applicable) are obtained utilizing NASCET criteria, using the distal internal carotid diameter as the denominator. CONTRAST:  119mL ISOVUE-370 IOPAMIDOL (ISOVUE-370) INJECTION 76% COMPARISON:  Prior MRI from 10/24/2011. FINDINGS: CT HEAD FINDINGS Brain: Age-related cerebral atrophy. No acute intracranial hemorrhage. No acute large vessel territory infarct. No mass lesion, midline shift or mass effect. No hydrocephalus. No extra-axial fluid collection. Vascular: No hyperdense vessel. Scattered vascular calcifications noted within the carotid siphons. Skull: Scalp soft tissues within normal limits.  Calvarium intact. Sinuses: Mild scattered mucosal thickening within the ethmoidal air cells and maxillary sinuses. Paranasal sinuses are otherwise clear. No mastoid effusion. Orbits: Globes and orbital  soft tissues demonstrate no acute finding. Review of the MIP images confirms the above findings CTA NECK FINDINGS Aortic arch: Visualized aortic arch of normal caliber with normal 3 vessel morphology. Mild atheromatous plaque within the proximal descending intrathoracic aorta. No hemodynamically significant stenosis about the origin of the great vessels. Visualized subclavian arteries widely patent. Right carotid system: Right common carotid artery widely patent from its origin to the bifurcation without stenosis. Mild scattered atheromatous plaque about the right bifurcation without stenosis. Right ICA widely patent to the skull base without stenosis, dissection, or occlusion. Left carotid system: Left common carotid artery patent from its origin to the bifurcation without stenosis. Minimal atheromatous plaque about the left bifurcation without stenosis. Left ICA widely patent to the skull base without stenosis, dissection, or occlusion. Vertebral arteries: Both of the vertebral arteries arise from the subclavian arteries. Mild focal plaque at  the origin of the vertebral arteries bilaterally without significant stenosis. Vertebral arteries widely patent within the neck without stenosis, dissection, or occlusion. Skeleton: No acute osseous abnormality. Multiple scattered lucent lesions seen within the cervical spine, likely degenerative in nature, although myeloma can conceivably have this appearance as well. Moderate cervical spondylolysis at C5-6. Other neck: No acute soft tissue abnormality within the neck. Thyroid normal. Salivary glands normal. No adenopathy. Upper chest: Visualized upper chest demonstrates no acute finding. Partially visualized lungs are grossly clear. Review of the MIP images confirms the above findings CTA HEAD FINDINGS Anterior circulation: Petrous segments widely patent bilaterally. Cavernous and supraclinoid right ICA widely patent to the terminus. Mild plaque within the cavernous and  supraclinoid right ICA with relatively mild stenosis. ICA termini widely patent. A1 segments widely patent. Normal anterior communicating artery. Anterior cerebral arteries widely patent to their distal aspects without stenosis. Widely patent M1 segments. Distal MCA branches well perfused and symmetric. Posterior circulation: Mild non stenotic plaque within the right V4 segment as it crosses into the cranial vault. Vertebral arteries otherwise widely patent to the vertebrobasilar junction. Right vertebral artery slightly dominant. Posterior inferior cerebral arteries patent bilaterally. Basilar widely patent to its distal aspect. Superior cerebral arteries patent bilaterally. Both of the PCAs primarily supplied via the basilar. Basilar tip ectatic without focal aneurysm. Tiny 2 mm outpouching at the origin of the left P1 segment, likely a small vascular infundibulum, relatively stable from previous MRA. PCAs widely patent to their distal aspects. Venous sinuses: Patent. Anatomic variants: None significant. Delayed phase: No abnormal enhancement. Review of the MIP images confirms the above findings IMPRESSION: 1. Negative CTA of the head and neck. Major arterial vasculature of the head and neck is widely patent. No evidence for vertebrobasilar insufficiency. Minor atherosclerotic change for patient age as above. 2. 2 mm focal outpouching at the origin of the left P1 segment, stable relative to previous MRA from 2012, most likely reflecting a small vascular infundibulum. 3. Scattered osseous lucent lesions within the cervical spine, felt to almost certainly be degenerative in nature, although myeloma could also conceivably have this appearance. Correlation with laboratory values suggested. Electronically Signed   By: Jeannine Boga M.D.   On: 06/28/2018 05:23   Ct Angio Neck W And/or Wo Contrast  Result Date: 06/28/2018 CLINICAL DATA:  Initial evaluation for episodic vertigo. EXAM: CT ANGIOGRAPHY HEAD AND  NECK TECHNIQUE: Multidetector CT imaging of the head and neck was performed using the standard protocol during bolus administration of intravenous contrast. Multiplanar CT image reconstructions and MIPs were obtained to evaluate the vascular anatomy. Carotid stenosis measurements (when applicable) are obtained utilizing NASCET criteria, using the distal internal carotid diameter as the denominator. CONTRAST:  121mL ISOVUE-370 IOPAMIDOL (ISOVUE-370) INJECTION 76% COMPARISON:  Prior MRI from 10/24/2011. FINDINGS: CT HEAD FINDINGS Brain: Age-related cerebral atrophy. No acute intracranial hemorrhage. No acute large vessel territory infarct. No mass lesion, midline shift or mass effect. No hydrocephalus. No extra-axial fluid collection. Vascular: No hyperdense vessel. Scattered vascular calcifications noted within the carotid siphons. Skull: Scalp soft tissues within normal limits.  Calvarium intact. Sinuses: Mild scattered mucosal thickening within the ethmoidal air cells and maxillary sinuses. Paranasal sinuses are otherwise clear. No mastoid effusion. Orbits: Globes and orbital soft tissues demonstrate no acute finding. Review of the MIP images confirms the above findings CTA NECK FINDINGS Aortic arch: Visualized aortic arch of normal caliber with normal 3 vessel morphology. Mild atheromatous plaque within the proximal descending intrathoracic aorta. No hemodynamically significant stenosis  about the origin of the great vessels. Visualized subclavian arteries widely patent. Right carotid system: Right common carotid artery widely patent from its origin to the bifurcation without stenosis. Mild scattered atheromatous plaque about the right bifurcation without stenosis. Right ICA widely patent to the skull base without stenosis, dissection, or occlusion. Left carotid system: Left common carotid artery patent from its origin to the bifurcation without stenosis. Minimal atheromatous plaque about the left bifurcation  without stenosis. Left ICA widely patent to the skull base without stenosis, dissection, or occlusion. Vertebral arteries: Both of the vertebral arteries arise from the subclavian arteries. Mild focal plaque at the origin of the vertebral arteries bilaterally without significant stenosis. Vertebral arteries widely patent within the neck without stenosis, dissection, or occlusion. Skeleton: No acute osseous abnormality. Multiple scattered lucent lesions seen within the cervical spine, likely degenerative in nature, although myeloma can conceivably have this appearance as well. Moderate cervical spondylolysis at C5-6. Other neck: No acute soft tissue abnormality within the neck. Thyroid normal. Salivary glands normal. No adenopathy. Upper chest: Visualized upper chest demonstrates no acute finding. Partially visualized lungs are grossly clear. Review of the MIP images confirms the above findings CTA HEAD FINDINGS Anterior circulation: Petrous segments widely patent bilaterally. Cavernous and supraclinoid right ICA widely patent to the terminus. Mild plaque within the cavernous and supraclinoid right ICA with relatively mild stenosis. ICA termini widely patent. A1 segments widely patent. Normal anterior communicating artery. Anterior cerebral arteries widely patent to their distal aspects without stenosis. Widely patent M1 segments. Distal MCA branches well perfused and symmetric. Posterior circulation: Mild non stenotic plaque within the right V4 segment as it crosses into the cranial vault. Vertebral arteries otherwise widely patent to the vertebrobasilar junction. Right vertebral artery slightly dominant. Posterior inferior cerebral arteries patent bilaterally. Basilar widely patent to its distal aspect. Superior cerebral arteries patent bilaterally. Both of the PCAs primarily supplied via the basilar. Basilar tip ectatic without focal aneurysm. Tiny 2 mm outpouching at the origin of the left P1 segment, likely a  small vascular infundibulum, relatively stable from previous MRA. PCAs widely patent to their distal aspects. Venous sinuses: Patent. Anatomic variants: None significant. Delayed phase: No abnormal enhancement. Review of the MIP images confirms the above findings IMPRESSION: 1. Negative CTA of the head and neck. Major arterial vasculature of the head and neck is widely patent. No evidence for vertebrobasilar insufficiency. Minor atherosclerotic change for patient age as above. 2. 2 mm focal outpouching at the origin of the left P1 segment, stable relative to previous MRA from 2012, most likely reflecting a small vascular infundibulum. 3. Scattered osseous lucent lesions within the cervical spine, felt to almost certainly be degenerative in nature, although myeloma could also conceivably have this appearance. Correlation with laboratory values suggested. Electronically Signed   By: Jeannine Boga M.D.   On: 06/28/2018 05:23    Procedures Procedures (including critical care time)  Medications Ordered in ED Medications  dexamethasone (DECADRON) injection 10 mg (has no administration in time range)  meclizine (ANTIVERT) tablet 12.5 mg (12.5 mg Oral Given 06/28/18 0344)  iopamidol (ISOVUE-370) 76 % injection 100 mL (100 mLs Intravenous Contrast Given 06/28/18 0424)     Initial Impression / Assessment and Plan / ED Course  I have reviewed the triage vital signs and the nursing notes.  Pertinent labs & imaging results that were available during my care of the patient were reviewed by me and considered in my medical decision making (see chart for details).  Clinical  Course as of Jun 28 542  Sun Jun 28, 2018  0346 84 yo M here w/ intermittent dizziness. History is most c/w likely inner ear dysfunction, suspected Meniere's given tinnitus, vertigo, hearing loss. However, DDx includes VBA insufficiency, less likely occult stroke or cerebellar mass given duration, intermittency of sx. Will check CT  Angio, basic labs.   [CI]  [id number] Labs reassuring. CBC wnl. BMP with normal renal function.   [CI]  0431 Normal orthostatics   [CI]  0541 CT angio negative for vascular or other abnormality. This is reassuring in setting of constant sx x weeks. Doubt VBA insufficiency. Pt has incidental degenerative changes in spine; mention of ? Myeloma though labs are not c/w this. Otherwise, pt is improved in ED. He is ambulatory without difficulty. Had a long discussion with pt, wife re: DDx and low, but persistent risk of cerebellar abnormalities which would only be identified on MRI. At this time, pt feels improved and given chronicity of complaints, would like to attempt outpt management. Will start on low-dose meclizine, valium PRN, low sodium, avoid caffeine, tx supportively for possible Meniere's. Will refer him back to his ENT and have him call his PCP for likely outpt MRI. Pt updated and in agreement.   [CI]    Clinical Course User Index [CI] Duffy Bruce, MD     Final Clinical Impressions(s) / ED Diagnoses   Final diagnoses:  Disequilibrium    ED Discharge Orders         Ordered    meclizine (ANTIVERT) 12.5 MG tablet  2 times daily PRN     06/28/18 0537    diazepam (VALIUM) 2 MG tablet  Every 12 hours PRN     06/28/18 0537           Duffy Bruce, MD 06/28/18 Roney Marion    Duffy Bruce, MD 06/28/18 [id number]

## 2018-06-28 NOTE — ED Notes (Signed)
Back from CT, alert, NAD, calm, interactive, no changes.  Wife at [facility].

## 2018-07-28 DIAGNOSIS — H43813 Vitreous degeneration, bilateral: Secondary | ICD-10-CM | POA: Diagnosis not present

## 2018-07-28 DIAGNOSIS — H2513 Age-related nuclear cataract, bilateral: Secondary | ICD-10-CM | POA: Diagnosis not present

## 2018-08-13 ENCOUNTER — Ambulatory Visit (INDEPENDENT_AMBULATORY_CARE_PROVIDER_SITE_OTHER): Payer: Medicare Other

## 2018-08-13 DIAGNOSIS — Z23 Encounter for immunization: Secondary | ICD-10-CM | POA: Diagnosis not present

## 2018-08-19 ENCOUNTER — Encounter: Payer: Self-pay | Admitting: Internal Medicine

## 2018-08-19 DIAGNOSIS — I10 Essential (primary) hypertension: Secondary | ICD-10-CM

## 2018-08-20 ENCOUNTER — Other Ambulatory Visit: Payer: Self-pay

## 2018-08-20 MED ORDER — TELMISARTAN-HCTZ 40-12.5 MG PO TABS: 1.0000 | ORAL_TABLET | Freq: Every day | ORAL | 1 refills | Status: DC

## 2018-08-20 NOTE — Patient Outreach (Signed)
[facility]) Care Management  08/20/2018  ROWYN SPILDE 1934-04-07 [id number]   Medication Adherence call to Mr. Isiaah Cuervo spoke with patient's wife she did not want to engage she said she only speak to [facility] people. Mr. Minton is showing past due under [place]. Patient is due on Lisinopril/HCTZ 50/12.5 mg.   [facility] Management Direct Dial [phone]  Fax [phone] [email or web address]

## 2018-08-26 ENCOUNTER — Encounter: Payer: Self-pay | Admitting: Internal Medicine

## 2018-08-27 ENCOUNTER — Other Ambulatory Visit: Payer: Self-pay

## 2018-08-27 MED ORDER — TELMISARTAN-HCTZ 40-12.5 MG PO TABS: 1.0000 | ORAL_TABLET | Freq: Every day | ORAL | 1 refills | Status: DC
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
## 2018-10-30 ENCOUNTER — Encounter: Payer: Self-pay | Admitting: Internal Medicine

## 2018-10-30 ENCOUNTER — Ambulatory Visit (INDEPENDENT_AMBULATORY_CARE_PROVIDER_SITE_OTHER): Payer: Medicare Other | Admitting: Internal Medicine

## 2018-10-30 VITALS — BP 176/76 | HR 65 | Temp 98.0°F | Resp 18 | Ht 70.0 in | Wt 176.0 lb

## 2018-10-30 DIAGNOSIS — F329 Major depressive disorder, single episode, unspecified: Secondary | ICD-10-CM | POA: Diagnosis not present

## 2018-10-30 DIAGNOSIS — F32A Depression, unspecified: Secondary | ICD-10-CM | POA: Insufficient documentation

## 2018-10-30 DIAGNOSIS — F419 Anxiety disorder, unspecified: Secondary | ICD-10-CM

## 2018-10-30 MED ORDER — SERTRALINE HCL 25 MG PO TABS: 25.0000 mg | ORAL_TABLET | Freq: Every day | ORAL | 5 refills | Status: DC

## 2018-10-30 NOTE — Assessment & Plan Note (Signed)
Having anxiety and depression related to christmas and things that have occurred in his past - this occurs yearly Having GI symptoms suggestive of IBS - likely related to stress/anxiety/depression - never has these symptoms Defers therapy Interested in medication Had increased urination with celexa Will try sertraline 25 mg nightly Call if he has side effects

## 2018-10-30 NOTE — Patient Instructions (Addendum)
   Medications reviewed and updated.  Changes include :   Start sertraline 25 mg nightly.  If you have side effects please call.    Your prescription(s) have been submitted to your pharmacy. Please take as directed and contact our office if you believe you are having problem(s) with the medication(s).

## 2018-11-21 ENCOUNTER — Other Ambulatory Visit: Payer: Self-pay | Admitting: Internal Medicine

## 2019-01-07 ENCOUNTER — Encounter (INDEPENDENT_AMBULATORY_CARE_PROVIDER_SITE_OTHER): Payer: Self-pay | Admitting: Ophthalmology

## 2019-01-29 ENCOUNTER — Other Ambulatory Visit: Payer: Self-pay | Admitting: Internal Medicine

## 2019-03-30 ENCOUNTER — Encounter: Payer: Medicare Other | Admitting: Internal Medicine

## 2019-03-31 DIAGNOSIS — H43813 Vitreous degeneration, bilateral: Secondary | ICD-10-CM | POA: Diagnosis not present

## 2019-03-31 DIAGNOSIS — H353131 Nonexudative age-related macular degeneration, bilateral, early dry stage: Secondary | ICD-10-CM | POA: Diagnosis not present

## 2019-03-31 DIAGNOSIS — H2513 Age-related nuclear cataract, bilateral: Secondary | ICD-10-CM | POA: Diagnosis not present

## 2019-04-01 ENCOUNTER — Other Ambulatory Visit: Payer: Self-pay | Admitting: Internal Medicine

## 2019-04-01 DIAGNOSIS — I10 Essential (primary) hypertension: Secondary | ICD-10-CM

## 2019-04-19 DIAGNOSIS — L821 Other seborrheic keratosis: Secondary | ICD-10-CM | POA: Diagnosis not present

## 2019-04-19 DIAGNOSIS — Z85828 Personal history of other malignant neoplasm of skin: Secondary | ICD-10-CM | POA: Diagnosis not present

## 2019-04-19 DIAGNOSIS — C44319 Basal cell carcinoma of skin of other parts of face: Secondary | ICD-10-CM | POA: Diagnosis not present

## 2019-04-19 DIAGNOSIS — L570 Actinic keratosis: Secondary | ICD-10-CM | POA: Diagnosis not present

## 2019-04-19 DIAGNOSIS — L309 Dermatitis, unspecified: Secondary | ICD-10-CM | POA: Diagnosis not present

## 2019-04-26 ENCOUNTER — Telehealth: Payer: Self-pay | Admitting: Internal Medicine

## 2019-04-26 DIAGNOSIS — I10 Essential (primary) hypertension: Secondary | ICD-10-CM

## 2019-04-26 MED ORDER — AMLODIPINE BESYLATE 5 MG PO TABS: 5.0000 mg | ORAL_TABLET | Freq: Every day | ORAL | 0 refills | Status: DC

## 2019-04-26 MED ORDER — TELMISARTAN-HCTZ 40-12.5 MG PO TABS: 1.0000 | ORAL_TABLET | Freq: Every day | ORAL | 0 refills | Status: DC

## 2019-04-26 NOTE — Telephone Encounter (Signed)
Medications sent to pharmacy

## 2019-04-26 NOTE — Telephone Encounter (Signed)
Copied from [place] [phone]. Topic: Quick Communication - Rx Refill/Question >> Apr 26, 2019 11:52 AM Sheran Luz wrote: Medication: telmisartan-hydrochlorothiazide (MICARDIS HCT) 40-12.5 MG tablet and  amLODipine (NORVASC) 5 MG tablet   Patient is requesting a refill of these medications. Patient has appointment 7/10.   Preferred Pharmacy (with phone number or street name):  [place] [place] [facility]  [phone] (Phone) [phone] (Fax)

## 2019-04-28 DIAGNOSIS — C44319 Basal cell carcinoma of skin of other parts of face: Secondary | ICD-10-CM | POA: Diagnosis not present

## 2019-05-07 ENCOUNTER — Encounter: Payer: Medicare Other | Admitting: Internal Medicine

## 2019-05-20 ENCOUNTER — Encounter: Payer: Self-pay | Admitting: Internal Medicine

## 2019-05-20 NOTE — Patient Instructions (Addendum)
Tests ordered today. Your results will be released to [place] (or called to you) after review.  If any changes need to be made, you will be notified at that same time.  All other Health Maintenance issues reviewed.   All recommended immunizations and age-appropriate screenings are up-to-date or discussed.  No immunization administered today.   Medications reviewed and updated.  Changes include :   none  Your prescription(s) have been submitted to your pharmacy. Please take as directed and contact our office if you believe you are having problem(s) with the medication(s).   Please followup in 12 months     Health Maintenance, Male Adopting a healthy lifestyle and getting preventive care are important in promoting health and wellness. Ask your health care provider about:  The right schedule for you to have regular tests and exams.  Things you can do on your own to prevent diseases and keep yourself healthy. What should I know about diet, weight, and exercise? Eat a healthy diet   Eat a diet that includes plenty of vegetables, fruits, low-fat dairy products, and lean protein.  Do not eat a lot of foods that are high in solid fats, added sugars, or sodium. Maintain a healthy weight Body mass index (BMI) is a measurement that can be used to identify possible weight problems. It estimates body fat based on height and weight. Your health care provider can help determine your BMI and help you achieve or maintain a healthy weight. Get regular exercise Get regular exercise. This is one of the most important things you can do for your health. Most adults should:  Exercise for at least 150 minutes each week. The exercise should increase your heart rate and make you sweat (moderate-intensity exercise).  Do strengthening exercises at least twice a week. This is in addition to the moderate-intensity exercise.  Spend less time sitting. Even light physical activity can be beneficial. Watch  cholesterol and blood lipids Have your blood tested for lipids and cholesterol at 83 years of age, then have this test every 5 years. You may need to have your cholesterol levels checked more often if:  Your lipid or cholesterol levels are high.  You are older than 83 years of age.  You are at high risk for heart disease. What should I know about cancer screening? Many types of cancers can be detected early and may often be prevented. Depending on your health history and family history, you may need to have cancer screening at various ages. This may include screening for:  Colorectal cancer.  Prostate cancer.  Skin cancer.  Lung cancer. What should I know about heart disease, diabetes, and high blood pressure? Blood pressure and heart disease  High blood pressure causes heart disease and increases the risk of stroke. This is more likely to develop in people who have high blood pressure readings, are of African descent, or are overweight.  Talk with your health care provider about your target blood pressure readings.  Have your blood pressure checked: ? Every 3-5 years if you are 50-22 years of age. ? Every year if you are 49 years old or older.  If you are between the ages of 30 and 42 and are a current or former smoker, ask your health care provider if you should have a one-time screening for abdominal aortic aneurysm (AAA). Diabetes Have regular diabetes screenings. This checks your fasting blood sugar level. Have the screening done:  Once every three years after age 39 if you are  at a normal weight and have a low risk for diabetes.  More often and at a younger age if you are overweight or have a high risk for diabetes. What should I know about preventing infection? Hepatitis B If you have a higher risk for hepatitis B, you should be screened for this virus. Talk with your health care provider to find out if you are at risk for hepatitis B infection. Hepatitis C Blood  testing is recommended for:  Everyone born from [birth year] through 1965.  Anyone with known risk factors for hepatitis C. Sexually transmitted infections (STIs)  You should be screened each year for STIs, including gonorrhea and chlamydia, if: ? You are sexually active and are younger than 83 years of age. ? You are older than 83 years of age and your health care provider tells you that you are at risk for this type of infection. ? Your sexual activity has changed since you were last screened, and you are at increased risk for chlamydia or gonorrhea. Ask your health care provider if you are at risk.  Ask your health care provider about whether you are at high risk for HIV. Your health care provider may recommend a prescription medicine to help prevent HIV infection. If you choose to take medicine to prevent HIV, you should first get tested for HIV. You should then be tested every 3 months for as long as you are taking the medicine. Follow these instructions at home: Lifestyle  Do not use any products that contain nicotine or tobacco, such as cigarettes, e-cigarettes, and chewing tobacco. If you need help quitting, ask your health care provider.  Do not use street drugs.  Do not share needles.  Ask your health care provider for help if you need support or information about quitting drugs. Alcohol use  Do not drink alcohol if your health care provider tells you not to drink.  If you drink alcohol: ? Limit how much you have to 0-2 drinks a day. ? Be aware of how much alcohol is in your drink. In the [place] one drink equals one 12 oz bottle of beer (355 mL), one 5 oz glass of wine (148 mL), or one 1 oz glass of hard liquor (44 mL). General instructions  Schedule regular health, dental, and eye exams.  Stay current with your vaccines.  Tell your health care provider if: ? You often feel depressed. ? You have ever been abused or do not feel safe at home. Summary  Adopting a healthy  lifestyle and getting preventive care are important in promoting health and wellness.  Follow your health care provider's instructions about healthy diet, exercising, and getting tested or screened for diseases.  Follow your health care provider's instructions on monitoring your cholesterol and blood pressure. This information is not intended to replace advice given to you by your health care provider. Make sure you discuss any questions you have with your health care provider. Document Released: 04/25/2008 Document Revised: 10/21/2018 Document Reviewed: 10/21/2018 Elsevier Patient Education  2020 [employer].

## 2019-05-20 NOTE — Progress Notes (Signed)
Subjective:    Patient ID: Mark Copeland, male    DOB: 1934-03-13, 85 y.o.   MRN: [id number]  HPI He is here for a physical exam.   He feels good overall.    When he first starts walking he initially feels short of breath and struggles for air a little and he will back down on the intensity of his exercise and it improves and then he can pick it up again.  This is something that is new for him.  When he first starts walking he has to go up a hill, but that is not unusual.  He is never warmed up and does not cook stretching but his routine is not changed.  This happens when he rides his bike as well.  He has no difficulty getting up to a strenuous workout later in the workout not experiencing any shortness of breath.  He denies any chest pain or palpitations.  He was unsure if this was related to him taking his blood pressure medication before or after he works out for eating before or after he works out.  He was just concerned because this was something different for him when he was on for a while.  He denies any concerning symptoms with his ADLs such as chest pain, palpitations and shortness of breath.    Medications and allergies reviewed with patient and updated if appropriate.  Patient Active Problem List   Diagnosis Date Noted  . Anxiety and depression 10/30/2018  . Otalgia of both ears 06/18/2018  . Dizziness 06/18/2018  . Macular degeneration 04/27/2018  . Mild aortic regurgitation 04/27/2018  . Radiculopathy of leg 07/09/2017  . 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 TMJ arthralgia 04/25/2017  . Hiatal hernia, small 04/23/2016  . Erectile dysfunction 05/06/2012  . Bladder outlet obstruction 05/06/2012  . Hypertension 11/13/2011  . Abnormal chest x-ray 11/13/2011  . Constipation 11/02/2010  . NEPHROLITHIASIS, HX OF 10/14/2010  . Prediabetes 10/11/2010  . SKIN CANCER, HX OF 10/11/2010  . INTENTION TREMOR 09/15/2009  . BPH (benign prostatic hyperplasia) 09/15/2009  .  History of colonic polyps 12/05/2008  . Hyperlipidemia 02/18/2008  . Esophageal reflux 02/18/2008  . INTERSTITIAL CYSTITIS 02/18/2008    Current Outpatient Medications on File Prior to Visit  Medication Sig Dispense Refill  . amLODipine (NORVASC) 5 MG tablet Take 1 tablet (5 mg total) by mouth daily. 90 tablet 0  . fexofenadine (ALLEGRA) 180 MG tablet Take 180 mg by mouth daily as needed (allergies).    . fluticasone (FLONASE) 50 MCG/ACT nasal spray Place 2 sprays into the nose 2 (two) times daily as needed for rhinitis or allergies.    Marland Kitchen telmisartan-hydrochlorothiazide (MICARDIS HCT) 40-12.5 MG tablet Take 1 tablet by mouth daily. 90 tablet 0   No current facility-administered medications on file prior to visit.     Past Medical History:  Diagnosis Date  . BPH (benign prostatic hypertrophy)   . Colonic polyp 2011   Dr.John Amedeo Plenty  . Fasting hyperglycemia   . Hiatal hernia 2012  . Hyperlipidemia   . Interstitial cystitis    presentation in 1997 w/ abdominal pain/distention, Dr. Lawrence Santiago, [facility]  . Nephrolithiasis    x 2  . Skin cancer    basal cell, Dr.Dan Ronnald Ramp    Past Surgical History:  Procedure Laterality Date  . COLONOSCOPY W/ POLYPECTOMY     and esophageal dilation 01/2008 by Dr.Hayes; bladder stones s/p urethral dilation 10/2007 by Dr. Amalia Hailey  . CYSTOSCOPY  2004/2011; Hospitalized 1997 w/ I.C.  . LAPAROSCOPIC CHOLECYSTECTOMY  07/14/11  . SEPTOPLASTY    . TONSILLECTOMY    . UPPER GASTROINTESTINAL ENDOSCOPY  2012   Dr Teena Irani; hiatal hernia    Social History   Socioeconomic History  . Marital status: Married    Spouse name: Not on file  . Number of children: Not on file  . Years of education: Not on file  . Highest education level: Not on file  Occupational History  . Not on file  Social Needs  . Financial resource strain: Not on file  . Food insecurity    Worry: Not on file    Inability: Not on file  . Transportation needs    Medical: Not on file     Non-medical: Not on file  Tobacco Use  . Smoking status: Former Smoker    Quit date: 08/04/1960    Years since quitting: 58.8  . Smokeless tobacco: Never Used  . Tobacco comment: smoked 1958-1961, up to 2 cigars / day  Substance and Sexual Activity  . Alcohol use: Yes    Comment:  occasional wine  . Drug use: No  . Sexual activity: Not Currently  Lifestyle  . Physical activity    Days per week: Not on file    Minutes per session: Not on file  . Stress: Not on file  Relationships  . Social Herbalist on phone: Not on file    Gets together: Not on file    Attends religious service: Not on file    Active member of club or organization: Not on file    Attends meetings of clubs or organizations: Not on file    Relationship status: Not on file  Other Topics Concern  . Not on file  Social History Narrative   Regular exercise- yes           Family History  Problem Relation Age of Onset  . Transient ischemic attack Father 54  . Hypertension Father   . Coronary artery disease Father   . Stroke Mother 74  . Hypertension Brother   . Diabetes Neg Hx   . Cancer Neg Hx     Review of Systems  Constitutional: Negative for chills and fever.  Eyes: Negative for visual disturbance.  Respiratory: Positive for shortness of breath (only with strenuous exertion). Negative for cough and wheezing.   Cardiovascular: Negative for chest pain, palpitations and leg swelling.  Gastrointestinal: Negative for abdominal pain, blood in stool, constipation, diarrhea and nausea.       Occ gerd  Genitourinary: Negative for difficulty urinating, dysuria and hematuria.  Musculoskeletal: Negative for arthralgias and back pain.  Skin: Negative for rash.  Neurological: Negative for dizziness, light-headedness and headaches.  Psychiatric/Behavioral: Negative for dysphoric mood. The patient is not nervous/anxious.        Objective:   Vitals:   05/21/19 0815  BP: (!) 144/82  Pulse: (!) 56   Resp: 16  Temp: 98 F (36.7 C)  SpO2: 98%   Filed Weights   05/21/19 0815  Weight: 164 lb (74.4 kg)   Body mass index is 23.53 kg/m.   BP Readings from Last 3 Encounters:  05/21/19 (!) 144/82  10/30/18 (!) 176/76  06/28/18 (!) 141/85    Wt Readings from Last 3 Encounters:  05/21/19 164 lb (74.4 kg)  10/30/18 176 lb (79.8 kg)  06/28/18 172 lb 2.9 oz (78.1 kg)     Physical Exam Constitutional: He appears  well-developed and well-nourished. No distress.  HENT:  Head: Normocephalic and atraumatic.  Right Ear: External ear normal.  Left Ear: External ear normal.  Mouth/Throat: Oropharynx is clear and moist.  Normal ear canals and TM b/l  Eyes: Conjunctivae and EOM are normal.  Neck: Neck supple. No tracheal deviation present. No thyromegaly present.  No carotid bruit  Cardiovascular: Normal rate, regular rhythm, normal heart sounds and intact distal pulses.   No murmur heard. Pulmonary/Chest: Effort normal and breath sounds normal. No respiratory distress. He has no wheezes. He has no rales.  Abdominal: Soft. He exhibits no distension. There is no tenderness.  Genitourinary: deferred  Musculoskeletal: He exhibits no edema.  Lymphadenopathy:   He has no cervical adenopathy.  Skin: Skin is warm and dry. He is not diaphoretic.  Psychiatric: He has a normal mood and affect. His behavior is normal.         Assessment & Plan:   Physical exam: Screening blood work  ordered Immunizations   All up to date Colonoscopy    No longer needed for age Eye exams    Up to date  Exercise   regular - walking, biking Weight   Normal BMI Skin - sees derm regularly - Dr Ronnald Ramp Substance abuse   none   See Problem List for Assessment and Plan of chronic medical problems.

## 2019-05-21 ENCOUNTER — Other Ambulatory Visit (INDEPENDENT_AMBULATORY_CARE_PROVIDER_SITE_OTHER): Payer: Medicare Other

## 2019-05-21 ENCOUNTER — Encounter: Payer: Self-pay | Admitting: Internal Medicine

## 2019-05-21 ENCOUNTER — Ambulatory Visit (INDEPENDENT_AMBULATORY_CARE_PROVIDER_SITE_OTHER): Payer: Medicare Other | Admitting: Internal Medicine

## 2019-05-21 ENCOUNTER — Other Ambulatory Visit: Payer: Self-pay

## 2019-05-21 VITALS — BP 144/82 | HR 56 | Temp 98.0°F | Resp 16 | Ht 70.0 in | Wt 164.0 lb

## 2019-05-21 DIAGNOSIS — E782 Mixed hyperlipidemia: Secondary | ICD-10-CM

## 2019-05-21 DIAGNOSIS — F329 Major depressive disorder, single episode, unspecified: Secondary | ICD-10-CM

## 2019-05-21 DIAGNOSIS — R0600 Dyspnea, unspecified: Secondary | ICD-10-CM | POA: Insufficient documentation

## 2019-05-21 DIAGNOSIS — R7303 Prediabetes: Secondary | ICD-10-CM | POA: Diagnosis not present

## 2019-05-21 DIAGNOSIS — N3010 Interstitial cystitis (chronic) without hematuria: Secondary | ICD-10-CM

## 2019-05-21 DIAGNOSIS — R0609 Other forms of dyspnea: Secondary | ICD-10-CM

## 2019-05-21 DIAGNOSIS — I10 Essential (primary) hypertension: Secondary | ICD-10-CM | POA: Diagnosis not present

## 2019-05-21 DIAGNOSIS — F419 Anxiety disorder, unspecified: Secondary | ICD-10-CM

## 2019-05-21 DIAGNOSIS — Z0000 Encounter for general adult medical examination without abnormal findings: Secondary | ICD-10-CM

## 2019-05-21 LAB — COMPREHENSIVE METABOLIC PANEL
ALT: 12 U/L (ref 0–53)
AST: 17 U/L (ref 0–37)
Albumin: 4.8 g/dL (ref 3.5–5.2)
Alkaline Phosphatase: 68 U/L (ref 39–117)
BUN: 18 mg/dL (ref 6–23)
CO2: 30 mEq/L (ref 19–32)
Calcium: 9.8 mg/dL (ref 8.4–10.5)
Chloride: 101 mEq/L (ref 96–112)
Creatinine, Ser: 1.1 mg/dL (ref 0.40–1.50)
GFR: 63.92 mL/min (ref >60.00)
Glucose, Bld: 113 mg/dL — ABNORMAL HIGH (ref 70–99)
Potassium: 4.2 mEq/L (ref 3.5–5.1)
Sodium: 140 mEq/L (ref 135–145)
Total Bilirubin: 0.7 mg/dL (ref 0.2–1.2)
Total Protein: 7.1 g/dL (ref 6.0–8.3)

## 2019-05-21 LAB — CBC WITH DIFFERENTIAL/PLATELET
Basophils Absolute: 0.1 10*3/uL (ref 0.0–0.1)
Basophils Relative: 1.2 % (ref 0.0–3.0)
Eosinophils Absolute: 0.6 10*3/uL (ref 0.0–0.7)
Eosinophils Relative: 7.5 % — ABNORMAL HIGH (ref 0.0–5.0)
HCT: 48.3 % (ref 39.0–52.0)
Hemoglobin: 16 g/dL (ref 13.0–17.0)
Lymphocytes Relative: 31.4 % (ref 12.0–46.0)
Lymphs Abs: 2.6 10*3/uL (ref 0.7–4.0)
MCHC: 33.2 g/dL (ref 30.0–36.0)
MCV: 91.2 fl (ref 78.0–100.0)
Monocytes Absolute: 0.6 10*3/uL (ref 0.1–1.0)
Monocytes Relative: 7.7 % (ref 3.0–12.0)
Neutro Abs: 4.4 10*3/uL (ref 1.4–7.7)
Neutrophils Relative %: 52.2 % (ref 43.0–77.0)
Platelets: 319 10*3/uL (ref 150.0–400.0)
RBC: 5.29 Mil/uL (ref 4.22–5.81)
RDW: 13 % (ref 11.5–15.5)
WBC: 8.4 10*3/uL (ref 4.0–10.5)

## 2019-05-21 LAB — LIPID PANEL
Cholesterol: 163 mg/dL (ref 0–200)
HDL: 53.1 mg/dL (ref >39.00)
LDL Cholesterol: 94 mg/dL (ref 0–99)
NonHDL: 109.84
Total CHOL/HDL Ratio: 3
Triglycerides: 77 mg/dL (ref 0.0–149.0)
VLDL: 15.4 mg/dL (ref 0.0–40.0)

## 2019-05-21 LAB — TSH: TSH: 1.27 u[IU]/mL (ref 0.35–4.50)

## 2019-05-21 LAB — HEMOGLOBIN A1C: Hgb A1c MFr Bld: 5.9 % (ref 4.6–6.5)

## 2019-05-21 MED ORDER — AMLODIPINE BESYLATE 5 MG PO TABS: 5.0000 mg | ORAL_TABLET | Freq: Every day | ORAL | 1 refills | Status: DC

## 2019-05-21 MED ORDER — TELMISARTAN-HCTZ 40-12.5 MG PO TABS: 1.0000 | ORAL_TABLET | Freq: Every day | ORAL | 1 refills | Status: DC

## 2019-05-21 NOTE — Assessment & Plan Note (Signed)
Following with urology

## 2019-05-21 NOTE — Assessment & Plan Note (Signed)
Past couple of months he has been experiencing dyspnea with strenuous exertion sometimes.  Typically this occurs when he first starts his workout and if he decreases the intensity of his workout it improves and later on he is able to go to a much more strenuous pace without difficulty He denies any changes to his routine and has always exercise regularly No chest pain, palpitations Discussed that since he is still able to do a strenuous exercise at different points in his workout without difficulty with getting a good breath it is unlikely to be something cardiac or pulmonary in nature He will start doing a longer warm and trying to vary his routine a little to see if that helps We will hold off on cardiology referral at this time, but if symptoms persist or worsen I will refer to cardiology

## 2019-05-21 NOTE — Assessment & Plan Note (Signed)
Check lipid panel, CMP, TSH Diet controlled Eats healthy and exercises regularly

## 2019-05-21 NOTE — Assessment & Plan Note (Signed)
Check a1c Low sugar / carb diet Stressed regular exercise   

## 2019-05-21 NOTE — Assessment & Plan Note (Signed)
Stop medication-no longer feels depressed or anxious Monitor off medication

## 2019-05-21 NOTE — Assessment & Plan Note (Signed)
Has an element of whitecoat hypertension Blood pressure well controlled at home Medication at current doses CMP

## 2019-08-02 ENCOUNTER — Other Ambulatory Visit: Payer: Self-pay

## 2019-08-02 MED ORDER — TELMISARTAN-HCTZ 40-12.5 MG PO TABS: 1.0000 | ORAL_TABLET | Freq: Every day | ORAL | 1 refills | Status: DC

## 2019-08-04 IMAGING — DX DG CHEST 2V
2 series · 2 of 2 positions shown · non-contrast
Comparison: 10/23/2011

CLINICAL DATA: Cough and substernal chest pain

EXAM:
CHEST  2 VIEW

[chest pa]
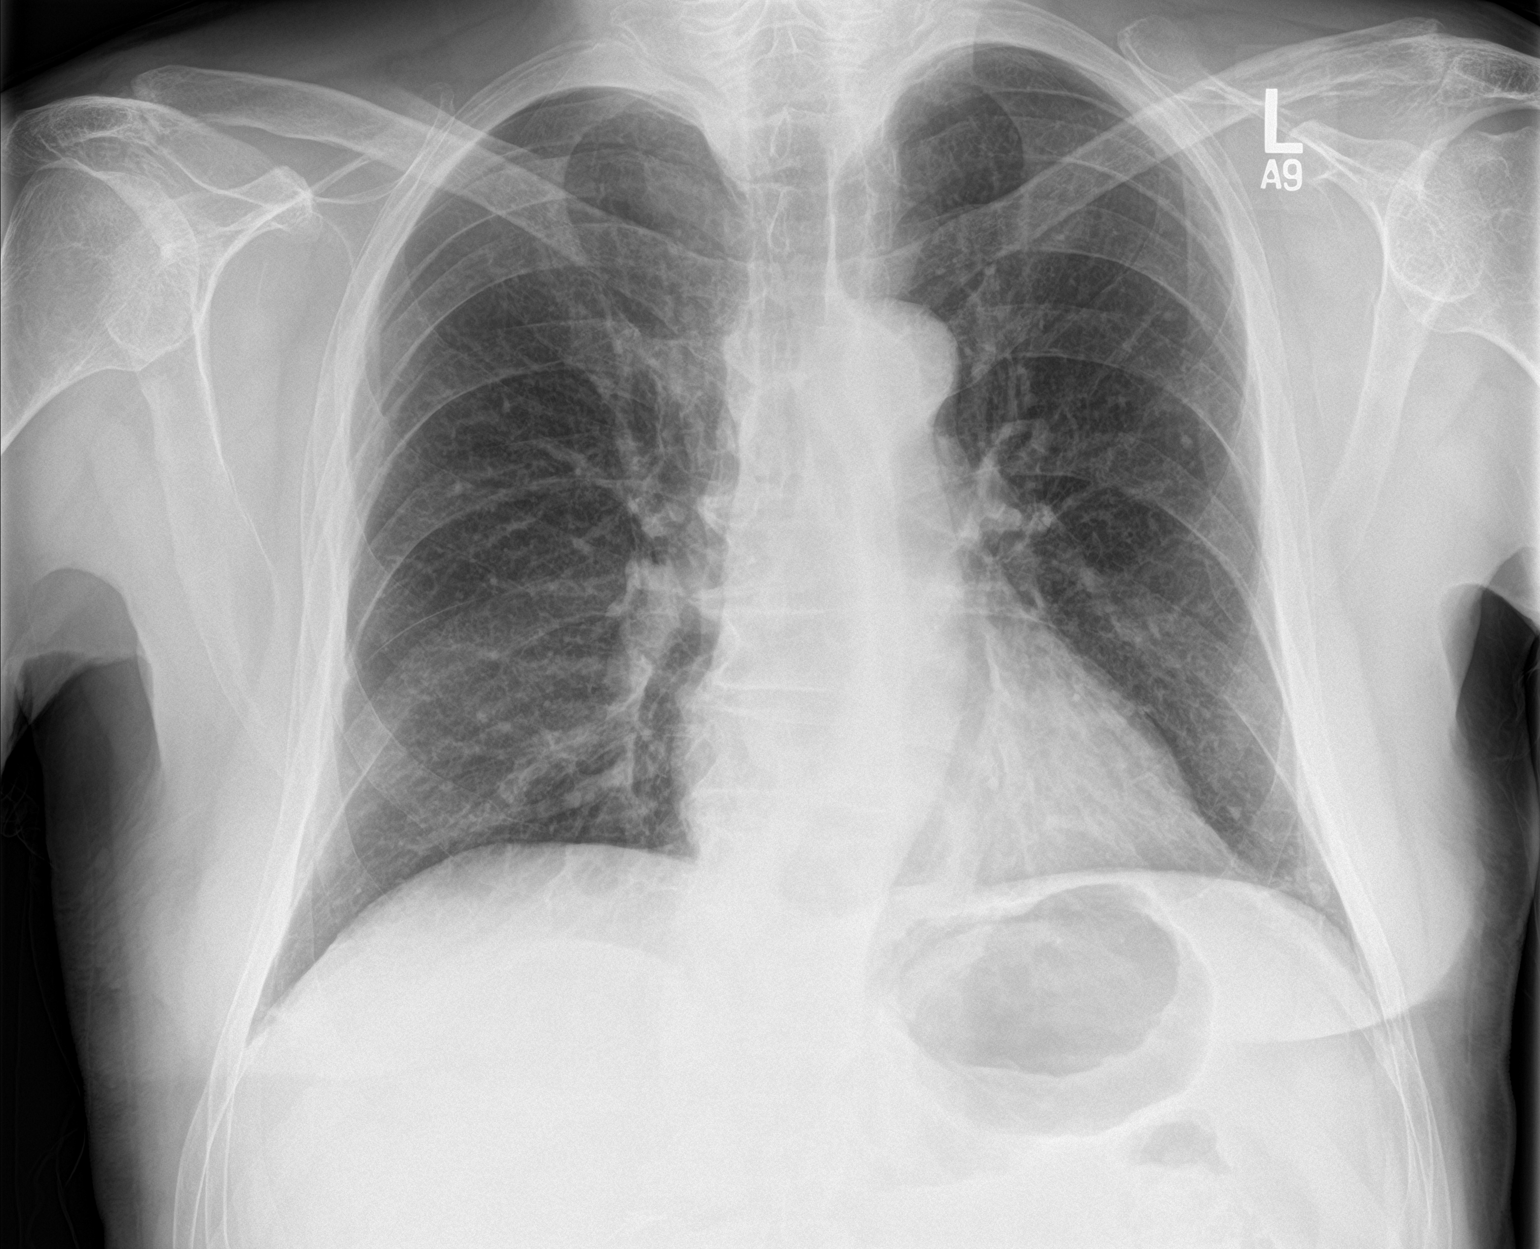

[chest lat]
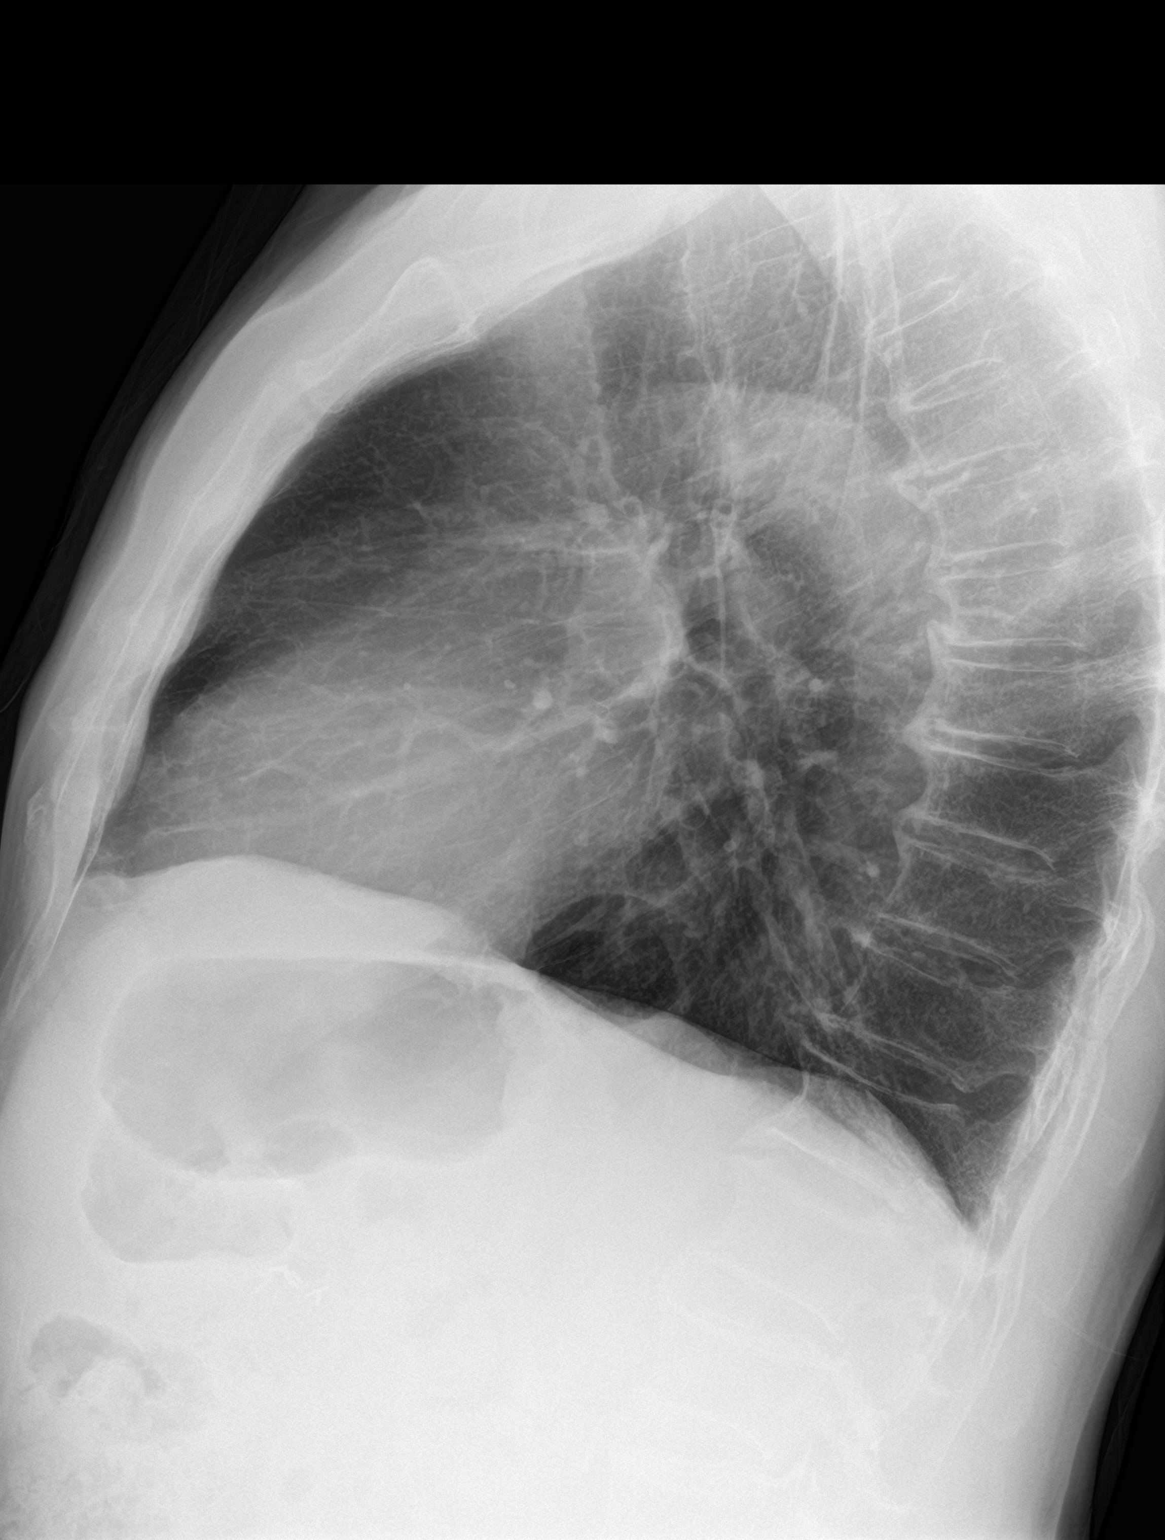

[2 of 2 positions shown; findings below may reference images not displayed]

FINDINGS: Cardiac shadow is within normal limits. The lungs are well aerated
bilaterally. No focal infiltrate or sizable effusion is seen.
Degenerative changes of the thoracic spine are noted.
IMPRESSION: No active cardiopulmonary disease.

## 2019-08-07 ENCOUNTER — Ambulatory Visit (INDEPENDENT_AMBULATORY_CARE_PROVIDER_SITE_OTHER): Payer: Medicare Other

## 2019-08-07 DIAGNOSIS — Z23 Encounter for immunization: Secondary | ICD-10-CM

## 2019-08-09 ENCOUNTER — Ambulatory Visit: Payer: Medicare Other | Admitting: Internal Medicine

## 2019-09-12 NOTE — Progress Notes (Signed)
Subjective:    Patient ID: Mark Copeland, male    DOB: 02-Mar-1934, 85 y.o.   MRN: [id number]  HPI The patient is here for an acute visit.  DOE:  Going up hill walking or biking he now gets SOB - it has been going on for about 3-4 months.   He exercises daily walks, sometimes rides his bike and does weights, push-ups.  This is something new for him and he was not sure if it was related to his age.  He denies any changes in activity.    Reflux: He gets reflux or a feeling of needed to vomit.  That has been going at night and when he exercises.  He will do 50 push ups every morning before breakfast and will feel it.  He feels it when lifting weights in the afternoon.  Cutting back on his exercise improves the symptoms.  He does not throw up.  He gets diaphoretic.  It subsides on its own.  It does not seem to be related to when he eats or what he eats.         Medications and allergies reviewed with patient and updated if appropriate.  Patient Active Problem List   Diagnosis Date Noted  . DOE (dyspnea on exertion) 05/21/2019  . Anxiety and depression 10/30/2018  . Bilateral sensorineural hearing loss 06/25/2018  . Otalgia of both ears 06/18/2018  . Dizziness 06/18/2018  . Macular degeneration 04/27/2018  . Mild aortic regurgitation 04/27/2018  . Radiculopathy of leg 07/09/2017  . 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 Bilateral temporomandibular joint pain 04/25/2017  . Hiatal hernia, small 04/23/2016  . Erectile dysfunction 05/06/2012  . Bladder outlet obstruction 05/06/2012  . Hypertension 11/13/2011  . Abnormal chest x-ray 11/13/2011  . Constipation 11/02/2010  . NEPHROLITHIASIS, HX OF 10/14/2010  . Prediabetes 10/11/2010  . SKIN CANCER, HX OF 10/11/2010  . INTENTION TREMOR 09/15/2009  . BPH (benign prostatic hyperplasia) 09/15/2009  . History of colonic polyps 12/05/2008  . Hyperlipidemia 02/18/2008  . Esophageal reflux 02/18/2008  . INTERSTITIAL CYSTITIS 02/18/2008     Current Outpatient Medications on File Prior to Visit  Medication Sig Dispense Refill  . amLODipine (NORVASC) 5 MG tablet Take 1 tablet (5 mg total) by mouth daily. 90 tablet 1  . fexofenadine (ALLEGRA) 180 MG tablet Take 180 mg by mouth daily as needed (allergies).    . fluticasone (FLONASE) 50 MCG/ACT nasal spray Place 2 sprays into the nose 2 (two) times daily as needed for rhinitis or allergies.    Marland Kitchen telmisartan-hydrochlorothiazide (MICARDIS HCT) 40-12.5 MG tablet Take 1 tablet by mouth daily. 90 tablet 1   No current facility-administered medications on file prior to visit.     Past Medical History:  Diagnosis Date  . BPH (benign prostatic hypertrophy)   . Colonic polyp 2011   Dr.John Amedeo Plenty  . Fasting hyperglycemia   . Hiatal hernia 2012  . Hyperlipidemia   . Interstitial cystitis    presentation in 1997 w/ abdominal pain/distention, Dr. Lawrence Santiago, [facility]  . Nephrolithiasis    x 2  . Skin cancer    basal cell, Dr.Dan Ronnald Ramp    Past Surgical History:  Procedure Laterality Date  . COLONOSCOPY W/ POLYPECTOMY     and esophageal dilation 01/2008 by Dr.Hayes; bladder stones s/p urethral dilation 10/2007 by Dr. Amalia Hailey  . CYSTOSCOPY     2004/2011; Hospitalized 1997 w/ I.C.  . LAPAROSCOPIC CHOLECYSTECTOMY  07/14/11  . SEPTOPLASTY    .  TONSILLECTOMY    . UPPER GASTROINTESTINAL ENDOSCOPY  2012   Dr Teena Irani; hiatal hernia    Social History   Socioeconomic History  . Marital status: Married    Spouse name: Not on file  . Number of children: Not on file  . Years of education: Not on file  . Highest education level: Not on file  Occupational History  . Not on file  Social Needs  . Financial resource strain: Not on file  . Food insecurity    Worry: Not on file    Inability: Not on file  . Transportation needs    Medical: Not on file    Non-medical: Not on file  Tobacco Use  . Smoking status: Former Smoker    Quit date: 08/04/1960    Years since quitting: 59.1  . Smokeless  tobacco: Never Used  . Tobacco comment: smoked 1958-1961, up to 2 cigars / day  Substance and Sexual Activity  . Alcohol use: Yes    Comment:  occasional wine  . Drug use: No  . Sexual activity: Not Currently  Lifestyle  . Physical activity    Days per week: Not on file    Minutes per session: Not on file  . Stress: Not on file  Relationships  . Social Herbalist on phone: Not on file    Gets together: Not on file    Attends religious service: Not on file    Active member of club or organization: Not on file    Attends meetings of clubs or organizations: Not on file    Relationship status: Not on file  Other Topics Concern  . Not on file  Social History Narrative   Regular exercise- yes           Family History  Problem Relation Age of Onset  . Transient ischemic attack Father 33  . Hypertension Father   . Coronary artery disease Father   . Stroke Mother 4  . Hypertension Brother   . Diabetes Neg Hx   . Cancer Neg Hx     Review of Systems  Constitutional: Negative for fever.  Respiratory: Positive for cough and shortness of breath (with walking/biking up hills - new). Negative for wheezing.   Cardiovascular: Negative for chest pain, palpitations and leg swelling.  Gastrointestinal:       Reflux  Neurological: Negative for dizziness, light-headedness and headaches.       Objective:   Vitals:   09/13/19 0745  BP: 140/72  Pulse: 72  Resp: 16  SpO2: 98%   BP Readings from Last 3 Encounters:  09/13/19 140/72  05/21/19 (!) 144/82  10/30/18 (!) 176/76   Wt Readings from Last 3 Encounters:  09/13/19 170 lb (77.1 kg)  05/21/19 164 lb (74.4 kg)  10/30/18 176 lb (79.8 kg)   Body mass index is 24.39 kg/m.   Physical Exam    Constitutional: Appears well-developed and well-nourished. No distress.  HENT:  Head: Normocephalic and atraumatic.  Neck: Neck supple. No tracheal deviation present. No thyromegaly present.  No cervical lymphadenopathy  Cardiovascular: Normal rate, regular rhythm and normal heart sounds.   No murmur heard. No carotid bruit .  No edema Pulmonary/Chest: Effort normal and breath sounds normal. No respiratory distress. No has no wheezes. No rales.  Skin: Skin is warm and dry. Not diaphoretic.  Psychiatric: Normal mood and affect. Behavior is normal.       Assessment & Plan:    See Problem  List for Assessment and Plan of chronic medical problems.

## 2019-09-13 ENCOUNTER — Encounter: Payer: Self-pay | Admitting: Internal Medicine

## 2019-09-13 ENCOUNTER — Other Ambulatory Visit: Payer: Self-pay

## 2019-09-13 ENCOUNTER — Ambulatory Visit (INDEPENDENT_AMBULATORY_CARE_PROVIDER_SITE_OTHER): Payer: Medicare Other | Admitting: Internal Medicine

## 2019-09-13 VITALS — BP 140/72 | HR 72 | Resp 16 | Ht 70.0 in | Wt 170.0 lb

## 2019-09-13 DIAGNOSIS — K219 Gastro-esophageal reflux disease without esophagitis: Secondary | ICD-10-CM | POA: Diagnosis not present

## 2019-09-13 DIAGNOSIS — R0600 Dyspnea, unspecified: Secondary | ICD-10-CM | POA: Diagnosis not present

## 2019-09-13 DIAGNOSIS — R0609 Other forms of dyspnea: Secondary | ICD-10-CM

## 2019-09-13 MED ORDER — FAMOTIDINE 20 MG PO TABS: 20.0000 mg | ORAL_TABLET | Freq: Every day | ORAL | 5 refills | Status: DC

## 2019-09-13 NOTE — Assessment & Plan Note (Signed)
He exercises regularly and there has been no changes in his activity level He has noticed some increased dyspnea on exertion when going up an incline when he is walking or biking Also having some atypical reflux symptoms Concern for angina Will refer to cardiology for further evaluation

## 2019-09-13 NOTE — Assessment & Plan Note (Signed)
He does have a hiatal hernia, which could be causing some of his symptoms He also has had his esophagus dilated in 2009 Seems to be having some reflux we will go ahead and treat that-Start Pepcid 20 mg at bedtime Some of his reflux symptoms are somewhat atypical and are concerning for atypical angina as well-refer to cardiology

## 2019-09-13 NOTE — Patient Instructions (Addendum)
   Medications reviewed and updated.  Changes include :   Start pepcid 20 mg at bedtime or in the evening.     A referral was ordered for cardiology   If your symptoms worsen before you see cardiology please call.

## 2019-09-27 NOTE — Progress Notes (Signed)
Cardiology Office Note:   Date:  09/28/2019  NAME:  Mark Copeland    MRN: [id number] DOB:  08-22-1934   PCP:  Binnie Rail, MD  Cardiologist:  Evalina Field, MD   Referring MD: Binnie Rail, MD   Chief Complaint  Patient presents with   Shortness of Breath   History of Present Illness:   Mark Copeland is a 85 y.o. male with a hx of hyperlipidemia, hypertension, GERD who is being seen today for the evaluation of dyspnea on exertion at the request of Quay Burow, Claudina Lick, MD.  He reports he is an extremely active gentleman.  He walks 2 miles daily and bikes 6 miles 2 days/week.  He reports no symptoms of angina or chest pain when doing this level of activity.  He reports he is maintained this level activity for a number of years.  He reports for the past 6 months he is noticed he is a little more short of breath when starting his exercise.  He reports that the shortness of breath is mainly increased work of breathing and this resolves after several minutes of getting into his workout.  He reports that it goes away he is able to complete the level activity that was mentioned above.  He describes no symptoms of lower extreme edema, orthopnea, PND.  He gets no chest pain or chest pressure at rest.  Overall, he is a very healthy gentleman who is just had a little bit of increased work of breathing at the beginning of his exercises.  Review of laboratory data from his primary care physician show an A1c of 5.9 and LDL cholesterol 96.  He does have what he describes as white coat hypertension, with blood pressure a bit elevated in the doctor's office.  He reports his wife is a retired Marine scientist who checks his blood pressure regularly and it is within normal range.  He does take blood pressure medication.  He reports that his family history is significant for father who had heart disease as well as mother however they were were heavy smokers and drinkers.  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   He has 1 son and 1 granddaughter.   Past Medical History: Past Medical History:  Diagnosis Date   BPH (benign prostatic hypertrophy)    Colonic polyp 2011   Dr.John Hayes   Fasting hyperglycemia    Hiatal hernia 2012   Hyperlipidemia    Interstitial cystitis    presentation in 1997 w/ abdominal pain/distention, Dr. Lawrence Santiago, [facility]   Nephrolithiasis    x 2   Skin cancer    basal cell, Dr.Dan Ronnald Ramp    Past Surgical History: Past Surgical History:  Procedure Laterality Date   COLONOSCOPY W/ POLYPECTOMY     and esophageal dilation 01/2008 by Dr.Hayes; bladder stones s/p urethral dilation 10/2007 by Dr. Amalia Hailey   CYSTOSCOPY     2004/2011; Hospitalized 1997 w/ I.C.   LAPAROSCOPIC CHOLECYSTECTOMY  07/14/11   SEPTOPLASTY     TONSILLECTOMY     UPPER GASTROINTESTINAL ENDOSCOPY  2012   Dr Teena Irani; hiatal hernia    Current Medications: Current Meds  Medication Sig   amLODipine (NORVASC) 5 MG tablet Take 1 tablet (5 mg total) by mouth daily.   famotidine (PEPCID) 20 MG tablet Take 1 tablet (20 mg total) by mouth at bedtime.   fexofenadine (ALLEGRA) 180 MG tablet Take 180 mg by mouth daily as needed (allergies).   fluticasone (FLONASE) 50 MCG/ACT  nasal spray Place 2 sprays into the nose 2 (two) times daily as needed for rhinitis or allergies.   telmisartan-hydrochlorothiazide (MICARDIS HCT) 40-12.5 MG tablet Take 1 tablet by mouth daily.     Allergies:    Nitrofurantoin, Penicillins, Sulfonamide derivatives, Amlodipine, Shellfish allergy, and Tamsulosin   Social History: Social History   Socioeconomic History   Marital status: Married    Spouse name: Not on file   Number of children: 1   Years of education: Not on file   Highest education level: Not on file  Occupational History   Not on file  Social Needs   Financial resource strain: Not on file   Food insecurity    Worry: Not on file    Inability: Not on file   Transportation needs     Medical: Not on file    Non-medical: Not on file  Tobacco Use   Smoking status: Former Smoker    Quit date: 08/04/1960    Years since quitting: 59.1   Smokeless tobacco: Never Used   Tobacco comment: smoked 1958-1961, up to 2 cigars / day  Substance and Sexual Activity   Alcohol use: Yes    Comment:  occasional wine   Drug use: No   Sexual activity: Not Currently  Lifestyle   Physical activity    Days per week: Not on file    Minutes per session: Not on file   Stress: Not on file  Relationships   Social connections    Talks on phone: Not on file    Gets together: Not on file    Attends religious service: Not on file    Active member of club or organization: Not on file    Attends meetings of clubs or organizations: Not on file    Relationship status: Not on file  Other Topics Concern   Not on file  Social History Narrative   Retired Electrical engineer        Family History: The patient's family history includes Coronary artery disease in his father; Hypertension in his brother and father; Stroke (age of onset: 58) in his mother; Transient ischemic attack (age of onset: 1) in his father. There is no history of Diabetes or Cancer.  ROS:   All other ROS reviewed and negative. Pertinent positives noted in the HPI.     EKGs/Labs/Other Studies Reviewed:   The following studies were personally reviewed by me today:  EKG:  EKG is ordered today.  The ekg ordered today demonstrates normal sinus rhythm, rate 67, normal intervals, no infarction, no acute ischemic changes, and was personally reviewed by me.   Recent Labs: 05/21/2019: ALT 12; BUN 18; Creatinine, Ser 1.10; Hemoglobin 16.0; Platelets 319.0; Potassium 4.2; Sodium 140; TSH 1.27   Recent Lipid Panel    Component Value Date/Time   CHOL 163 05/21/2019 0909   TRIG 77.0 05/21/2019 0909   HDL 53.10 05/21/2019 0909   CHOLHDL 3 05/21/2019 0909   VLDL 15.4 05/21/2019 0909   LDLCALC 94 05/21/2019 0909     Physical Exam:   VS:  BP (!) 165/87    Pulse 75    Temp (!) 97.3 F (36.3 C)    Ht 5\' 10"  (1.778 m)    Wt 169 lb (76.7 kg)    SpO2 97%    BMI 24.25 kg/m    Wt Readings from Last 3 Encounters:  09/28/19 169 lb (76.7 kg)  09/13/19 170 lb (77.1 kg)  05/21/19 164 lb (74.4 kg)  General: Well nourished, well developed, in no acute distress Heart: Atraumatic, normal size  Eyes: PEERLA, EOMI  Neck: Supple, no JVD Endocrine: No thryomegaly Cardiac: Normal S1, S2; RRR; no murmurs, rubs, or gallops Lungs: Clear to auscultation bilaterally, no wheezing, rhonchi or rales  Abd: Soft, nontender, no hepatomegaly  Ext: No edema, pulses 2+ Musculoskeletal: No deformities, BUE and BLE strength normal and equal Skin: Warm and dry, no rashes   Neuro: Alert and oriented to person, place, time, and situation, CNII-XII grossly intact, no focal deficits  Psych: Normal mood and affect   ASSESSMENT:   Mark Copeland is a 85 y.o. male who presents for the following: 1. Dyspnea on exertion   2. Essential hypertension   3. Mixed hyperlipidemia     PLAN:   1. Dyspnea on exertion -He appears to get increased work of breathing when starting his exercise.  His EKG is extremely normal and there is no evidence of acute ischemic changes or prior infarction.  His cardiac examination on auscultation is very benign and there is no evidence of murmur or rubs.  He has no evidence of increased volume on his examination.  His blood pressure is a bit elevated but he reports he has whitecoat hypertension.  I discussed with him that is not unreasonable pursue an exercise treadmill stress test and will do so.  We will see him back in 3 months for virtual visit just to ensure everything is okay.  I will notify him by phone if his exercise treadmill is abnormal.  2. Essential hypertension -Continue current medications.  Blood pressure noted to be elevated today however he reports whitecoat hypertension  3. Mixed  hyperlipidemia -LDL cholesterol is at goal   Disposition: Return in about 3 months (around 12/29/2019).  Medication Adjustments/Labs and Tests Ordered: Current medicines are reviewed at length with the patient today.  Concerns regarding medicines are outlined above.  Orders Placed This Encounter  Procedures   EXERCISE TOLERANCE TEST (ETT)   EKG 12-Lead   No orders of the defined types were placed in this encounter.   Patient Instructions  Medication Instructions:  The current medical regimen is effective;  continue present plan and medications.  *If you need a refill on your cardiac medications before your next appointment, please call your pharmacy*  Testing/Procedures: Your physician has requested that you have an exercise tolerance test, this is a screening tool to track your fitness level. This test evaluates the your exercise capacity by measuring cardiovascular response to exercise, the stress response is induced by exercise (exercise-treadmill).  Graded exercise test is also known as maximal exercise test or stress EKG test  .   Follow-Up: At [facility], you and your health needs are our priority.  As part of our continuing mission to provide you with exceptional heart care, we have created designated Provider Care Teams.  These Care Teams include your primary Cardiologist (physician) and Advanced Practice Providers (APPs -  Physician Assistants and Nurse Practitioners) who all work together to provide you with the care you need, when you need it.  Your next appointment:   3 months  The format for your next appointment:   Virtual Visit   Provider:   Eleonore Chiquito, MD      Signed, Addison Naegeli. Audie Box, [place]  [address], [place], [place] [address] [phone]  09/28/2019 10:21 AM

## 2019-09-28 ENCOUNTER — Ambulatory Visit: Payer: Medicare Other | Admitting: Cardiovascular Disease

## 2019-09-28 ENCOUNTER — Encounter: Payer: Self-pay | Admitting: Cardiovascular Disease

## 2019-09-28 ENCOUNTER — Other Ambulatory Visit: Payer: Self-pay

## 2019-09-28 VITALS — BP 165/87 | HR 75 | Temp 97.3°F | Ht 70.0 in | Wt 169.0 lb

## 2019-09-28 DIAGNOSIS — R0609 Other forms of dyspnea: Secondary | ICD-10-CM

## 2019-09-28 DIAGNOSIS — R0600 Dyspnea, unspecified: Secondary | ICD-10-CM

## 2019-09-28 DIAGNOSIS — E782 Mixed hyperlipidemia: Secondary | ICD-10-CM | POA: Diagnosis not present

## 2019-09-28 DIAGNOSIS — I10 Essential (primary) hypertension: Secondary | ICD-10-CM

## 2019-09-28 NOTE — Patient Instructions (Signed)
Medication Instructions:  The current medical regimen is effective;  continue present plan and medications.  *If you need a refill on your cardiac medications before your next appointment, please call your pharmacy*  Testing/Procedures: Your physician has requested that you have an exercise tolerance test, this is a screening tool to track your fitness level. This test evaluates the your exercise capacity by measuring cardiovascular response to exercise, the stress response is induced by exercise (exercise-treadmill).  Graded exercise test is also known as maximal exercise test or stress EKG test  .   Follow-Up: At [facility], you and your health needs are our priority.  As part of our continuing mission to provide you with exceptional heart care, we have created designated Provider Care Teams.  These Care Teams include your primary Cardiologist (physician) and Advanced Practice Providers (APPs -  Physician Assistants and Nurse Practitioners) who all work together to provide you with the care you need, when you need it.  Your next appointment:   3 months  The format for your next appointment:   Virtual Visit   Provider:   Eleonore Chiquito, MD

## 2019-10-16 ENCOUNTER — Other Ambulatory Visit (HOSPITAL_COMMUNITY): Payer: Medicare Other

## 2019-10-18 ENCOUNTER — Other Ambulatory Visit: Payer: Self-pay

## 2019-10-18 ENCOUNTER — Encounter: Payer: Self-pay | Admitting: Internal Medicine

## 2019-10-18 ENCOUNTER — Ambulatory Visit (INDEPENDENT_AMBULATORY_CARE_PROVIDER_SITE_OTHER): Payer: Medicare Other | Admitting: Internal Medicine

## 2019-10-18 DIAGNOSIS — K219 Gastro-esophageal reflux disease without esophagitis: Secondary | ICD-10-CM

## 2019-10-18 DIAGNOSIS — R634 Abnormal weight loss: Secondary | ICD-10-CM

## 2019-10-18 MED ORDER — FAMOTIDINE 20 MG PO TABS: ORAL_TABLET | ORAL | 5 refills | Status: DC

## 2019-10-18 NOTE — Patient Instructions (Signed)
Take pepcid twice daily for one week and then nightly.   Monitor your weight at home over the week.     Update me in one week with your symptoms and weight at home.

## 2019-10-18 NOTE — Assessment & Plan Note (Addendum)
I think his symptoms he is experiencing with the waves of nausea is probably atypical GERD Advised him to take Pepcid twice daily for 1 week and then nightly If his symptoms do not improve advised that he needs to call me and let me know-may need further evaluation Pepcid prescription sent to pharmacy

## 2019-10-18 NOTE — Progress Notes (Signed)
Subjective:    Patient ID: Mark Copeland, male    DOB: 29-Dec-1933, 85 y.o.   MRN: [id number]  HPI The patient is here for an acute visit.   He is having his sensation of throwing up/nausea that started about one week ago.  It does not matter if it is day or night. It comes in waves and lasts 5-10 minutes.  Last night he had it 3 times.  He has had it two times today already.  He denies any heartburn.  In the past I did prescribe Pepcid for him to take nightly.  He states he is taking the as needed only.  He has not taken in a while because he has not needed it.  He was not sure if it was related to his blood pressure, stomach, anxiety.  He did have some Valium at home and took 2 mg, but it did not do anything.   His BP has been good at home - 160/78 yesterday after an episode, but he did feel worked up after the episode.  He typically gets blood pressures around 142/76.       He was very concerned when he got on the scale at home today because his weight was down about 10 pounds.  He denies any major changes in his exercise routine or diet that would have caused that.  He has not noticed any change in the way his clothes fit.  He does weigh himself consistently.  Our scale here did not detect much of a difference from his previous weight, but he was fully closed and had a coat, etc. On.    Medications and allergies reviewed with patient and updated if appropriate.  Patient Active Problem List   Diagnosis Date Noted  . DOE (dyspnea on exertion) 05/21/2019  . Anxiety and depression 10/30/2018  . Bilateral sensorineural hearing loss 06/25/2018  . Otalgia of both ears 06/18/2018  . Dizziness 06/18/2018  . Macular degeneration 04/27/2018  . Mild aortic regurgitation 04/27/2018  . Radiculopathy of leg 07/09/2017  . 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 Bilateral temporomandibular joint pain 04/25/2017  . Hiatal hernia, small 04/23/2016  . Erectile dysfunction 05/06/2012  . Bladder  outlet obstruction 05/06/2012  . Hypertension 11/13/2011  . Abnormal chest x-ray 11/13/2011  . Constipation 11/02/2010  . NEPHROLITHIASIS, HX OF 10/14/2010  . Prediabetes 10/11/2010  . SKIN CANCER, HX OF 10/11/2010  . INTENTION TREMOR 09/15/2009  . BPH (benign prostatic hyperplasia) 09/15/2009  . History of colonic polyps 12/05/2008  . Hyperlipidemia 02/18/2008  . Esophageal reflux 02/18/2008  . INTERSTITIAL CYSTITIS 02/18/2008    Current Outpatient Medications on File Prior to Visit  Medication Sig Dispense Refill  . amLODipine (NORVASC) 5 MG tablet Take 1 tablet (5 mg total) by mouth daily. 90 tablet 1  . fexofenadine (ALLEGRA) 180 MG tablet Take 180 mg by mouth daily as needed (allergies).    . fluticasone (FLONASE) 50 MCG/ACT nasal spray Place 2 sprays into the nose 2 (two) times daily as needed for rhinitis or allergies.    Marland Kitchen telmisartan-hydrochlorothiazide (MICARDIS HCT) 40-12.5 MG tablet Take 1 tablet by mouth daily. 90 tablet 1   No current facility-administered medications on file prior to visit.     Past Medical History:  Diagnosis Date  . BPH (benign prostatic hypertrophy)   . Colonic polyp 2011   Dr.John Amedeo Plenty  . Fasting hyperglycemia   . Hiatal hernia 2012  . Hyperlipidemia   . Interstitial cystitis  presentation in 1997 w/ abdominal pain/distention, Dr. Lawrence Santiago, [facility]  . Nephrolithiasis    x 2  . Skin cancer    basal cell, Dr.Dan Ronnald Ramp    Past Surgical History:  Procedure Laterality Date  . COLONOSCOPY W/ POLYPECTOMY     and esophageal dilation 01/2008 by Dr.Hayes; bladder stones s/p urethral dilation 10/2007 by Dr. Amalia Hailey  . CYSTOSCOPY     2004/2011; Hospitalized 1997 w/ I.C.  . LAPAROSCOPIC CHOLECYSTECTOMY  07/14/11  . SEPTOPLASTY    . TONSILLECTOMY    . UPPER GASTROINTESTINAL ENDOSCOPY  2012   Dr Teena Irani; hiatal hernia    Social History   Socioeconomic History  . Marital status: Married    Spouse name: Not on file  . Number of children: 1   . Years of education: Not on file  . Highest education level: Not on file  Occupational History  . Not on file  Social Needs  . Financial resource strain: Not on file  . Food insecurity    Worry: Not on file    Inability: Not on file  . Transportation needs    Medical: Not on file    Non-medical: Not on file  Tobacco Use  . Smoking status: Former Smoker    Quit date: 08/04/1960    Years since quitting: 59.2  . Smokeless tobacco: Never Used  . Tobacco comment: smoked 1958-1961, up to 2 cigars / day  Substance and Sexual Activity  . Alcohol use: Yes    Comment:  occasional wine  . Drug use: No  . Sexual activity: Not Currently  Lifestyle  . Physical activity    Days per week: Not on file    Minutes per session: Not on file  . Stress: Not on file  Relationships  . Social Herbalist on phone: Not on file    Gets together: Not on file    Attends religious service: Not on file    Active member of club or organization: Not on file    Attends meetings of clubs or organizations: Not on file    Relationship status: Not on file  Other Topics Concern  . Not on file  Social History Narrative   Retired Electrical engineer       Family History  Problem Relation Age of Onset  . Transient ischemic attack Father 69  . Hypertension Father   . Coronary artery disease Father   . Stroke Mother 39  . Hypertension Brother   . Diabetes Neg Hx   . Cancer Neg Hx     Review of Systems  Constitutional: Negative for fever.  Respiratory: Negative for cough, shortness of breath and wheezing.   Cardiovascular: Negative for chest pain and palpitations.  Gastrointestinal: Positive for nausea. Negative for abdominal pain and vomiting.       No burning sensation in esophagus       Objective:   Vitals:   10/18/19 1420  BP: (!) 162/84  Pulse: 60  Resp: 16  Temp: 98.1 F (36.7 C)  SpO2: 97%   BP Readings from Last 3 Encounters:  10/18/19 (!) 162/84  09/28/19 (!) 165/87   09/13/19 140/72   Wt Readings from Last 3 Encounters:  10/18/19 171 lb (77.6 kg)  09/28/19 169 lb (76.7 kg)  09/13/19 170 lb (77.1 kg)   Body mass index is 24.54 kg/m.   Physical Exam    Constitutional: Appears well-developed and well-nourished. No distress.  HENT:  Head: Normocephalic and atraumatic.  Neck: Neck supple. No tracheal deviation present. No thyromegaly present.  No cervical lymphadenopathy Cardiovascular: Normal rate, regular rhythm and normal heart sounds.   No murmur heard. No carotid bruit .  No edema Pulmonary/Chest: Effort normal and breath sounds normal. No respiratory distress. No has no wheezes. No rales.  Abdomen: Soft, nontender, nondistended Skin: Skin is warm and dry. Not diaphoretic.  Psychiatric: Normal mood and affect. Behavior is normal.       Assessment & Plan:    See Problem List for Assessment and Plan of chronic medical problems.     This visit occurred during the SARS-CoV-2 public health emergency.  Safety protocols were in place, including screening questions prior to the visit, additional usage of staff PPE, and extensive cleaning of exam room while observing appropriate contact time as indicated for disinfecting solutions.

## 2019-10-18 NOTE — Assessment & Plan Note (Signed)
When he weighed himself on his home scale this morning it looked like he had lost 10 pounds, but the scales here showed no weight loss Of course here he was wearing coat, extra close because it is cold outside and that could be contributing ?  Weight loss Advised him to monitor his weight over the next week at home and let me know if they confirm that he has lost weight

## 2019-10-20 ENCOUNTER — Encounter (HOSPITAL_COMMUNITY): Payer: Medicare Other

## 2019-10-23 ENCOUNTER — Encounter: Payer: Self-pay | Admitting: Internal Medicine

## 2019-11-02 ENCOUNTER — Other Ambulatory Visit: Payer: Self-pay

## 2019-11-02 MED ORDER — FAMOTIDINE 20 MG PO TABS: ORAL_TABLET | ORAL | 1 refills | Status: DC

## 2019-11-22 ENCOUNTER — Ambulatory Visit: Payer: Medicare Other | Attending: Internal Medicine

## 2019-11-22 DIAGNOSIS — Z23 Encounter for immunization: Secondary | ICD-10-CM | POA: Insufficient documentation

## 2019-11-22 NOTE — Progress Notes (Signed)
   Covid-19 Vaccination Clinic  Name:  Mark Copeland    MRN: [id number] DOB: Feb 20, 1934  11/22/2019  Mark Copeland was observed post Covid-19 immunization for 15 minutes without incidence. He was provided with Vaccine Information Sheet and instruction to access the V-Safe system.   Mark Copeland was instructed to call 911 with any severe reactions post vaccine: Marland Kitchen Difficulty breathing  . Swelling of your face and throat  . A fast heartbeat  . A bad rash all over your body  . Dizziness and weakness    Immunizations Administered    Name Date Dose VIS Date Route   Pfizer COVID-19 Vaccine 11/22/2019  8:42 AM 0.3 mL 10/22/2019 Intramuscular   Manufacturer: [place]   Lot: [id number]   [place]: [id number]

## 2019-11-24 ENCOUNTER — Other Ambulatory Visit: Payer: Self-pay | Admitting: Internal Medicine

## 2019-12-10 ENCOUNTER — Other Ambulatory Visit: Payer: Self-pay | Admitting: Internal Medicine

## 2019-12-12 ENCOUNTER — Ambulatory Visit: Payer: Medicare Other | Attending: Internal Medicine

## 2019-12-12 DIAGNOSIS — Z23 Encounter for immunization: Secondary | ICD-10-CM | POA: Insufficient documentation

## 2019-12-12 NOTE — Progress Notes (Signed)
   Covid-19 Vaccination Clinic  Name:  DAKING HEMSLEY    MRN: [id number] DOB: 1934/02/26  12/12/2019  Mr. Baumhardt was observed post Covid-19 immunization for 15 minutes without incidence. He was provided with Vaccine Information Sheet and instruction to access the V-Safe system.   Mr. Ricardez was instructed to call 911 with any severe reactions post vaccine: Marland Kitchen Difficulty breathing  . Swelling of your face and throat  . A fast heartbeat  . A bad rash all over your body  . Dizziness and weakness    Immunizations Administered    Name Date Dose VIS Date Route   Pfizer COVID-19 Vaccine 12/12/2019 11:08 AM 0.3 mL 10/22/2019 Intramuscular   Manufacturer: [place]   Lot: [id number]   [place]: [id number]

## 2019-12-29 ENCOUNTER — Ambulatory Visit: Payer: Medicare Other | Admitting: Cardiovascular Disease

## 2020-01-10 NOTE — Progress Notes (Signed)
Subjective:    Patient ID: Mark Copeland, male    DOB: 1934-11-10, 86 y.o.   MRN: [id number]  HPI The patient is here for an acute visit.   He just does not feel good.  He will break out in sweat, has some reflux and he feels nauseous.  Yesterday he had 4 episodes.  They can occur at rest or with activity.  They are not related to when he eats.  He is not hungry and he is making himself eat.  He wondered if some of his activities made his symptoms worse - he is very active.  He is walking regularly. He does weights.  He takes the pepcid daily.      Medications and allergies reviewed with patient and updated if appropriate.  Patient Active Problem List   Diagnosis Date Noted  . Weight loss 10/18/2019  . DOE (dyspnea on exertion) 05/21/2019  . Anxiety and depression 10/30/2018  . Bilateral sensorineural hearing loss 06/25/2018  . Otalgia of both ears 06/18/2018  . Dizziness 06/18/2018  . Macular degeneration 04/27/2018  . Mild aortic regurgitation 04/27/2018  . Radiculopathy of leg 07/09/2017  . 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 Bilateral temporomandibular joint pain 04/25/2017  . Hiatal hernia, small 04/23/2016  . Erectile dysfunction 05/06/2012  . Bladder outlet obstruction 05/06/2012  . Hypertension 11/13/2011  . Abnormal chest x-ray 11/13/2011  . Constipation 11/02/2010  . NEPHROLITHIASIS, HX OF 10/14/2010  . Prediabetes 10/11/2010  . SKIN CANCER, HX OF 10/11/2010  . INTENTION TREMOR 09/15/2009  . BPH (benign prostatic hyperplasia) 09/15/2009  . History of colonic polyps 12/05/2008  . Hyperlipidemia 02/18/2008  . Esophageal reflux 02/18/2008  . INTERSTITIAL CYSTITIS 02/18/2008    Current Outpatient Medications on File Prior to Visit  Medication Sig Dispense Refill  . amLODipine (NORVASC) 5 MG tablet Take 1 tablet (5 mg total) by mouth daily. 90 tablet 1  . famotidine (PEPCID) 20 MG tablet Take 1 tablet by mouth nightly. 90 tablet 1  . fexofenadine (ALLEGRA)  180 MG tablet Take 180 mg by mouth daily as needed (allergies).    . fluticasone (FLONASE) 50 MCG/ACT nasal spray Place 2 sprays into the nose 2 (two) times daily as needed for rhinitis or allergies.    Marland Kitchen telmisartan-hydrochlorothiazide (MICARDIS HCT) 40-12.5 MG tablet TAKE 1 TABLET BY MOUTH  DAILY 90 tablet 3   No current facility-administered medications on file prior to visit.    Past Medical History:  Diagnosis Date  . BPH (benign prostatic hypertrophy)   . Colonic polyp 2011   Dr.John Amedeo Plenty  . Fasting hyperglycemia   . Hiatal hernia 2012  . Hyperlipidemia   . Interstitial cystitis    presentation in 1997 w/ abdominal pain/distention, Dr. Lawrence Santiago, [facility]  . Nephrolithiasis    x 2  . Skin cancer    basal cell, Dr.Dan Ronnald Ramp    Past Surgical History:  Procedure Laterality Date  . COLONOSCOPY W/ POLYPECTOMY     and esophageal dilation 01/2008 by Dr.Hayes; bladder stones s/p urethral dilation 10/2007 by Dr. Amalia Hailey  . CYSTOSCOPY     2004/2011; Hospitalized 1997 w/ I.C.  . LAPAROSCOPIC CHOLECYSTECTOMY  07/14/11  . SEPTOPLASTY    . TONSILLECTOMY    . UPPER GASTROINTESTINAL ENDOSCOPY  2012   Dr Teena Irani; hiatal hernia    Social History   Socioeconomic History  . Marital status: Married    Spouse name: Not on file  . Number of children: 1  . Years  of education: Not on file  . Highest education level: Not on file  Occupational History  . Not on file  Tobacco Use  . Smoking status: Former Smoker    Quit date: 08/04/1960    Years since quitting: 59.4  . Smokeless tobacco: Never Used  . Tobacco comment: smoked 1958-1961, up to 2 cigars / day  Substance and Sexual Activity  . Alcohol use: Yes    Comment:  occasional wine  . Drug use: No  . Sexual activity: Not Currently  Other Topics Concern  . Not on file  Social History Narrative   Retired Electrical engineer      Social Determinants of Health   Financial Resource Strain:   . Difficulty of Paying Living Expenses:  Not on file  Food Insecurity:   . Worried About Charity fundraiser in the Last Year: Not on file  . Ran Out of Food in the Last Year: Not on file  Transportation Needs:   . Lack of Transportation (Medical): Not on file  . Lack of Transportation (Non-Medical): Not on file  Physical Activity:   . Days of Exercise per Week: Not on file  . Minutes of Exercise per Session: Not on file  Stress:   . Feeling of Stress : Not on file  Social Connections:   . Frequency of Communication with Friends and Family: Not on file  . Frequency of Social Gatherings with Friends and Family: Not on file  . Attends Religious Services: Not on file  . Active Member of Clubs or Organizations: Not on file  . Attends Archivist Meetings: Not on file  . Marital Status: Not on file    Family History  Problem Relation Age of Onset  . Transient ischemic attack Father 70  . Hypertension Father   . Coronary artery disease Father   . Stroke Mother 38  . Hypertension Brother   . Diabetes Neg Hx   . Cancer Neg Hx     Review of Systems  Constitutional: Positive for appetite change (decreased). Negative for fever.  HENT: Negative for trouble swallowing.   Respiratory: Negative for shortness of breath.   Cardiovascular: Negative for chest pain and palpitations.  Gastrointestinal: Positive for constipation ( little recently - controlled) and nausea. Negative for abdominal pain, blood in stool (no black stool), diarrhea and vomiting.       Reflux       Objective:   Vitals:   01/11/20 1325  BP: (!) 150/74  Pulse: 71  Resp: 16  Temp: 98.1 F (36.7 C)  SpO2: 97%   BP Readings from Last 3 Encounters:  01/11/20 (!) 150/74  10/18/19 (!) 162/84  09/28/19 (!) 165/87   Wt Readings from Last 3 Encounters:  01/11/20 171 lb (77.6 kg)  10/18/19 171 lb (77.6 kg)  09/28/19 169 lb (76.7 kg)   Body mass index is 24.54 kg/m.   Physical Exam    Constitutional: Appears well-developed and  well-nourished. No distress.  Head: Normocephalic and atraumatic.  Neck: Neck supple. No tracheal deviation present. No thyromegaly present.  No cervical lymphadenopathy Cardiovascular: Normal rate, regular rhythm and normal heart sounds.  No murmur heard. No carotid bruit .  No edema Pulmonary/Chest: Effort normal and breath sounds normal. No respiratory distress. No has no wheezes. No rales.  Abdomen: soft, NT, ND Skin: Skin is warm and dry. Not diaphoretic.  Psychiatric: Normal mood and affect. Behavior is normal.       Assessment & Plan:  See Problem List for Assessment and Plan of chronic medical problems.     This visit occurred during the SARS-CoV-2 public health emergency.  Safety protocols were in place, including screening questions prior to the visit, additional usage of staff PPE, and extensive cleaning of exam room while observing appropriate contact time as indicated for disinfecting solutions.

## 2020-01-11 ENCOUNTER — Other Ambulatory Visit: Payer: Self-pay

## 2020-01-11 ENCOUNTER — Encounter: Payer: Self-pay | Admitting: Internal Medicine

## 2020-01-11 ENCOUNTER — Ambulatory Visit (INDEPENDENT_AMBULATORY_CARE_PROVIDER_SITE_OTHER): Payer: Medicare Other | Admitting: Internal Medicine

## 2020-01-11 VITALS — BP 150/74 | HR 71 | Temp 98.1°F | Resp 16 | Ht 70.0 in | Wt 171.0 lb

## 2020-01-11 DIAGNOSIS — K219 Gastro-esophageal reflux disease without esophagitis: Secondary | ICD-10-CM | POA: Diagnosis not present

## 2020-01-11 MED ORDER — OMEPRAZOLE 40 MG PO CPDR: 40.0000 mg | DELAYED_RELEASE_CAPSULE | Freq: Every day | ORAL | 5 refills | Status: DC

## 2020-01-11 NOTE — Assessment & Plan Note (Addendum)
Acute on chronic Not ideally controlled I think his current symptoms are likely GERD, esophageal spasms less likely Cardiac cause not likely - he did see cardio last year Hiatal hernia likely contributing Not likely related to what he is eating or activity Stop pepcid, start omeprazole 40 mg  Continue prilosec x at least 4 weeks - in near future if this improves and controls his symptoms will try to decrease dose Continue GERD diet and regular activity If no improvement will refer to GI

## 2020-01-11 NOTE — Patient Instructions (Addendum)
Stop famotidine 20 mg daily.  Start omeprazole 40 mg daily - take 30 minutes prior to a meal.    Please call if there is no improvement in your symptoms and we can consider having you see GI.     Gastroesophageal Reflux Disease, Adult Gastroesophageal reflux (GER) happens when acid from the stomach flows up into the tube that connects the mouth and the stomach (esophagus). Normally, food travels down the esophagus and stays in the stomach to be digested. However, when a person has GER, food and stomach acid sometimes move back up into the esophagus. If this becomes a more serious problem, the person may be diagnosed with a disease called gastroesophageal reflux disease (GERD). GERD occurs when the reflux:  Happens often.  Causes frequent or severe symptoms.  Causes problems such as damage to the esophagus. When stomach acid comes in contact with the esophagus, the acid may cause soreness (inflammation) in the esophagus. Over time, GERD may create small holes (ulcers) in the lining of the esophagus. What are the causes? This condition is caused by a problem with the muscle between the esophagus and the stomach (lower esophageal sphincter, or LES). Normally, the LES muscle closes after food passes through the esophagus to the stomach. When the LES is weakened or abnormal, it does not close properly, and that allows food and stomach acid to go back up into the esophagus. The LES can be weakened by certain dietary substances, medicines, and medical conditions, including:  Tobacco use.  Pregnancy.  Having a hiatal hernia.  Alcohol use.  Certain foods and beverages, such as coffee, chocolate, onions, and peppermint. What increases the risk? You are more likely to develop this condition if you:  Have an increased body weight.  Have a connective tissue disorder.  Use NSAID medicines. What are the signs or symptoms? Symptoms of this condition include:  Heartburn.  Difficult or  painful swallowing.  The feeling of having a lump in the throat.  Abitter taste in the mouth.  Bad breath.  Having a large amount of saliva.  Having an upset or bloated stomach.  Belching.  Chest pain. Different conditions can cause chest pain. Make sure you see your health care provider if you experience chest pain.  Shortness of breath or wheezing.  Ongoing (chronic) cough or a night-time cough.  Wearing away of tooth enamel.  Weight loss. How is this diagnosed? Your health care provider will take a medical history and perform a physical exam. To determine if you have mild or severe GERD, your health care provider may also monitor how you respond to treatment. You may also have tests, including:  A test to examine your stomach and esophagus with a small camera (endoscopy).  A test thatmeasures the acidity level in your esophagus.  A test thatmeasures how much pressure is on your esophagus.  A barium swallow or modified barium swallow test to show the shape, size, and functioning of your esophagus. How is this treated? The goal of treatment is to help relieve your symptoms and to prevent complications. Treatment for this condition may vary depending on how severe your symptoms are. Your health care provider may recommend:  Changes to your diet.  Medicine.  Surgery. Follow these instructions at home: Eating and drinking   Follow a diet as recommended by your health care provider. This may involve avoiding foods and drinks such as: ? Coffee and tea (with or without caffeine). ? Drinks that containalcohol. ? Energy drinks and sports  drinks. ? Carbonated drinks or sodas. ? Chocolate and cocoa. ? Peppermint and mint flavorings. ? Garlic and onions. ? Horseradish. ? Spicy and acidic foods, including peppers, chili powder, curry powder, vinegar, hot sauces, and barbecue sauce. ? Citrus fruit juices and citrus fruits, such as oranges, lemons, and  limes. ? Tomato-based foods, such as red sauce, chili, salsa, and pizza with red sauce. ? Fried and fatty foods, such as donuts, french fries, potato chips, and high-fat dressings. ? High-fat meats, such as hot dogs and fatty cuts of red and white meats, such as rib eye steak, sausage, ham, and bacon. ? High-fat dairy items, such as whole milk, butter, and cream cheese.  Eat small, frequent meals instead of large meals.  Avoid drinking large amounts of liquid with your meals.  Avoid eating meals during the 2-3 hours before bedtime.  Avoid lying down right after you eat.  Do not exercise right after you eat. Lifestyle   Do not use any products that contain nicotine or tobacco, such as cigarettes, e-cigarettes, and chewing tobacco. If you need help quitting, ask your health care provider.  Try to reduce your stress by using methods such as yoga or meditation. If you need help reducing stress, ask your health care provider.  If you are overweight, reduce your weight to an amount that is healthy for you. Ask your health care provider for guidance about a safe weight loss goal. General instructions  Pay attention to any changes in your symptoms.  Take over-the-counter and prescription medicines only as told by your health care provider. Do not take aspirin, ibuprofen, or other NSAIDs unless your health care provider told you to do so.  Wear loose-fitting clothing. Do not wear anything tight around your waist that causes pressure on your abdomen.  Raise (elevate) the head of your bed about 6 inches (15 cm).  Avoid bending over if this makes your symptoms worse.  Keep all follow-up visits as told by your health care provider. This is important. Contact a health care provider if:  You have: ? New symptoms. ? Unexplained weight loss. ? Difficulty swallowing or it hurts to swallow. ? Wheezing or a persistent cough. ? A hoarse voice.  Your symptoms do not improve with  treatment. Get help right away if you:  Have pain in your arms, neck, jaw, teeth, or back.  Feel sweaty, dizzy, or light-headed.  Have chest pain or shortness of breath.  Vomit and your vomit looks like blood or coffee grounds.  Faint.  Have stool that is bloody or black.  Cannot swallow, drink, or eat. Summary  Gastroesophageal reflux happens when acid from the stomach flows up into the esophagus. GERD is a disease in which the reflux happens often, causes frequent or severe symptoms, or causes problems such as damage to the esophagus.  Treatment for this condition may vary depending on how severe your symptoms are. Your health care provider may recommend diet and lifestyle changes, medicine, or surgery.  Contact a health care provider if you have new or worsening symptoms.  Take over-the-counter and prescription medicines only as told by your health care provider. Do not take aspirin, ibuprofen, or other NSAIDs unless your health care provider told you to do so.  Keep all follow-up visits as told by your health care provider. This is important. This information is not intended to replace advice given to you by your health care provider. Make sure you discuss any questions you have with your health care  provider. Document Revised: 05/06/2018 Document Reviewed: 05/06/2018 Elsevier Patient Education  [place].

## 2020-02-04 ENCOUNTER — Other Ambulatory Visit: Payer: Self-pay | Admitting: Internal Medicine

## 2020-02-04 DIAGNOSIS — I10 Essential (primary) hypertension: Secondary | ICD-10-CM

## 2020-02-19 IMAGING — CT CT ANGIO NECK
1 of 10 series · 5 of 33 positions shown · IV contrast (APPLIED)
Comparison: Prior MRI from 10/24/2011.

CLINICAL DATA: Initial evaluation for episodic vertigo.

EXAM:
CT ANGIOGRAPHY HEAD AND NECK
TECHNIQUE: Multidetector CT imaging of the head and neck was performed using
the standard protocol during bolus administration of intravenous
contrast. Multiplanar CT image reconstructions and MIPs were
obtained to evaluate the vascular anatomy. Carotid stenosis
measurements (when applicable) are obtained utilizing NASCET
criteria, using the distal internal carotid diameter as the
denominator.
CONTRAST:  100mL KQUPQV-SNP IOPAMIDOL (KQUPQV-SNP) INJECTION 76%

[Series 11: ax thin · axial · 0.39mm/px · z∈[-121,+114]mm · 5 of 356 slices shown]
[im 60/356  soft-tissue]
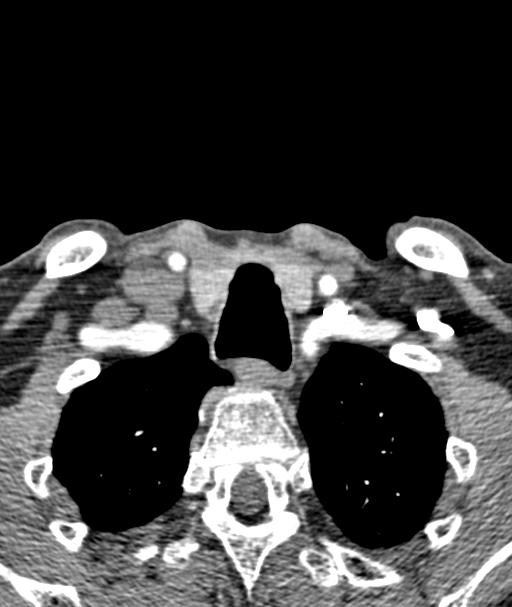
[im 119/356  bone]
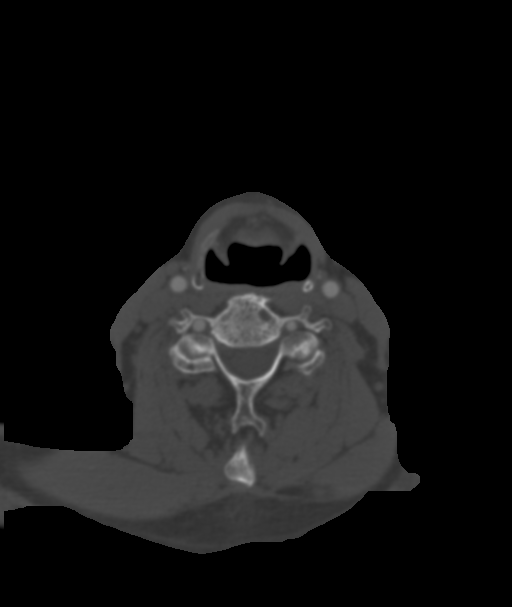
[im 178/356  soft-tissue]
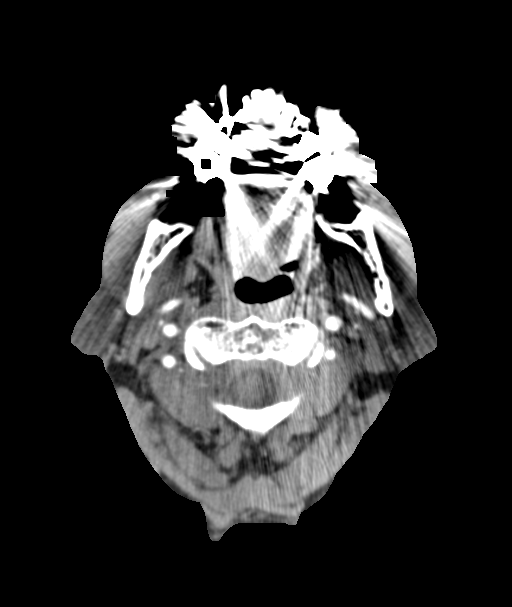
[im 237/356  bone]
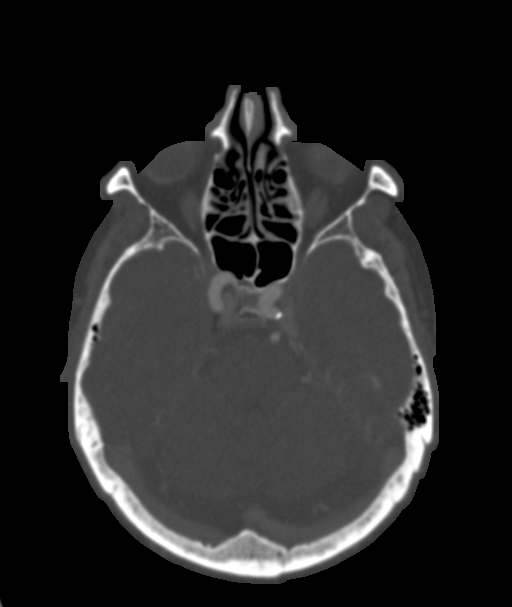
[im 296/356  soft-tissue]
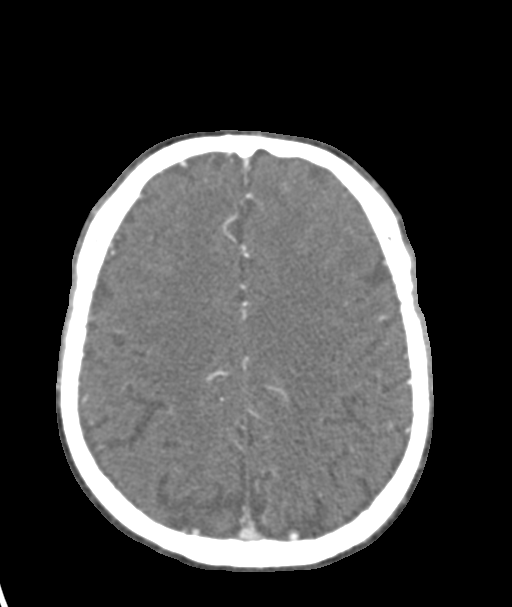

[5 of 33 positions shown; findings below may reference images not displayed]

FINDINGS: CT HEAD FINDINGS

Brain: Age-related cerebral atrophy. No acute intracranial
hemorrhage. No acute large vessel territory infarct. No mass lesion,
midline shift or mass effect. No hydrocephalus. No extra-axial fluid
collection.

Vascular: No hyperdense vessel. Scattered vascular calcifications
noted within the carotid siphons.

Skull: Scalp soft tissues within normal limits.  Calvarium intact.

Sinuses: Mild scattered mucosal thickening within the ethmoidal air
cells and maxillary sinuses. Paranasal sinuses are otherwise clear.
No mastoid effusion.

Orbits: Globes and orbital soft tissues demonstrate no acute
finding.

Review of the MIP images confirms the above findings

CTA NECK FINDINGS

Aortic arch: Visualized aortic arch of normal caliber with normal 3
vessel morphology. Mild atheromatous plaque within the proximal
descending intrathoracic aorta. No hemodynamically significant
stenosis about the origin of the great vessels. Visualized
subclavian arteries widely patent.

Right carotid system: Right common carotid artery widely patent from
its origin to the bifurcation without stenosis. Mild scattered
atheromatous plaque about the right bifurcation without stenosis.
Right ICA widely patent to the skull base without stenosis,
dissection, or occlusion.

Left carotid system: Left common carotid artery patent from its
origin to the bifurcation without stenosis. Minimal atheromatous
plaque about the left bifurcation without stenosis. Left ICA widely
patent to the skull base without stenosis, dissection, or occlusion.

Vertebral arteries: Both of the vertebral arteries arise from the
subclavian arteries. Mild focal plaque at the origin of the
vertebral arteries bilaterally without significant stenosis.
Vertebral arteries widely patent within the neck without stenosis,
dissection, or occlusion.

Skeleton: No acute osseous abnormality. Multiple scattered lucent
lesions seen within the cervical spine, likely degenerative in
nature, although myeloma can conceivably have this appearance as
well. Moderate cervical spondylolysis at C5-6.

Other neck: No acute soft tissue abnormality within the neck.
Thyroid normal. Salivary glands normal. No adenopathy.

Upper chest: Visualized upper chest demonstrates no acute finding.
Partially visualized lungs are grossly clear.

Review of the MIP images confirms the above findings

CTA HEAD FINDINGS

Anterior circulation: Petrous segments widely patent bilaterally.
Cavernous and supraclinoid right ICA widely patent to the terminus.
Mild plaque within the cavernous and supraclinoid right ICA with
relatively mild stenosis. ICA termini widely patent. A1 segments
widely patent. Normal anterior communicating artery. Anterior
cerebral arteries widely patent to their distal aspects without
stenosis. Widely patent M1 segments. Distal MCA branches well
perfused and symmetric.

Posterior circulation: Mild non stenotic plaque within the right V4
segment as it crosses into the cranial vault. Vertebral arteries
otherwise widely patent to the vertebrobasilar junction. Right
vertebral artery slightly dominant. Posterior inferior cerebral
arteries patent bilaterally. Basilar widely patent to its distal
aspect. Superior cerebral arteries patent bilaterally. Both of the
PCAs primarily supplied via the basilar. Basilar tip ectatic without
focal aneurysm. Tiny 2 mm outpouching at the origin of the left P1
segment, likely a small vascular infundibulum, relatively stable
from previous MRA. PCAs widely patent to their distal aspects.

Venous sinuses: Patent.

Anatomic variants: None significant.

Delayed phase: No abnormal enhancement.

Review of the MIP images confirms the above findings
IMPRESSION: 1. Negative CTA of the head and neck. Major arterial vasculature of
the head and neck is widely patent. No evidence for vertebrobasilar
insufficiency. Minor atherosclerotic change for patient age as
above.
2. 2 mm focal outpouching at the origin of the left P1 segment,
stable relative to previous MRA from 8338, most likely reflecting a
small vascular infundibulum.
3. Scattered osseous lucent lesions within the cervical spine, felt
to almost certainly be degenerative in nature, although myeloma
could also conceivably have this appearance. Correlation with
laboratory values suggested.

## 2020-03-02 DIAGNOSIS — D225 Melanocytic nevi of trunk: Secondary | ICD-10-CM | POA: Diagnosis not present

## 2020-03-02 DIAGNOSIS — L603 Nail dystrophy: Secondary | ICD-10-CM | POA: Diagnosis not present

## 2020-03-02 DIAGNOSIS — Z85828 Personal history of other malignant neoplasm of skin: Secondary | ICD-10-CM | POA: Diagnosis not present

## 2020-03-02 DIAGNOSIS — C44519 Basal cell carcinoma of skin of other part of trunk: Secondary | ICD-10-CM | POA: Diagnosis not present

## 2020-03-02 DIAGNOSIS — L570 Actinic keratosis: Secondary | ICD-10-CM | POA: Diagnosis not present

## 2020-03-02 DIAGNOSIS — C44319 Basal cell carcinoma of skin of other parts of face: Secondary | ICD-10-CM | POA: Diagnosis not present

## 2020-03-23 ENCOUNTER — Telehealth: Payer: Self-pay | Admitting: Internal Medicine

## 2020-03-23 DIAGNOSIS — N400 Enlarged prostate without lower urinary tract symptoms: Secondary | ICD-10-CM

## 2020-03-23 DIAGNOSIS — C44319 Basal cell carcinoma of skin of other parts of face: Secondary | ICD-10-CM | POA: Diagnosis not present

## 2020-03-23 NOTE — Progress Notes (Signed)
  Chronic Care Management   Note  03/23/2020 Name: Mark Copeland MRN: [id number] DOB: [birth year]  Mark Copeland is a 86 y.o. year old male who is a primary care patient of Mark Copeland, Mark Lick, Mark Copeland. I reached out to Mark Copeland by phone today in response to a referral sent by Mark Copeland PCP, Mark Rail, Mark Copeland.   Mark Copeland was given information about Chronic Care Management services today including:  1. CCM service includes personalized support from designated clinical staff supervised by his physician, including individualized plan of care and coordination with other care providers 2. 24/7 contact phone numbers for assistance for urgent and routine care needs. 3. Service will only be billed when office clinical staff spend 20 minutes or more in a month to coordinate care. 4. Only one practitioner may furnish and bill the service in a calendar month. 5. The patient may stop CCM services at any time (effective at the end of the month) by phone call to the office staff.   Patient agreed to services and verbal consent obtained.   This note is not being shared with the patient for the following reason: To respect privacy (The patient or proxy has requested that the information not be shared).  Follow up plan:   Mark Copeland

## 2020-04-03 DIAGNOSIS — H0102A Squamous blepharitis right eye, upper and lower eyelids: Secondary | ICD-10-CM | POA: Diagnosis not present

## 2020-04-03 DIAGNOSIS — H2513 Age-related nuclear cataract, bilateral: Secondary | ICD-10-CM | POA: Diagnosis not present

## 2020-04-03 DIAGNOSIS — H0102B Squamous blepharitis left eye, upper and lower eyelids: Secondary | ICD-10-CM | POA: Diagnosis not present

## 2020-04-03 DIAGNOSIS — H353131 Nonexudative age-related macular degeneration, bilateral, early dry stage: Secondary | ICD-10-CM | POA: Diagnosis not present

## 2020-04-03 DIAGNOSIS — H43813 Vitreous degeneration, bilateral: Secondary | ICD-10-CM | POA: Diagnosis not present

## 2020-05-16 NOTE — Addendum Note (Signed)
Addended by: Aviva Signs M on: 05/16/2020 03:06 PM   Modules accepted: Orders

## 2020-05-19 ENCOUNTER — Ambulatory Visit: Payer: Medicare Other | Admitting: Pharmacist

## 2020-05-19 ENCOUNTER — Other Ambulatory Visit: Payer: Self-pay

## 2020-05-19 DIAGNOSIS — I10 Essential (primary) hypertension: Secondary | ICD-10-CM

## 2020-05-19 DIAGNOSIS — K219 Gastro-esophageal reflux disease without esophagitis: Secondary | ICD-10-CM

## 2020-05-19 DIAGNOSIS — E782 Mixed hyperlipidemia: Secondary | ICD-10-CM

## 2020-05-19 NOTE — Chronic Care Management (AMB) (Signed)
Chronic Care Management Pharmacy  Name: Mark Copeland  MRN: [id number] DOB: 06-Apr-1934   Chief Complaint/ HPI  Mark Copeland,  86 y.o. , male presents for their Initial CCM visit with the clinical pharmacist via telephone due to COVID-19 Pandemic.  PCP : Binnie Rail, MD Patient Care Team: Binnie Rail, MD as PCP - General (Internal Medicine) O'Neal, Cassie Freer, MD as PCP - Cardiology (Cardiology) Teena Irani, MD (Inactive) as Consulting Physician (Gastroenterology) Charlton Haws, [facility] as Pharmacist (Pharmacist)  Their chronic conditions include: Hypertension, Hyperlipidemia, GERD, BPH and Allergies   Pt exercises frequently (walking, biking, weights) and enjoys travelling with his wife, especially cruises. Hobbies include wood working.  Office Visits: 01/11/20 Dr Quay Burow OV: acute reflux; stop pepcid, start omeprazole 40 mg x 4 wks, may decrease dose if sx improve  Consult Visit: None in previous 6 months  Allergies  Allergen Reactions  . Nitrofurantoin Nausea Only  . Penicillins     Hives Because of a history of documented adverse serious drug reaction;Medi Alert bracelet  is recommended  . Sulfonamide Derivatives     Scrotal exfoliative rash Because of a history of documented adverse serious drug reaction;Medi Alert bracelet  is recommended   . Amlodipine     See 08/10/2013  . Shellfish Allergy Diarrhea and Nausea And Vomiting    Gi problems   . Tamsulosin     REACTION: ? caused palpitations    Medications: Outpatient Encounter Medications as of 05/19/2020  Medication Sig  . amLODipine (NORVASC) 5 MG tablet TAKE 1 TABLET BY MOUTH  DAILY  . famotidine (PEPCID) 20 MG tablet Take 20 mg by mouth as needed for heartburn or indigestion.  . fexofenadine (ALLEGRA) 180 MG tablet Take 180 mg by mouth daily as needed (allergies).  . fluticasone (FLONASE) 50 MCG/ACT nasal spray Place 2 sprays into the nose 2 (two) times daily as needed for rhinitis or  allergies.  . Multiple Vitamins-Minerals (PRESERVISION AREDS PO) Take 1 tablet by mouth daily.  Marland Kitchen telmisartan-hydrochlorothiazide (MICARDIS HCT) 40-12.5 MG tablet TAKE 1 TABLET BY MOUTH  DAILY  . omeprazole (PRILOSEC) 40 MG capsule Take 1 capsule (40 mg total) by mouth daily. Take 30 minutes prior to a meal (Patient not taking: Reported on 05/19/2020)   No facility-administered encounter medications on file as of 05/19/2020.     Current Diagnosis/Assessment:  SDOH Interventions     Most Recent Value  SDOH Interventions  Financial Strain Interventions Intervention Not Indicated      Goals Addressed            This Visit's Progress   . Pharmacy Care Plan       CARE PLAN ENTRY (see longitudinal plan of care for additional care plan information)  Current Barriers:  . Chronic Disease Management support, education, and care coordination needs related to Hypertension, Hyperlipidemia, and GERD   Hypertension BP Readings from Last 3 Encounters:  01/11/20 (!) 150/74  10/18/19 (!) 162/84  09/28/19 (!) 165/87 .  Pharmacist Clinical Goal(s): o Over the next 180 days, patient will work with PharmD and providers to achieve BP goal <140/90 . Current regimen:  o Amlodipine 5 mg daily o Telmisartan-HCTZ 40-12.5 mg daily  . Interventions: o Discussed BP goals and benefits of medication for prevention of heart attack / stroke . Patient self care activities - Over the next 180 days, patient will: o Check BP as needed, document, and provide at future appointments o Ensure daily salt intake < 2300  mg/day  Hyperlipidemia Lab Results  Component Value Date/Time   LDLCALC 94 05/21/2019 09:09 AM .  Pharmacist Clinical Goal(s): o Over the next 180 days, patient will work with PharmD and providers to maintain LDL goal < 100 . Current regimen:  o No medications . Interventions: o Discussed cholesterol goals and benefits of diet/exercise for prevention of heart attack / stroke . Patient self  care activities - Over the next 180 days, patient will: o Continue exercise routine  GERD . Pharmacist Clinical Goal(s) o Over the next 180 days, patient will work with PharmD and providers to optimize therapy . Current regimen:  o Omeprazole 40 mg as needed o Famotidine 20 mg as needed . Interventions: o Discussed using reflux medication on as-needed basis . Patient self care activities - Over the next 180 days, patient will: o Continue to use famotidine as needed; reserve omeprazole from severe reflux  Medication management . Pharmacist Clinical Goal(s): o Over the next 180 days, patient will work with PharmD and providers to maintain optimal medication adherence . Current pharmacy: [facility] mail order . Interventions o Comprehensive medication review performed. o Continue current medication management strategy . Patient self care activities - Over the next 180 days, patient will: o Focus on medication adherence by fill date o Take medications as prescribed o Report any questions or concerns to PharmD and/or provider(s)  Initial goal documentation       Hypertension   BP goal is:  <140/90  Office blood pressures are  BP Readings from Last 3 Encounters:  01/11/20 (!) 150/74  10/18/19 (!) 162/84  09/28/19 (!) 165/87   Kidney Function Lab Results  Component Value Date/Time   CREATININE 1.10 05/21/2019 09:09 AM   CREATININE 1.02 06/28/2018 03:45 AM   GFR 63.92 05/21/2019 09:09 AM   GFRNONAA >60 06/28/2018 03:45 AM   GFRAA >60 06/28/2018 03:45 AM   K 4.2 05/21/2019 09:09 AM   K 4.0 06/28/2018 03:45 AM   Patient checks BP at home 1-2x per week Patient home BP readings are ranging: 130s/80s  Patient has failed these meds in the past: carvedilol, losartan, Patient is currently controlled on the following medications:  . Amlodipine 5 mg daily . Telmisartan-HCTZ 40-12.5 mg daily   We discussed: exercise/walks 6 days/wk, rides bike 6 miles every other day. Pt  reports home BP is controlled. Denies issues with meds.  Plan  Continue current medications and control with diet and exercise     Hyperlipidemia   LDL goal < 100  Lipid Panel     Component Value Date/Time   CHOL 163 05/21/2019 0909   TRIG 77.0 05/21/2019 0909   HDL 53.10 05/21/2019 0909   LDLCALC 94 05/21/2019 0909    Hepatic Function Latest Ref Rng & Units 05/21/2019 04/27/2018 04/25/2017  Total Protein 6.0 - 8.3 g/dL 7.1 6.9 6.9  Albumin 3.5 - 5.2 g/dL 4.8 4.7 4.8  AST 0 - 37 U/L _0 ALT 0 - 53 U/L _1 Alk Phosphatase 39 - 117 U/L 68 79 70  Total Bilirubin 0.2 - 1.2 mg/dL 0.7 0.9 0.9  Bilirubin, Direct 0.0 - 0.3 mg/dL - - -     The ASCVD Risk score Mikey Bussing DC Jr., et al., 2013) failed to calculate for the following reasons:   The 2013 ASCVD risk score is only valid for ages 15 to 27   Patient has failed these meds in past: n/a Patient is currently controlled on the following medications:  .  No medication  We discussed:  diet and exercise extensively  Plan  Continue control with diet and exercise  GERD   Patient has failed these meds in past: n/a Patient is currently controlled on the following medications:  . Omeprazole 40 mg daily - not taking . Famotidine 40 mg PRN  We discussed:  Pt reports reflux is under control without meds, he takes famotidine as needed and hardly ever takes PPI.  Plan  Continue current medications  Allergies   Patient has failed these meds in past: n/a Patient is currently controlled on the following medications:  . Flonase nasal spray PRN . Fexofenadine 180 mg daily PRN  We discussed:  Pt suffers seasonal allergies, takes meds as needed  Plan  Continue current medications  Health Maintenance   Patient is currently controlled on the following medications:  . Preservision Areds  We discussed:  Patient is satisfied with current OTC regimen and denies issues  Plan  Continue current medications  Medication  Management   Pt uses [facility] for all medications Uses pill box? No - prefers bottles Pt endorses 100% compliance  We discussed: Pt is satisfied with mail order, all medications are free.  Plan  Continue current medication management strategy    Follow up: 6 month phone visit  Charlene Brooke, PharmD Clinical Pharmacist [facility] [phone]

## 2020-05-19 NOTE — Patient Instructions (Addendum)
Visit Information  Phone number for Pharmacist: [phone]  Thank you for meeting with me to discuss your medications! I look forward to working with you to achieve your health care goals. Below is a summary of what we talked about during the visit:  Goals Addressed            This Visit's Progress   . Pharmacy Care Plan       CARE PLAN ENTRY (see longitudinal plan of care for additional care plan information)  Current Barriers:  . Chronic Disease Management support, education, and care coordination needs related to Hypertension, Hyperlipidemia, and GERD   Hypertension BP Readings from Last 3 Encounters:  01/11/20 (!) 150/74  10/18/19 (!) 162/84  09/28/19 (!) 165/87 .  Pharmacist Clinical Goal(s): o Over the next 180 days, patient will work with PharmD and providers to achieve BP goal <140/90 . Current regimen:  o Amlodipine 5 mg daily o Telmisartan-HCTZ 40-12.5 mg daily  . Interventions: o Discussed BP goals and benefits of medication for prevention of heart attack / stroke . Patient self care activities - Over the next 180 days, patient will: o Check BP as needed, document, and provide at future appointments o Ensure daily salt intake < 2300 mg/day  Hyperlipidemia Lab Results  Component Value Date/Time   LDLCALC 94 05/21/2019 09:09 AM .  Pharmacist Clinical Goal(s): o Over the next 180 days, patient will work with PharmD and providers to maintain LDL goal < 100 . Current regimen:  o No medications . Interventions: o Discussed cholesterol goals and benefits of diet/exercise for prevention of heart attack / stroke . Patient self care activities - Over the next 180 days, patient will: o Continue exercise routine  GERD . Pharmacist Clinical Goal(s) o Over the next 180 days, patient will work with PharmD and providers to optimize therapy . Current regimen:  o Omeprazole 40 mg as needed o Famotidine 20 mg as needed . Interventions: o Discussed using reflux  medication on as-needed basis . Patient self care activities - Over the next 180 days, patient will: o Continue to use famotidine as needed; reserve omeprazole from severe reflux  Medication management . Pharmacist Clinical Goal(s): o Over the next 180 days, patient will work with PharmD and providers to maintain optimal medication adherence . Current pharmacy: [facility] mail order . Interventions o Comprehensive medication review performed. o Continue current medication management strategy . Patient self care activities - Over the next 180 days, patient will: o Focus on medication adherence by fill date o Take medications as prescribed o Report any questions or concerns to PharmD and/or provider(s)  Initial goal documentation       Mr. Shawn was given information about Chronic Care Management services today including:  1. CCM service includes personalized support from designated clinical staff supervised by his physician, including individualized plan of care and coordination with other care providers 2. 24/7 contact phone numbers for assistance for urgent and routine care needs. 3. Standard insurance, coinsurance, copays and deductibles apply for chronic care management only during months in which we provide at least 20 minutes of these services. Most insurances cover these services at 100%, however patients may be responsible for any copay, coinsurance and/or deductible if applicable. This service may help you avoid the need for more expensive face-to-face services. 4. Only one practitioner may furnish and bill the service in a calendar month. 5. The patient may stop CCM services at any time (effective at the end of the month) by  phone call to the office staff.  Patient agreed to services and verbal consent obtained.   Patient verbalizes understanding of instructions provided today.  Telephone follow up appointment with pharmacy team member scheduled for: 6 months  Charlene Brooke, PharmD Clinical Pharmacist [facility] After Age 79 After age 44, you are at a higher risk for certain long-term diseases and infections as well as injuries from falls. Falls are a major cause of broken bones and head injuries in people who are older than age 24. Getting regular preventive care can help to keep you healthy and well. Preventive care includes getting regular testing and making lifestyle changes as recommended by your health care provider. Talk with your health care provider about:  Which screenings and tests you should have. A screening is a test that checks for a disease when you have no symptoms.  A diet and exercise plan that is right for you. What should I know about screenings and tests to prevent falls? Screening and testing are the best ways to find a health problem early. Early diagnosis and treatment give you the best chance of managing medical conditions that are common after age 69. Certain conditions and lifestyle choices may make you more likely to have a fall. Your health care provider may recommend:  Regular vision checks. Poor vision and conditions such as cataracts can make you more likely to have a fall. If you wear glasses, make sure to get your prescription updated if your vision changes.  Medicine review. Work with your health care provider to regularly review all of the medicines you are taking, including over-the-counter medicines. Ask your health care provider about any side effects that may make you more likely to have a fall. Tell your health care provider if any medicines that you take make you feel dizzy or sleepy.  Osteoporosis screening. Osteoporosis is a condition that causes the bones to get weaker. This can make the bones weak and cause them to break more easily.  Blood pressure screening. Blood pressure changes and medicines to control blood pressure can make you feel  dizzy.  Strength and balance checks. Your health care provider may recommend certain tests to check your strength and balance while standing, walking, or changing positions.  Foot health exam. Foot pain and numbness, as well as not wearing proper footwear, can make you more likely to have a fall.  Depression screening. You may be more likely to have a fall if you have a fear of falling, feel emotionally low, or feel unable to do activities that you used to do.  Alcohol use screening. Using too much alcohol can affect your balance and may make you more likely to have a fall. What actions can I take to lower my risk of falls? General instructions  Talk with your health care provider about your risks for falling. Tell your health care provider if: ? You fall. Be sure to tell your health care provider about all falls, even ones that seem minor. ? You feel dizzy, sleepy, or off-balance.  Take over-the-counter and prescription medicines only as told by your health care provider. These include any supplements.  Eat a healthy diet and maintain a healthy weight. A healthy diet includes low-fat dairy products, low-fat (lean) meats, and fiber from whole grains, beans, and lots of fruits and vegetables. Home safety  Remove any tripping hazards, such as rugs, cords, and clutter.  Install safety equipment such as grab  bars in bathrooms and safety rails on stairs.  Keep rooms and walkways well-lit. Activity   Follow a regular exercise program to stay fit. This will help you maintain your balance. Ask your health care provider what types of exercise are appropriate for you.  If you need a cane or walker, use it as recommended by your health care provider.  Wear supportive shoes that have nonskid soles. Lifestyle  Do not drink alcohol if your health care provider tells you not to drink.  If you drink alcohol, limit how much you have: ? 0-1 drink a day for women. ? 0-2 drinks a day for  men.  Be aware of how much alcohol is in your drink. In the [place] one drink equals one typical bottle of beer (12 oz), one-half glass of wine (5 oz), or one shot of hard liquor (1 oz).  Do not use any products that contain nicotine or tobacco, such as cigarettes and e-cigarettes. If you need help quitting, ask your health care provider. Summary  Having a healthy lifestyle and getting preventive care can help to protect your health and wellness after age 32.  Screening and testing are the best way to find a health problem early and help you avoid having a fall. Early diagnosis and treatment give you the best chance for managing medical conditions that are more common for people who are older than age 58.  Falls are a major cause of broken bones and head injuries in people who are older than age 20. Take precautions to prevent a fall at home.  Work with your health care provider to learn what changes you can make to improve your health and wellness and to prevent falls. This information is not intended to replace advice given to you by your health care provider. Make sure you discuss any questions you have with your health care provider. Document Revised: 02/18/2019 Document Reviewed: 09/10/2017 Elsevier Patient Education  2020 [employer].

## 2020-05-22 NOTE — Progress Notes (Signed)
Subjective:    Patient ID: Mark Copeland, male    DOB: [birth year], 86 y.o.   MRN: [id number]  HPI  He is here for a physical exam.    Overall he feels good and has no concerns.   He took omeprazole 40 mg x 4 weeks.  He now takes pepcid 40 mg prn.  His GERD is controlled.    His BP at home is controlled -   119/75, 131/78, 119/75, 117/76.     Medications and allergies reviewed with patient and updated if appropriate.  Patient Active Problem List   Diagnosis Date Noted  . Bilateral sensorineural hearing loss 06/25/2018  . Otalgia of both ears 06/18/2018  . Dizziness 06/18/2018  . Macular degeneration 04/27/2018  . Mild aortic regurgitation 04/27/2018  . Radiculopathy of leg 07/09/2017  . 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 Bilateral temporomandibular joint pain 04/25/2017  . Hiatal hernia, small 04/23/2016  . Erectile dysfunction 05/06/2012  . Bladder outlet obstruction 05/06/2012  . Hypertension 11/13/2011  . Abnormal chest x-ray 11/13/2011  . Constipation 11/02/2010  . NEPHROLITHIASIS, HX OF 10/14/2010  . Prediabetes 10/11/2010  . SKIN CANCER, HX OF 10/11/2010  . INTENTION TREMOR 09/15/2009  . BPH (benign prostatic hyperplasia) 09/15/2009  . History of colonic polyps 12/05/2008  . Hyperlipidemia 02/18/2008  . Esophageal reflux 02/18/2008  . INTERSTITIAL CYSTITIS 02/18/2008    Current Outpatient Medications on File Prior to Visit  Medication Sig Dispense Refill  . amLODipine (NORVASC) 5 MG tablet TAKE 1 TABLET BY MOUTH  DAILY 90 tablet 1  . famotidine (PEPCID) 20 MG tablet Take 20 mg by mouth as needed for heartburn or indigestion.    . fexofenadine (ALLEGRA) 180 MG tablet Take 180 mg by mouth daily as needed (allergies).    . fluticasone (FLONASE) 50 MCG/ACT nasal spray Place 2 sprays into the nose 2 (two) times daily as needed for rhinitis or allergies.    . Hydrocortisone-Iodoquinol 1-1 % CREA Apply 1 application topically daily.    . Multiple  Vitamins-Minerals (PRESERVISION AREDS PO) Take 1 tablet by mouth daily.    Marland Kitchen telmisartan-hydrochlorothiazide (MICARDIS HCT) 40-12.5 MG tablet TAKE 1 TABLET BY MOUTH  DAILY 90 tablet 3   No current facility-administered medications on file prior to visit.    Past Medical History:  Diagnosis Date  . BPH (benign prostatic hypertrophy)   . Colonic polyp 2011   Dr.John Amedeo Plenty  . Fasting hyperglycemia   . Hiatal hernia 2012  . Hyperlipidemia   . Interstitial cystitis    presentation in 1997 w/ abdominal pain/distention, Dr. Lawrence Santiago, [facility]  . Nephrolithiasis    x 2  . Skin cancer    basal cell, Dr.Dan Ronnald Ramp    Past Surgical History:  Procedure Laterality Date  . COLONOSCOPY W/ POLYPECTOMY     and esophageal dilation 01/2008 by Dr.Hayes; bladder stones s/p urethral dilation 10/2007 by Dr. Amalia Hailey  . CYSTOSCOPY     2004/2011; Hospitalized 1997 w/ I.C.  . LAPAROSCOPIC CHOLECYSTECTOMY  07/14/11  . SEPTOPLASTY    . TONSILLECTOMY    . UPPER GASTROINTESTINAL ENDOSCOPY  2012   Dr Teena Irani; hiatal hernia    Social History   Socioeconomic History  . Marital status: Married    Spouse name: Not on file  . Number of children: 1  . Years of education: Not on file  . Highest education level: Not on file  Occupational History  . Not on file  Tobacco Use  . Smoking  status: Former Smoker    Quit date: 08/04/1960    Years since quitting: 59.8  . Smokeless tobacco: Never Used  . Tobacco comment: smoked 1958-1961, up to 2 cigars / day  Substance and Sexual Activity  . Alcohol use: Yes    Comment:  occasional wine  . Drug use: No  . Sexual activity: Not Currently  Other Topics Concern  . Not on file  Social History Narrative   Retired Electrical engineer      Social Determinants of Health   Financial Resource Strain: [place]   . Difficulty of Paying Living Expenses: Not hard at all  Food Insecurity:   . Worried About Charity fundraiser in the Last Year:   . Arboriculturist in the  Last Year:   Transportation Needs:   . Film/video editor (Medical):   Marland Kitchen Lack of Transportation (Non-Medical):   Physical Activity:   . Days of Exercise per Week:   . Minutes of Exercise per Session:   Stress:   . Feeling of Stress :   Social Connections:   . Frequency of Communication with Friends and Family:   . Frequency of Social Gatherings with Friends and Family:   . Attends Religious Services:   . Active Member of Clubs or Organizations:   . Attends Archivist Meetings:   Marland Kitchen Marital Status:     Family History  Problem Relation Age of Onset  . Transient ischemic attack Father 88  . Hypertension Father   . Coronary artery disease Father   . Stroke Mother 35  . Hypertension Brother   . Diabetes Neg Hx   . Cancer Neg Hx     Review of Systems  Constitutional: Negative for chills and fever.  HENT: Positive for hearing loss and tinnitus (occ).   Eyes: Negative for visual disturbance.  Respiratory: Negative for cough, shortness of breath and wheezing.   Cardiovascular: Negative for chest pain, palpitations and leg swelling.  Gastrointestinal: Negative for abdominal pain, blood in stool, constipation, diarrhea and nausea.  Genitourinary: Negative for difficulty urinating and dysuria.  Musculoskeletal: Negative for arthralgias and back pain.  Neurological: Negative for dizziness, light-headedness and headaches.  Psychiatric/Behavioral: Negative for dysphoric mood. The patient is not nervous/anxious.        Objective:   Vitals:   05/23/20 0750  BP: (!) 148/82  Pulse: (!) 56  Temp: 98 F (36.7 C)  SpO2: 98%   BP Readings from Last 3 Encounters:  05/23/20 (!) 148/82  01/11/20 (!) 150/74  10/18/19 (!) 162/84   Wt Readings from Last 3 Encounters:  05/23/20 167 lb (75.8 kg)  01/11/20 171 lb (77.6 kg)  10/18/19 171 lb (77.6 kg)   Body mass index is 23.96 kg/m.   Physical Exam    Constitutional: He appears well-developed and well-nourished. No  distress.  HENT:  Head: Normocephalic and atraumatic.  Right Ear: External ear normal.  Left Ear: External ear normal.  Mouth/Throat: Oropharynx is clear and moist.  Normal ear canals and TM b/l  Eyes: Conjunctivae and EOM are normal.  Neck: Neck supple. No tracheal deviation present. No thyromegaly present.  No carotid bruit  Cardiovascular: Normal rate, regular rhythm, normal heart sounds and intact distal pulses.   No murmur heard. Pulmonary/Chest: Effort normal and breath sounds normal. No respiratory distress. He has no wheezes. He has no rales.  Abdominal: Soft. He exhibits no distension. There is no tenderness.  Genitourinary: deferred  Musculoskeletal: He exhibits no  edema.  Lymphadenopathy:   He has no cervical adenopathy.  Skin: Skin is warm and dry. He is not diaphoretic.  Psychiatric: He has a normal mood and affect. His behavior is normal.      Assessment & Plan:    Physical exam: Screening blood work Immunizations   Up to date  Colonoscopy   Aged out Eye exams  Up to date  Exercise  Riding bike 2/week, walks 2 miles 6 days a week Weight  normal Skin  Sees derm annuallly Substance abuse   none  See Problem List for Assessment and Plan of chronic medical problems.    See Problem List for Assessment and Plan of chronic medical problems.    This visit occurred during the SARS-CoV-2 public health emergency.  Safety protocols were in place, including screening questions prior to the visit, additional usage of staff PPE, and extensive cleaning of exam room while observing appropriate contact time as indicated for disinfecting solutions.

## 2020-05-23 ENCOUNTER — Ambulatory Visit (INDEPENDENT_AMBULATORY_CARE_PROVIDER_SITE_OTHER): Payer: Medicare Other | Admitting: Internal Medicine

## 2020-05-23 ENCOUNTER — Encounter: Payer: Self-pay | Admitting: Internal Medicine

## 2020-05-23 ENCOUNTER — Other Ambulatory Visit: Payer: Self-pay

## 2020-05-23 VITALS — BP 148/82 | HR 56 | Temp 98.0°F | Ht 70.0 in | Wt 167.0 lb

## 2020-05-23 DIAGNOSIS — I10 Essential (primary) hypertension: Secondary | ICD-10-CM | POA: Diagnosis not present

## 2020-05-23 DIAGNOSIS — R7303 Prediabetes: Secondary | ICD-10-CM | POA: Diagnosis not present

## 2020-05-23 DIAGNOSIS — Z0000 Encounter for general adult medical examination without abnormal findings: Secondary | ICD-10-CM

## 2020-05-23 DIAGNOSIS — E782 Mixed hyperlipidemia: Secondary | ICD-10-CM

## 2020-05-23 DIAGNOSIS — K219 Gastro-esophageal reflux disease without esophagitis: Secondary | ICD-10-CM

## 2020-05-23 NOTE — Assessment & Plan Note (Signed)
Chronic Controlled with pepcid prn Continue prn pepcid

## 2020-05-23 NOTE — Assessment & Plan Note (Signed)
Chronic Check a1c Low sugar / carb diet Stressed regular exercise  

## 2020-05-23 NOTE — Patient Instructions (Signed)
Blood work was ordered.    All other Health Maintenance issues reviewed.   All recommended immunizations and age-appropriate screenings are up-to-date or discussed.  No immunization administered today.   Medications reviewed and updated.  Changes include :   none    Please followup in 1 year    Health Maintenance, Male Adopting a healthy lifestyle and getting preventive care are important in promoting health and wellness. Ask your health care provider about:  The right schedule for you to have regular tests and exams.  Things you can do on your own to prevent diseases and keep yourself healthy. What should I know about diet, weight, and exercise? Eat a healthy diet   Eat a diet that includes plenty of vegetables, fruits, low-fat dairy products, and lean protein.  Do not eat a lot of foods that are high in solid fats, added sugars, or sodium. Maintain a healthy weight Body mass index (BMI) is a measurement that can be used to identify possible weight problems. It estimates body fat based on height and weight. Your health care provider can help determine your BMI and help you achieve or maintain a healthy weight. Get regular exercise Get regular exercise. This is one of the most important things you can do for your health. Most adults should:  Exercise for at least 150 minutes each week. The exercise should increase your heart rate and make you sweat (moderate-intensity exercise).  Do strengthening exercises at least twice a week. This is in addition to the moderate-intensity exercise.  Spend less time sitting. Even light physical activity can be beneficial. Watch cholesterol and blood lipids Have your blood tested for lipids and cholesterol at 84 years of age, then have this test every 5 years. You may need to have your cholesterol levels checked more often if:  Your lipid or cholesterol levels are high.  You are older than 84 years of age.  You are at high risk for  heart disease. What should I know about cancer screening? Many types of cancers can be detected early and may often be prevented. Depending on your health history and family history, you may need to have cancer screening at various ages. This may include screening for:  Colorectal cancer.  Prostate cancer.  Skin cancer.  Lung cancer. What should I know about heart disease, diabetes, and high blood pressure? Blood pressure and heart disease  High blood pressure causes heart disease and increases the risk of stroke. This is more likely to develop in people who have high blood pressure readings, are of African descent, or are overweight.  Talk with your health care provider about your target blood pressure readings.  Have your blood pressure checked: ? Every 3-5 years if you are 18-39 years of age. ? Every year if you are 40 years old or older.  If you are between the ages of 65 and 75 and are a current or former smoker, ask your health care provider if you should have a one-time screening for abdominal aortic aneurysm (AAA). Diabetes Have regular diabetes screenings. This checks your fasting blood sugar level. Have the screening done:  Once every three years after age 45 if you are at a normal weight and have a low risk for diabetes.  More often and at a younger age if you are overweight or have a high risk for diabetes. What should I know about preventing infection? Hepatitis B If you have a higher risk for hepatitis B, you should be screened for   this virus. Talk with your health care provider to find out if you are at risk for hepatitis B infection. Hepatitis C Blood testing is recommended for:  Everyone born from 1945 through 1965.  Anyone with known risk factors for hepatitis C. Sexually transmitted infections (STIs)  You should be screened each year for STIs, including gonorrhea and chlamydia, if: ? You are sexually active and are younger than 84 years of age. ? You are  older than 84 years of age and your health care provider tells you that you are at risk for this type of infection. ? Your sexual activity has changed since you were last screened, and you are at increased risk for chlamydia or gonorrhea. Ask your health care provider if you are at risk.  Ask your health care provider about whether you are at high risk for HIV. Your health care provider may recommend a prescription medicine to help prevent HIV infection. If you choose to take medicine to prevent HIV, you should first get tested for HIV. You should then be tested every 3 months for as long as you are taking the medicine. Follow these instructions at home: Lifestyle  Do not use any products that contain nicotine or tobacco, such as cigarettes, e-cigarettes, and chewing tobacco. If you need help quitting, ask your health care provider.  Do not use street drugs.  Do not share needles.  Ask your health care provider for help if you need support or information about quitting drugs. Alcohol use  Do not drink alcohol if your health care provider tells you not to drink.  If you drink alcohol: ? Limit how much you have to 0-2 drinks a day. ? Be aware of how much alcohol is in your drink. In the [place] one drink equals one 12 oz bottle of beer (355 mL), one 5 oz glass of wine (148 mL), or one 1 oz glass of hard liquor (44 mL). General instructions  Schedule regular health, dental, and eye exams.  Stay current with your vaccines.  Tell your health care provider if: ? You often feel depressed. ? You have ever been abused or do not feel safe at home. Summary  Adopting a healthy lifestyle and getting preventive care are important in promoting health and wellness.  Follow your health care provider's instructions about healthy diet, exercising, and getting tested or screened for diseases.  Follow your health care provider's instructions on monitoring your cholesterol and blood pressure. This  information is not intended to replace advice given to you by your health care provider. Make sure you discuss any questions you have with your health care provider. Document Revised: 10/21/2018 Document Reviewed: 10/21/2018 Elsevier Patient Education  2020 Elsevier Inc.  

## 2020-05-23 NOTE — Assessment & Plan Note (Signed)
Chronic Check lipid panel  Diet controlled Regular exercise and healthy diet encouraged

## 2020-05-23 NOTE — Assessment & Plan Note (Signed)
Chronic BP well controlled at home, has some white coat tension Current regimen effective and well tolerated Continue current medications at current doses cmp

## 2020-05-24 LAB — COMPREHENSIVE METABOLIC PANEL
AG Ratio: 2.1 (calc) (ref 1.0–2.5)
ALT: 10 U/L (ref 9–46)
AST: 15 U/L (ref 10–35)
Albumin: 4.6 g/dL (ref 3.6–5.1)
Alkaline phosphatase (APISO): 67 U/L (ref 35–144)
BUN: 16 mg/dL (ref 7–25)
CO2: 29 mmol/L (ref 20–32)
Calcium: 10.1 mg/dL (ref 8.6–10.3)
Chloride: 101 mmol/L (ref 98–110)
Creat: 1 mg/dL (ref 0.70–1.11)
Globulin: 2.2 g/dL (calc) (ref 1.9–3.7)
Glucose, Bld: 109 mg/dL — ABNORMAL HIGH (ref 65–99)
Potassium: 4.6 mmol/L (ref 3.5–5.3)
Sodium: 140 mmol/L (ref 135–146)
Total Bilirubin: 0.8 mg/dL (ref 0.2–1.2)
Total Protein: 6.8 g/dL (ref 6.1–8.1)

## 2020-05-24 LAB — CBC WITH DIFFERENTIAL/PLATELET
Absolute Monocytes: 713 cells/uL (ref 200–950)
Basophils Absolute: 148 cells/uL (ref 0–200)
Basophils Relative: 1.7 %
Eosinophils Absolute: 731 cells/uL — ABNORMAL HIGH (ref 15–500)
Eosinophils Relative: 8.4 %
HCT: 50.2 % — ABNORMAL HIGH (ref 38.5–50.0)
Hemoglobin: 16.7 g/dL (ref 13.2–17.1)
Lymphs Abs: 2862 cells/uL (ref 850–3900)
MCH: 29.9 pg (ref 27.0–33.0)
MCHC: 33.3 g/dL (ref 32.0–36.0)
MCV: 89.8 fL (ref 80.0–100.0)
MPV: 9.8 fL (ref 7.5–12.5)
Monocytes Relative: 8.2 %
Neutro Abs: 4246 cells/uL (ref 1500–7800)
Neutrophils Relative %: 48.8 %
Platelets: 351 10*3/uL (ref 140–400)
RBC: 5.59 10*6/uL (ref 4.20–5.80)
RDW: 12.3 % (ref 11.0–15.0)
Total Lymphocyte: 32.9 %
WBC: 8.7 10*3/uL (ref 3.8–10.8)

## 2020-05-24 LAB — LIPID PANEL
Cholesterol: 188 mg/dL (ref <200)
HDL: 53 mg/dL (ref >=40)
LDL Cholesterol (Calc): 110 mg/dL (calc) — ABNORMAL HIGH
Non-HDL Cholesterol (Calc): 135 mg/dL (calc) — ABNORMAL HIGH (ref <130)
Total CHOL/HDL Ratio: 3.5 (calc) (ref <5.0)
Triglycerides: 130 mg/dL (ref <150)

## 2020-05-24 LAB — HEMOGLOBIN A1C
Hgb A1c MFr Bld: 5.6 % of total Hgb (ref <5.7)
Mean Plasma Glucose: 114 (calc)
eAG (mmol/L): 6.3 (calc)

## 2020-05-24 LAB — TSH: TSH: 1.41 mIU/L (ref 0.40–4.50)

## 2020-08-08 DIAGNOSIS — N320 Bladder-neck obstruction: Secondary | ICD-10-CM | POA: Diagnosis not present

## 2020-08-08 DIAGNOSIS — N3010 Interstitial cystitis (chronic) without hematuria: Secondary | ICD-10-CM | POA: Diagnosis not present

## 2020-08-11 ENCOUNTER — Other Ambulatory Visit: Payer: Self-pay

## 2020-08-11 ENCOUNTER — Ambulatory Visit (INDEPENDENT_AMBULATORY_CARE_PROVIDER_SITE_OTHER): Payer: Medicare Other

## 2020-08-11 DIAGNOSIS — Z23 Encounter for immunization: Secondary | ICD-10-CM | POA: Diagnosis not present

## 2020-09-09 ENCOUNTER — Ambulatory Visit: Payer: Medicare Other

## 2020-09-29 ENCOUNTER — Other Ambulatory Visit: Payer: Self-pay | Admitting: Internal Medicine

## 2020-09-29 DIAGNOSIS — I10 Essential (primary) hypertension: Secondary | ICD-10-CM

## 2020-10-19 ENCOUNTER — Telehealth: Payer: Self-pay | Admitting: Pharmacist

## 2020-10-19 NOTE — Progress Notes (Signed)
    Chronic Care Management Pharmacy Assistant   Name: Mark Copeland  MRN: [id number] DOB: 11-Sep-1934  Reason for Encounter: Medication Adherence Call    PCP : Binnie Rail, MD  Allergies:   Allergies  Allergen Reactions  . Nitrofurantoin Nausea Only  . Penicillins     Hives Because of a history of documented adverse serious drug reaction;Medi Alert bracelet  is recommended  . Sulfonamide Derivatives     Scrotal exfoliative rash Because of a history of documented adverse serious drug reaction;Medi Alert bracelet  is recommended   . Amlodipine     See 08/10/2013  . Shellfish Allergy Diarrhea and Nausea And Vomiting    Gi problems   . Tamsulosin     REACTION: ? caused palpitations    Medications: Outpatient Encounter Medications as of 10/19/2020  Medication Sig  . amLODipine (NORVASC) 5 MG tablet TAKE 1 TABLET BY MOUTH  DAILY  . famotidine (PEPCID) 20 MG tablet Take 20 mg by mouth as needed for heartburn or indigestion.  . fexofenadine (ALLEGRA) 180 MG tablet Take 180 mg by mouth daily as needed (allergies).  . fluticasone (FLONASE) 50 MCG/ACT nasal spray Place 2 sprays into the nose 2 (two) times daily as needed for rhinitis or allergies.  . Hydrocortisone-Iodoquinol 1-1 % CREA Apply 1 application topically daily.  . Multiple Vitamins-Minerals (PRESERVISION AREDS PO) Take 1 tablet by mouth daily.  Marland Kitchen telmisartan-hydrochlorothiazide (MICARDIS HCT) 40-12.5 MG tablet TAKE 1 TABLET BY MOUTH  DAILY   No facility-administered encounter medications on file as of 10/19/2020.    Current Diagnosis: Patient Active Problem List   Diagnosis Date Noted  . Bilateral sensorineural hearing loss 06/25/2018  . Otalgia of both ears 06/18/2018  . Dizziness 06/18/2018  . Macular degeneration 04/27/2018  . Mild aortic regurgitation 04/27/2018  . Radiculopathy of leg 07/09/2017  . 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 Bilateral temporomandibular joint pain 04/25/2017  . Hiatal hernia,  small 04/23/2016  . Erectile dysfunction 05/06/2012  . Bladder outlet obstruction 05/06/2012  . Hypertension 11/13/2011  . Abnormal chest x-ray 11/13/2011  . Constipation 11/02/2010  . NEPHROLITHIASIS, HX OF 10/14/2010  . Prediabetes 10/11/2010  . SKIN CANCER, HX OF 10/11/2010  . INTENTION TREMOR 09/15/2009  . BPH (benign prostatic hyperplasia) 09/15/2009  . History of colonic polyps 12/05/2008  . Hyperlipidemia 02/18/2008  . Esophageal reflux 02/18/2008  . INTERSTITIAL CYSTITIS 02/18/2008    Goals Addressed   None     Follow-Up:  Pharmacist Review   The patient was on the medication adherence list for his Telmisartan-Hydrochlorothiazide 40-12.5mg  his next fill date was on 10/05/2020. After speaking with the patient he stated that he go his medication delivered from [facility] on 11/27 and he has been taking is daily.   Rosendo Gros, [facility]  Practice Team Manager/ CPA (Clinical Pharmacist Assistant) [phone]

## 2020-11-07 DIAGNOSIS — K5900 Constipation, unspecified: Secondary | ICD-10-CM | POA: Diagnosis not present

## 2020-11-07 DIAGNOSIS — N3010 Interstitial cystitis (chronic) without hematuria: Secondary | ICD-10-CM | POA: Diagnosis not present

## 2021-02-06 ENCOUNTER — Telehealth: Payer: Self-pay | Admitting: Pharmacist

## 2021-02-06 NOTE — Progress Notes (Addendum)
    Chronic Care Management Pharmacy Assistant   Name: Mark Copeland  MRN: [id number] DOB: 05-14-1934   Reason for Encounter: General Adherence Call    Recent office visits:  None ID  Recent consult visits:  None ID  Hospital visits:  None in previous 6 months  Medications: Outpatient Encounter Medications as of 02/06/2021  Medication Sig   amLODipine (NORVASC) 5 MG tablet TAKE 1 TABLET BY MOUTH  DAILY   famotidine (PEPCID) 20 MG tablet Take 20 mg by mouth as needed for heartburn or indigestion.   fexofenadine (ALLEGRA) 180 MG tablet Take 180 mg by mouth daily as needed (allergies).   fluticasone (FLONASE) 50 MCG/ACT nasal spray Place 2 sprays into the nose 2 (two) times daily as needed for rhinitis or allergies.   Hydrocortisone-Iodoquinol 1-1 % CREA Apply 1 application topically daily.   Multiple Vitamins-Minerals (PRESERVISION AREDS PO) Take 1 tablet by mouth daily.   telmisartan-hydrochlorothiazide (MICARDIS HCT) 40-12.5 MG tablet TAKE 1 TABLET BY MOUTH  DAILY   No facility-administered encounter medications on file as of 02/06/2021.    Have you had any problems recently with your health? The patient states that he has not had any new problems with his health. Patient states that he exercises daily and feels really good.  Have you had any problems with your pharmacy? The patient states that he does not have any problems with getting his medications or the cost of his medications from the pharmacy.  What issues or side effects are you having with your medications? The patient states that he doe not have any side effects from medications  What would you like me to pass along to [facility], CPP for them to help you with? The patient states that he is doing really well and does not need anything at this time  What can we do to take care of you better? The patient states that he just appreciate the follow call  Star Rating  Drugs: Telmisartan-hydrochlorothiazide 40-12.5 mg Take 1 tab by mouth daily, last fill on 09/29/20   [place] Pharmacist Assistant [phone]

## 2021-02-08 ENCOUNTER — Encounter: Payer: Self-pay | Admitting: Internal Medicine

## 2021-02-08 ENCOUNTER — Other Ambulatory Visit: Payer: Self-pay | Admitting: Internal Medicine

## 2021-02-13 NOTE — Progress Notes (Signed)
A call was made to Mr. Mark Copeland to ask if he needed his telmisartan-hctz refill. Spoke with the patient wife who stated that Dr. Quay Burow last week sent in for a refill online and it was received yesterday.  [place] Pharmacist Assistant [phone]

## 2021-02-27 ENCOUNTER — Other Ambulatory Visit: Payer: Self-pay | Admitting: Internal Medicine

## 2021-02-27 DIAGNOSIS — I10 Essential (primary) hypertension: Secondary | ICD-10-CM

## 2021-03-06 DIAGNOSIS — D0461 Carcinoma in situ of skin of right upper limb, including shoulder: Secondary | ICD-10-CM | POA: Diagnosis not present

## 2021-03-06 DIAGNOSIS — D2272 Melanocytic nevi of left lower limb, including hip: Secondary | ICD-10-CM | POA: Diagnosis not present

## 2021-03-06 DIAGNOSIS — L820 Inflamed seborrheic keratosis: Secondary | ICD-10-CM | POA: Diagnosis not present

## 2021-03-06 DIAGNOSIS — L570 Actinic keratosis: Secondary | ICD-10-CM | POA: Diagnosis not present

## 2021-03-06 DIAGNOSIS — L821 Other seborrheic keratosis: Secondary | ICD-10-CM | POA: Diagnosis not present

## 2021-03-06 DIAGNOSIS — D225 Melanocytic nevi of trunk: Secondary | ICD-10-CM | POA: Diagnosis not present

## 2021-03-06 DIAGNOSIS — D2271 Melanocytic nevi of right lower limb, including hip: Secondary | ICD-10-CM | POA: Diagnosis not present

## 2021-03-06 DIAGNOSIS — D1801 Hemangioma of skin and subcutaneous tissue: Secondary | ICD-10-CM | POA: Diagnosis not present

## 2021-03-06 DIAGNOSIS — Z85828 Personal history of other malignant neoplasm of skin: Secondary | ICD-10-CM | POA: Diagnosis not present

## 2021-03-21 ENCOUNTER — Ambulatory Visit: Payer: Medicare Other

## 2021-03-22 ENCOUNTER — Ambulatory Visit: Payer: Medicare Other

## 2021-03-22 ENCOUNTER — Other Ambulatory Visit: Payer: Self-pay

## 2021-03-30 ENCOUNTER — Telehealth: Payer: Self-pay | Admitting: Pharmacist

## 2021-03-30 NOTE — Progress Notes (Signed)
    Chronic Care Management Pharmacy Assistant   Name: Mark Copeland  MRN: [id number] DOB: 24-Aug-1934   Reason for Encounter: Disease State General Call    Recent office visits:  None ID  Recent consult visits:  None ID  Hospital visits:  None in previous 6 months  Medications: Outpatient Encounter Medications as of 03/30/2021  Medication Sig  . amLODipine (NORVASC) 5 MG tablet TAKE 1 TABLET BY MOUTH  DAILY  . famotidine (PEPCID) 20 MG tablet TAKE 1 TABLET BY MOUTH AT  NIGHT  . fexofenadine (ALLEGRA) 180 MG tablet Take 180 mg by mouth daily as needed (allergies).  . fluticasone (FLONASE) 50 MCG/ACT nasal spray Place 2 sprays into the nose 2 (two) times daily as needed for rhinitis or allergies.  . Hydrocortisone-Iodoquinol 1-1 % CREA Apply 1 application topically daily.  . Multiple Vitamins-Minerals (PRESERVISION AREDS PO) Take 1 tablet by mouth daily.  Marland Kitchen telmisartan-hydrochlorothiazide (MICARDIS HCT) 40-12.5 MG tablet TAKE 1 TABLET BY MOUTH  DAILY   No facility-administered encounter medications on file as of 03/30/2021.    Have you had any problems recently with your health? Patient wife stated that patient does not have any new issues at this time  Have you had any problems with your pharmacy? Wife states that patient does not have any problems with getting his medications or the cost of medications from the pharmacy  What issues or side effects are you having with your medications? Wife states that patient has no side effects from any medications as she know of.  What would you like me to pass along to [facility] for them to help you with?  Wife states that patient is doing well and that he has no concerns about his health or medications at this time  What can we do to take care of you better? Wife states that if any changes in patient health he will call Dr. Quay Burow office  Star Rating Drugs: Telmisartan-hctz 02/08/21 90 ds  [place]  Pharmacist Assistant [phone]  Time spent:10

## 2021-04-04 DIAGNOSIS — H43813 Vitreous degeneration, bilateral: Secondary | ICD-10-CM | POA: Diagnosis not present

## 2021-04-04 DIAGNOSIS — H353131 Nonexudative age-related macular degeneration, bilateral, early dry stage: Secondary | ICD-10-CM | POA: Diagnosis not present

## 2021-04-04 DIAGNOSIS — H0102A Squamous blepharitis right eye, upper and lower eyelids: Secondary | ICD-10-CM | POA: Diagnosis not present

## 2021-04-04 DIAGNOSIS — H0102B Squamous blepharitis left eye, upper and lower eyelids: Secondary | ICD-10-CM | POA: Diagnosis not present

## 2021-04-04 DIAGNOSIS — H2513 Age-related nuclear cataract, bilateral: Secondary | ICD-10-CM | POA: Diagnosis not present

## 2021-04-05 ENCOUNTER — Ambulatory Visit: Payer: Medicare Other

## 2021-04-06 ENCOUNTER — Ambulatory Visit (INDEPENDENT_AMBULATORY_CARE_PROVIDER_SITE_OTHER): Payer: Medicare Other

## 2021-04-06 VITALS — BP 119/67 | HR 70 | Ht 70.0 in | Wt 162.0 lb

## 2021-04-06 DIAGNOSIS — Z0000 Encounter for general adult medical examination without abnormal findings: Secondary | ICD-10-CM | POA: Diagnosis not present

## 2021-04-06 NOTE — Patient Instructions (Signed)
Mr. Mark Copeland , Thank you for taking time to come for your Medicare Wellness Visit. I appreciate your ongoing commitment to your health goals. Please review the following plan we discussed and let me know if I can assist you in the future.   Screening recommendations/referrals: Colonoscopy: no repeat due to age Recommended yearly ophthalmology/optometry visit for glaucoma screening and checkup Recommended yearly dental visit for hygiene and checkup  Vaccinations: Influenza vaccine: 08/11/2020 Pneumococcal vaccine: 09/16/2002, 04/04/2015 Tdap vaccine: overdue Shingles vaccine: 02/20/2018, 7//03/2018   Covid-19: 11/22/2019, 12/12/2019, 08/29/2020, 02/13/2021  Advanced directives: Please bring a copy of your health care power of attorney and living will to the office at your convenience.  Conditions/risks identified: Yes; Reviewed health maintenance screenings with patient today and relevant education, vaccines, and/or referrals were provided. Please continue to do your personal lifestyle choices by: daily care of teeth and gums, regular physical activity (goal should be 5 days a week for 30 minutes), eat a healthy diet, avoid tobacco and drug use, limiting any alcohol intake, taking a low-dose aspirin (if not allergic or have been advised by your provider otherwise) and taking vitamins and minerals as recommended by your provider. Continue doing brain stimulating activities (puzzles, reading, adult coloring books, staying active) to keep memory sharp. Continue to eat heart healthy diet (full of fruits, vegetables, whole grains, lean protein, water--limit salt, fat, and sugar intake) and increase physical activity as tolerated.  Next appointment: Please schedule your next Medicare Wellness Visit with your Nurse Health Advisor in 1 year by calling [phone].  Preventive Care 4 Years and Older, Male Preventive care refers to lifestyle choices and visits with your health care provider that can promote  health and wellness. What does preventive care include?  A yearly physical exam. This is also called an annual well check.  Dental exams once or twice a year.  Routine eye exams. Ask your health care provider how often you should have your eyes checked.  Personal lifestyle choices, including:  Daily care of your teeth and gums.  Regular physical activity.  Eating a healthy diet.  Avoiding tobacco and drug use.  Limiting alcohol use.  Practicing safe sex.  Taking low doses of aspirin every day.  Taking vitamin and mineral supplements as recommended by your health care provider. What happens during an annual well check? The services and screenings done by your health care provider during your annual well check will depend on your age, overall health, lifestyle risk factors, and family history of disease. Counseling  Your health care provider may ask you questions about your:  Alcohol use.  Tobacco use.  Drug use.  Emotional well-being.  Home and relationship well-being.  Sexual activity.  Eating habits.  History of falls.  Memory and ability to understand (cognition).  Work and work Statistician. Screening  You may have the following tests or measurements:  Height, weight, and BMI.  Blood pressure.  Lipid and cholesterol levels. These may be checked every 5 years, or more frequently if you are over 77 years old.  Skin check.  Lung cancer screening. You may have this screening every year starting at age 49 if you have a 30-pack-year history of smoking and currently smoke or have quit within the past 15 years.  Fecal occult blood test (FOBT) of the stool. You may have this test every year starting at age 17.  Flexible sigmoidoscopy or colonoscopy. You may have a sigmoidoscopy every 5 years or a colonoscopy every 10 years starting at age 22.  Prostate cancer screening. Recommendations will vary depending on your family history and other risks.  Hepatitis  C blood test.  Hepatitis B blood test.  Sexually transmitted disease (STD) testing.  Diabetes screening. This is done by checking your blood sugar (glucose) after you have not eaten for a while (fasting). You may have this done every 1-3 years.  Abdominal aortic aneurysm (AAA) screening. You may need this if you are a current or former smoker.  Osteoporosis. You may be screened starting at age 27 if you are at high risk. Talk with your health care provider about your test results, treatment options, and if necessary, the need for more tests. Vaccines  Your health care provider may recommend certain vaccines, such as:  Influenza vaccine. This is recommended every year.  Tetanus, diphtheria, and acellular pertussis (Tdap, Td) vaccine. You may need a Td booster every 10 years.  Zoster vaccine. You may need this after age 90.  Pneumococcal 13-valent conjugate (PCV13) vaccine. One dose is recommended after age 16.  Pneumococcal polysaccharide (PPSV23) vaccine. One dose is recommended after age 71. Talk to your health care provider about which screenings and vaccines you need and how often you need them. This information is not intended to replace advice given to you by your health care provider. Make sure you discuss any questions you have with your health care provider. Document Released: 11/24/2015 Document Revised: 07/17/2016 Document Reviewed: 08/29/2015 Elsevier Interactive Patient Education  2017 [place] Prevention in the Home Falls can cause injuries. They can happen to people of all ages. There are many things you can do to make your home safe and to help prevent falls. What can I do on the outside of my home?  Regularly fix the edges of walkways and driveways and fix any cracks.  Remove anything that might make you trip as you walk through a door, such as a raised step or threshold.  Trim any bushes or trees on the path to your home.  Use bright outdoor  lighting.  Clear any walking paths of anything that might make someone trip, such as rocks or tools.  Regularly check to see if handrails are loose or broken. Make sure that both sides of any steps have handrails.  Any raised decks and porches should have guardrails on the edges.  Have any leaves, snow, or ice cleared regularly.  Use sand or salt on walking paths during winter.  Clean up any spills in your garage right away. This includes oil or grease spills. What can I do in the bathroom?  Use night lights.  Install grab bars by the toilet and in the tub and shower. Do not use towel bars as grab bars.  Use non-skid mats or decals in the tub or shower.  If you need to sit down in the shower, use a plastic, non-slip stool.  Keep the floor dry. Clean up any water that spills on the floor as soon as it happens.  Remove soap buildup in the tub or shower regularly.  Attach bath mats securely with double-sided non-slip rug tape.  Do not have throw rugs and other things on the floor that can make you trip. What can I do in the bedroom?  Use night lights.  Make sure that you have a light by your bed that is easy to reach.  Do not use any sheets or blankets that are too big for your bed. They should not hang down onto the floor.  Have  a firm chair that has side arms. You can use this for support while you get dressed.  Do not have throw rugs and other things on the floor that can make you trip. What can I do in the kitchen?  Clean up any spills right away.  Avoid walking on wet floors.  Keep items that you use a lot in easy-to-reach places.  If you need to reach something above you, use a strong step stool that has a grab bar.  Keep electrical cords out of the way.  Do not use floor polish or wax that makes floors slippery. If you must use wax, use non-skid floor wax.  Do not have throw rugs and other things on the floor that can make you trip. What can I do with my  stairs?  Do not leave any items on the stairs.  Make sure that there are handrails on both sides of the stairs and use them. Fix handrails that are broken or loose. Make sure that handrails are as long as the stairways.  Check any carpeting to make sure that it is firmly attached to the stairs. Fix any carpet that is loose or worn.  Avoid having throw rugs at the top or bottom of the stairs. If you do have throw rugs, attach them to the floor with carpet tape.  Make sure that you have a light switch at the top of the stairs and the bottom of the stairs. If you do not have them, ask someone to add them for you. What else can I do to help prevent falls?  Wear shoes that:  Do not have high heels.  Have rubber bottoms.  Are comfortable and fit you well.  Are closed at the toe. Do not wear sandals.  If you use a stepladder:  Make sure that it is fully opened. Do not climb a closed stepladder.  Make sure that both sides of the stepladder are locked into place.  Ask someone to hold it for you, if possible.  Clearly mark and make sure that you can see:  Any grab bars or handrails.  First and last steps.  Where the edge of each step is.  Use tools that help you move around (mobility aids) if they are needed. These include:  Canes.  Walkers.  Scooters.  Crutches.  Turn on the lights when you go into a dark area. Replace any light bulbs as soon as they burn out.  Set up your furniture so you have a clear path. Avoid moving your furniture around.  If any of your floors are uneven, fix them.  If there are any pets around you, be aware of where they are.  Review your medicines with your doctor. Some medicines can make you feel dizzy. This can increase your chance of falling. Ask your doctor what other things that you can do to help prevent falls. This information is not intended to replace advice given to you by your health care provider. Make sure you discuss any  questions you have with your health care provider. Document Released: 08/24/2009 Document Revised: 04/04/2016 Document Reviewed: 12/02/2014 Elsevier Interactive Patient Education  2017 [employer].

## 2021-04-06 NOTE — Progress Notes (Signed)
I connected with Mark Copeland today by telephone and verified that I am speaking with the correct person using two identifiers. Location patient: home Location provider: work Persons participating in the virtual visit: Derrek Puff and Lisette Abu, LPN.   I discussed the limitations, risks, security and privacy concerns of performing an evaluation and management service by telephone and the availability of in person appointments. I also discussed with the patient that there may be a patient responsible charge related to this service. The patient expressed understanding and verbally consented to this telephonic visit.    Interactive audio and video telecommunications were attempted between this provider and patient, however failed, due to patient having technical difficulties OR patient did not have access to video capability.  We continued and completed visit with audio only.  Some vital signs may be absent or patient reported.   Time Spent with patient on telephone encounter: 30 minutes  Subjective:   Mark Copeland is a 87 y.o. male who presents for Medicare Annual/Subsequent preventive examination.  Review of Systems    No ROS. Medicare Wellness Virtual Visit. Additional risk factors are reflected in social history. Cardiac Risk Factors include: advanced age (>27men, >78 women);dyslipidemia;family history of premature cardiovascular disease;hypertension;male gender Sleep Patterns: No sleep issues, feels rested on waking and sleeps 8-10 hours nightly. Home Safety/Smoke Alarms: Feels safe in home; uses home alarm. Smoke alarms in place. Living environment: 2-story home; Lives with spouse; no needs for DME; good support system. Seat Belt Safety/Bike Helmet: Wears seat belt.    Objective:    Today's Vitals   04/06/21 1122  BP: 119/67  Pulse: 70  Weight: 162 lb (73.5 kg)  Height: 5\' 10"  (1.778 m)  PainSc: 0-No pain   Body mass index is 23.24 kg/m.  Advanced  Directives 04/06/2021 06/28/2018 10/23/2011  Does Patient Have a Medical Advance Directive? Yes No Patient has advance directive, copy not in chart  Type of Advance Directive Living will;Healthcare Power of Attorney - Living will;Healthcare Power of Attorney  Does patient want to make changes to medical advance directive? No - Patient declined - -  Copy of [place] in Chart? No - copy requested - -    Current Medications (verified) Outpatient Encounter Medications as of 04/06/2021  Medication Sig  . amLODipine (NORVASC) 5 MG tablet TAKE 1 TABLET BY MOUTH  DAILY  . famotidine (PEPCID) 20 MG tablet TAKE 1 TABLET BY MOUTH AT  NIGHT  . fexofenadine (ALLEGRA) 180 MG tablet Take 180 mg by mouth daily as needed (allergies).  . fluticasone (FLONASE) 50 MCG/ACT nasal spray Place 2 sprays into the nose 2 (two) times daily as needed for rhinitis or allergies.  . Hydrocortisone-Iodoquinol 1-1 % CREA Apply 1 application topically daily.  . Multiple Vitamins-Minerals (PRESERVISION AREDS PO) Take 1 tablet by mouth daily.  Marland Kitchen telmisartan-hydrochlorothiazide (MICARDIS HCT) 40-12.5 MG tablet TAKE 1 TABLET BY MOUTH  DAILY   No facility-administered encounter medications on file as of 04/06/2021.    Allergies (verified) Nitrofurantoin, Penicillins, Sulfonamide derivatives, Amlodipine, Shellfish allergy, and Tamsulosin   History: Past Medical History:  Diagnosis Date  . BPH (benign prostatic hypertrophy)   . Colonic polyp 2011   Dr.John Amedeo Plenty  . Fasting hyperglycemia   . Hiatal hernia 2012  . Hyperlipidemia   . Interstitial cystitis    presentation in 1997 w/ abdominal pain/distention, Dr. Lawrence Santiago, [facility]  . Nephrolithiasis    x 2  . Skin cancer    basal cell, Dr.Dan  Ronnald Ramp   Past Surgical History:  Procedure Laterality Date  . COLONOSCOPY W/ POLYPECTOMY     and esophageal dilation 01/2008 by Dr.Hayes; bladder stones s/p urethral dilation 10/2007 by Dr. Amalia Hailey  . CYSTOSCOPY      2004/2011; Hospitalized 1997 w/ I.C.  . LAPAROSCOPIC CHOLECYSTECTOMY  07/14/11  . SEPTOPLASTY    . TONSILLECTOMY    . UPPER GASTROINTESTINAL ENDOSCOPY  2012   Dr Teena Irani; hiatal hernia   Family History  Problem Relation Age of Onset  . Transient ischemic attack Father 51  . Hypertension Father   . Coronary artery disease Father   . Stroke Mother 62  . Hypertension Brother   . Diabetes Neg Hx   . Cancer Neg Hx    Social History   Socioeconomic History  . Marital status: Married    Spouse name: Not on file  . Number of children: 1  . Years of education: Not on file  . Highest education level: Not on file  Occupational History  . Not on file  Tobacco Use  . Smoking status: Former Smoker    Quit date: 08/04/1960    Years since quitting: 60.7  . Smokeless tobacco: Never Used  . Tobacco comment: smoked 1958-1961, up to 2 cigars / day  Substance and Sexual Activity  . Alcohol use: Yes    Comment:  occasional wine  . Drug use: No  . Sexual activity: Not Currently  Other Topics Concern  . Not on file  Social History Narrative   Retired Electrical engineer      Social Determinants of Health   Financial Resource Strain: [place]   . Difficulty of Paying Living Expenses: Not hard at all  Food Insecurity: No Food Insecurity  . Worried About Charity fundraiser in the Last Year: Never true  . Ran Out of Food in the Last Year: Never true  Transportation Needs: No Transportation Needs  . Lack of Transportation (Medical): No  . Lack of Transportation (Non-Medical): No  Physical Activity: Sufficiently Active  . Days of Exercise per Week: 6 days  . Minutes of Exercise per Session: 30 min  Stress: No Stress Concern Present  . Feeling of Stress : Not at all  Social Connections: Socially Integrated  . Frequency of Communication with Friends and Family: More than three times a week  . Frequency of Social Gatherings with Friends and Family: More than three times a week  . Attends  Religious Services: More than 4 times per year  . Active Member of Clubs or Organizations: Yes  . Attends Archivist Meetings: More than 4 times per year  . Marital Status: Married    Tobacco Counseling Counseling given: Not Answered Comment: smoked 1958-1961, up to 2 cigars / day   Clinical Intake:  Pre-visit preparation completed: Yes  Pain : No/denies pain Pain Score: 0-No pain     BMI - recorded: 23.24 Nutritional Status: BMI of 19-24  Normal Nutritional Risks: None Diabetes: No  How often do you need to have someone help you when you read instructions, pamphlets, or other written materials from your doctor or pharmacy?: 1 - Never What is the last grade level you completed in school?: HSG  Diabetic? no  Interpreter Needed?: No  Information entered by :: Lisette Abu, LPN   Activities of Daily Living In your present state of health, do you have any difficulty performing the following activities: 04/06/2021  Hearing? N  Vision? N  Difficulty concentrating or  making decisions? N  Walking or climbing stairs? N  Dressing or bathing? N  Doing errands, shopping? N  Preparing Food and eating ? N  Using the Toilet? N  In the past six months, have you accidently leaked urine? N  Do you have problems with loss of bowel control? N  Managing your Medications? N  Managing your Finances? N  Housekeeping or managing your Housekeeping? N  Some recent data might be hidden    Patient Care Team: Binnie Rail, MD as PCP - General (Internal Medicine) O'Neal, Cassie Freer, MD as PCP - Cardiology (Cardiology) Teena Irani, MD (Inactive) as Consulting Physician (Gastroenterology) Charlton Haws, [facility] as Pharmacist (Pharmacist)  Indicate any recent Medical Services you may have received from other than [facility] providers in the past year (date may be approximate).     Assessment:   This is a routine wellness examination for [place].  Hearing/Vision  screen No exam data present  Dietary issues and exercise activities discussed: Current Exercise Habits: Home exercise routine, Type of exercise: walking;Other - see comments (bike riding 2 x week 6-8 miles, yard work, Air traffic controller, gardening, love to travel/cruising), Time (Minutes): 30, Frequency (Times/Week): 6, Weekly Exercise (Minutes/Week): 180, Intensity: Moderate, Exercise limited by: None identified  Goals Addressed            This Visit's Progress   . Patient Stated       To maintain my current health status by continuing to eat healthy, stay physically active and socially active.      Depression Screen PHQ 2/9 Scores 04/06/2021 05/31/2020 05/23/2020 05/21/2019 04/25/2017 04/25/2017 04/23/2016  PHQ - 2 Score 0 0 0 0 0 0 0  PHQ- 9 Score - 0 - 0 - - -    Fall Risk Fall Risk  04/06/2021 01/11/2020 05/21/2019 04/25/2017 04/25/2017  Falls in the past year? 0 0 0 No No  Number falls in past yr: 0 0 0 - -  Injury with Fall? 0 0 - - -  Risk for fall due to : No Fall Risks - - - -  Follow up Falls evaluation completed - - - -    FALL RISK PREVENTION PERTAINING TO THE HOME:  Any stairs in or around the home? Yes  If so, are there any without handrails? No  Home free of loose throw rugs in walkways, pet beds, electrical cords, etc? Yes  Adequate lighting in your home to reduce risk of falls? Yes   ASSISTIVE DEVICES UTILIZED TO PREVENT FALLS:  Life alert? No  Use of a cane, walker or w/c? No  Grab bars in the bathroom? Yes  Shower chair or bench in shower? No  Elevated toilet seat or a handicapped toilet? Yes   TIMED UP AND GO:  Was the test performed? No .  Length of time to ambulate 10 feet: 0 sec.   Gait steady and fast without use of assistive device  Cognitive Function: Normal cognitive status assessed by direct observation by this Nurse Health Advisor. No abnormalities found.          Immunizations Immunization History  Administered Date(s)  Administered  . Fluad Quad(high Dose 65+) 08/07/2019, 08/11/2020  . Influenza Whole 08/04/2008  . Influenza, High Dose Seasonal PF 08/08/2016, 07/09/2017, 08/13/2018  . Influenza,inj,Quad PF,6+ Mos 08/03/2015  . Influenza-Unspecified 07/28/2012  . PFIZER(Purple Top)SARS-COV-2 Vaccination 11/22/2019, 12/12/2019  . Pneumococcal Conjugate-13 04/04/2015  . Pneumococcal Polysaccharide-23 09/16/2002  . Td 07/22/2000, 10/11/2010  . Zoster Recombinat (  Shingrix) 02/20/2018, 05/15/2018  . Zoster, Live 09/24/2007    TDAP status: Due, Education has been provided regarding the importance of this vaccine. Advised may receive this vaccine at local pharmacy or Health Dept. Aware to provide a copy of the vaccination record if obtained from local pharmacy or Health Dept. Verbalized acceptance and understanding.  Flu Vaccine status: Up to date  Pneumococcal vaccine status: Up to date  Covid-19 vaccine status: Completed vaccines  Qualifies for Shingles Vaccine? Yes   Zostavax completed Yes   Shingrix Completed?: Yes  Screening Tests Health Maintenance  Topic Date Due  . COVID-19 Vaccine (3 - Booster for Pfizer series) 05/10/2020  . TETANUS/TDAP  10/11/2020  . INFLUENZA VACCINE  06/11/2021  . PNA vac Low Risk Adult  Completed  . Zoster Vaccines- Shingrix  Completed  . HPV VACCINES  Aged Out    Health Maintenance  Health Maintenance Due  Topic Date Due  . COVID-19 Vaccine (3 - Booster for Pfizer series) 05/10/2020  . TETANUS/TDAP  10/11/2020    Colorectal cancer screening: No longer required.   Lung Cancer Screening: (Low Dose CT Chest recommended if Age 75-80 years, 30 pack-year currently smoking OR have quit w/in 15years.) does not qualify.   Lung Cancer Screening Referral: no  Additional Screening:  Hepatitis C Screening: does not qualify; Completed no  Vision Screening: Recommended annual ophthalmology exams for early detection of glaucoma and other disorders of the eye. Is the  patient up to date with their annual eye exam?  Yes  Who is the provider or what is the name of the office in which the patient attends annual eye exams? [facility] If pt is not established with a provider, would they like to be referred to a provider to establish care? No .   Dental Screening: Recommended annual dental exams for proper oral hygiene  Community Resource Referral / Chronic Care Management: CRR required this visit?  No   CCM required this visit?  No      Plan:     I have personally reviewed and noted the following in the patient's chart:   . Medical and social history . Use of alcohol, tobacco or illicit drugs  . Current medications and supplements including opioid prescriptions. Patient is not currently taking opioid prescriptions. . Functional ability and status . Nutritional status . Physical activity . Advanced directives . List of other physicians . Hospitalizations, surgeries, and ER visits in previous 12 months . Vitals . Screenings to include cognitive, depression, and falls . Referrals and appointments  In addition, I have reviewed and discussed with patient certain preventive protocols, quality metrics, and best practice recommendations. A written personalized care plan for preventive services as well as general preventive health recommendations were provided to patient.     Sheral Flow, LPN   [id number]   Nurse Notes:  Patient stated that she has no issues with gait or balance; does not use any assistive devices. Patient provided his weight, blood pressure and pulse.

## 2021-05-10 DIAGNOSIS — Z23 Encounter for immunization: Secondary | ICD-10-CM | POA: Diagnosis not present

## 2021-05-13 ENCOUNTER — Encounter: Payer: Self-pay | Admitting: Internal Medicine

## 2021-06-01 ENCOUNTER — Telehealth: Payer: Self-pay | Admitting: Pharmacist

## 2021-06-01 NOTE — Chronic Care Management (AMB) (Signed)
Chronic Care Management Pharmacy Assistant   Name: Mark Copeland  MRN: [id number] DOB: [birth year]   Reason for Encounter: Disease State - General Adherence     Recent office visits:  04/06/21 - Telephone Encounter for Medicare Wellness   Recent consult visits:  None noted.   Hospital visits:  None in previous 6 months  Medications: Outpatient Encounter Medications as of 06/01/2021  Medication Sig   amLODipine (NORVASC) 5 MG tablet TAKE 1 TABLET BY MOUTH  DAILY   famotidine (PEPCID) 20 MG tablet TAKE 1 TABLET BY MOUTH AT  NIGHT   fexofenadine (ALLEGRA) 180 MG tablet Take 180 mg by mouth daily as needed (allergies).   fluticasone (FLONASE) 50 MCG/ACT nasal spray Place 2 sprays into the nose 2 (two) times daily as needed for rhinitis or allergies.   Hydrocortisone-Iodoquinol 1-1 % CREA Apply 1 application topically daily.   Multiple Vitamins-Minerals (PRESERVISION AREDS PO) Take 1 tablet by mouth daily.   telmisartan-hydrochlorothiazide (MICARDIS HCT) 40-12.5 MG tablet TAKE 1 TABLET BY MOUTH  DAILY   No facility-administered encounter medications on file as of 06/01/2021.   Recent Relevant Labs: Lab Results  Component Value Date/Time   HGBA1C 5.6 05/23/2020 08:19 AM   HGBA1C 5.9 05/21/2019 09:09 AM    Kidney Function Lab Results  Component Value Date/Time   CREATININE 1.00 05/23/2020 08:19 AM   CREATININE 1.10 05/21/2019 09:09 AM   CREATININE 1.02 06/28/2018 03:45 AM   GFR 63.92 05/21/2019 09:09 AM   GFRNONAA >60 06/28/2018 03:45 AM   GFRAA >60 06/28/2018 03:45 AM   06/01/2021 Name: Mark Copeland MRN: [id number] DOB: 27-Jul-1934 Mark Copeland is a 87 y.o. year old male who is a primary care patient of Binnie Rail, MD.  Comprehensive medication review performed; Spoke to patient regarding cholesterol  Lipid Panel    Component Value Date/Time   CHOL 188 05/23/2020 0819   TRIG 130 05/23/2020 0819   HDL 53 05/23/2020 0819   LDLCALC 110 (H) 05/23/2020  0819    Reviewed chart prior to disease state call. Spoke with patient regarding BP  Recent Office Vitals: BP Readings from Last 3 Encounters:  04/06/21 119/67  05/23/20 (!) 148/82  01/11/20 (!) 150/74   Pulse Readings from Last 3 Encounters:  04/06/21 70  05/23/20 (!) 56  01/11/20 71    Wt Readings from Last 3 Encounters:  04/06/21 162 lb (73.5 kg)  05/23/20 167 lb (75.8 kg)  01/11/20 171 lb (77.6 kg)     Kidney Function Lab Results  Component Value Date/Time   CREATININE 1.00 05/23/2020 08:19 AM   CREATININE 1.10 05/21/2019 09:09 AM   CREATININE 1.02 06/28/2018 03:45 AM   GFR 63.92 05/21/2019 09:09 AM   GFRNONAA >60 06/28/2018 03:45 AM   GFRAA >60 06/28/2018 03:45 AM    BMP Latest Ref Rng & Units 05/23/2020 05/21/2019 06/28/2018  Glucose 65 - 99 mg/dL 109(H) 113(H) 112(H)  BUN 7 - 25 mg/dL '16 18 21  '$ Creatinine 0.70 - 1.11 mg/dL 1.00 1.10 1.02  BUN/Creat Ratio 6 - 22 (calc) NOT APPLICABLE - -  Sodium A999333 - 146 mmol/L 140 140 141  Potassium 3.5 - 5.3 mmol/L 4.6 4.2 4.0  Chloride 98 - 110 mmol/L 101 101 103  CO2 20 - 32 mmol/L '29 30 28  '$ Calcium 8.6 - 10.3 mg/dL 10.1 9.8 9.3      Reviewed chart for medication changes ahead of medication coordination call.  No OVs, Consults, or hospital visits since last  care coordination call/Pharmacist visit. (If appropriate, list visit date, provider name)  No medication changes indicated OR if recent visit, treatment plan here.  BP Readings from Last 3 Encounters:  04/06/21 119/67  05/23/20 (!) 148/82  01/11/20 (!) 150/74    Lab Results  Component Value Date   HGBA1C 5.6 05/23/2020    Have you had any problems recently with your health? Patient denies any current problems with his health at this time.   Have you had any problems with your pharmacy? Patient denies having and problems getting his medications currently.   What issues or side effects are you having with your medications? Patient reports he is not having  any issues or side effects with his current medications.   What would you like me to pass along to [facility] for them to help you with?  Patient did not have anything to add at this time. Reports he is doing great and has been exercising and looks forward to his annual visit soon.   What can we do to take care of you better?  Patient did not have any recommendations at this time.    Star Rating Drugs: telmisartan-hydrochlorothiazide (MICARDIS HCT) 40-12.5 MG tablet - last filled  04/30/21 90 days     Jobe Gibbon, CCMA Clinical Pharmacist Assistant  [phone]  Time Spent:  11 min

## 2021-06-04 NOTE — Progress Notes (Signed)
Subjective:    Patient ID: Mark Copeland, male    DOB: 02/05/1934, 87 y.o.   MRN: [id number]  HPI He is here for a physical exam.   Increased stress - GD living in basement and doing drugs.  Moving to [place] at some point.  Starting to clean out house/selling things.  He feels overwhelmed at times.  At times he just sits down and cries.  He feels he is not dealing with things as well as when he was younger.   He occ gets constipated - no change in diet, water, exercise.    Medications and allergies reviewed with patient and updated if appropriate.  Patient Active Problem List   Diagnosis Date Noted   Bilateral sensorineural hearing loss 06/25/2018   Otalgia of both ears 06/18/2018   Dizziness 06/18/2018   Macular degeneration 04/27/2018   Mild aortic regurgitation 04/27/2018   Radiculopathy of leg 07/09/2017   Median nerve injury 06/17/2017   Bilateral temporomandibular joint pain 04/25/2017   Hiatal hernia, small 04/23/2016   Erectile dysfunction 05/06/2012   Bladder outlet obstruction 05/06/2012   Hypertension 11/13/2011   Abnormal chest x-ray 11/13/2011   Constipation 11/02/2010   NEPHROLITHIASIS, HX OF 10/14/2010   Prediabetes 10/11/2010   SKIN CANCER, HX OF 10/11/2010   INTENTION TREMOR 09/15/2009   BPH (benign prostatic hyperplasia) 09/15/2009   History of colonic polyps 12/05/2008   Hyperlipidemia 02/18/2008   Esophageal reflux 02/18/2008   INTERSTITIAL CYSTITIS 02/18/2008    Current Outpatient Medications on File Prior to Visit  Medication Sig Dispense Refill   amLODipine (NORVASC) 5 MG tablet TAKE 1 TABLET BY MOUTH  DAILY 90 tablet 1   famotidine (PEPCID) 20 MG tablet TAKE 1 TABLET BY MOUTH AT  NIGHT 90 tablet 3   fexofenadine (ALLEGRA) 180 MG tablet Take 180 mg by mouth daily as needed (allergies).     fluticasone (FLONASE) 50 MCG/ACT nasal spray Place 2 sprays into the nose 2 (two) times daily as needed for rhinitis or allergies.      Hydrocortisone-Iodoquinol 1-1 % CREA Apply 1 application topically daily.     Multiple Vitamins-Minerals (PRESERVISION AREDS PO) Take 1 tablet by mouth daily.     telmisartan-hydrochlorothiazide (MICARDIS HCT) 40-12.5 MG tablet TAKE 1 TABLET BY MOUTH  DAILY 90 tablet 3   No current facility-administered medications on file prior to visit.    Past Medical History:  Diagnosis Date   BPH (benign prostatic hypertrophy)    Colonic polyp 2011   Dr.John Hayes   Fasting hyperglycemia    Hiatal hernia 2012   Hyperlipidemia    Interstitial cystitis    presentation in 1997 w/ abdominal pain/distention, Dr. Lawrence Santiago, [facility]   Nephrolithiasis    x 2   Skin cancer    basal cell, Dr.Dan Ronnald Ramp    Past Surgical History:  Procedure Laterality Date   COLONOSCOPY W/ POLYPECTOMY     and esophageal dilation 01/2008 by Dr.Hayes; bladder stones s/p urethral dilation 10/2007 by Dr. Amalia Hailey   CYSTOSCOPY     2004/2011; Hospitalized 1997 w/ I.C.   LAPAROSCOPIC CHOLECYSTECTOMY  07/14/11   SEPTOPLASTY     TONSILLECTOMY     UPPER GASTROINTESTINAL ENDOSCOPY  2012   Dr Teena Irani; hiatal hernia    Social History   Socioeconomic History   Marital status: Married    Spouse name: Not on file   Number of children: 1   Years of education: Not on file   Highest education level:  Not on file  Occupational History   Not on file  Tobacco Use   Smoking status: Former    Types: Cigarettes    Quit date: 08/04/1960    Years since quitting: 60.8   Smokeless tobacco: Never   Tobacco comments:    smoked 1958-1961, up to 2 cigars / day  Substance and Sexual Activity   Alcohol use: Yes    Comment:  occasional wine   Drug use: No   Sexual activity: Not Currently  Other Topics Concern   Not on file  Social History Narrative   Retired Electrical engineer      Social Determinants of Radio broadcast assistant Strain: Not on file  Food Insecurity: No Food Insecurity   Worried About Charity fundraiser in the Last  Year: Never true   Arboriculturist in the Last Year: Never true  Transportation Needs: No Transportation Needs   Lack of Transportation (Medical): No   Lack of Transportation (Non-Medical): No  Physical Activity: Sufficiently Active   Days of Exercise per Week: 6 days   Minutes of Exercise per Session: 30 min  Stress: No Stress Concern Present   Feeling of Stress : Not at all  Social Connections: Socially Integrated   Frequency of Communication with Friends and Family: More than three times a week   Frequency of Social Gatherings with Friends and Family: More than three times a week   Attends Religious Services: More than 4 times per year   Active Member of [employer] or Organizations: Yes   Attends Music therapist: More than 4 times per year   Marital Status: Married    Family History  Problem Relation Age of Onset   Transient ischemic attack Father 1   Hypertension Father    Coronary artery disease Father    Stroke Mother 35   Hypertension Brother    Diabetes Neg Hx    Cancer Neg Hx     Review of Systems  Constitutional:  Negative for chills and fever.  Eyes:  Negative for visual disturbance.  Respiratory:  Negative for cough, shortness of breath and wheezing.   Cardiovascular:  Negative for chest pain, palpitations and leg swelling.  Gastrointestinal:  Positive for constipation (increased gas). Negative for abdominal pain, blood in stool, diarrhea and nausea.  Genitourinary:  Negative for difficulty urinating and dysuria.  Musculoskeletal:  Negative for arthralgias and back pain.  Skin:  Negative for rash.  Neurological:  Positive for dizziness (occ). Negative for headaches.  Psychiatric/Behavioral:  Positive for dysphoric mood. Negative for sleep disturbance. The patient is nervous/anxious.       Objective:   Vitals:   06/05/21 0826  BP: 130/76  Pulse: (!) 58  Temp: 98.3 F (36.8 C)  SpO2: 98%   Filed Weights   06/05/21 0826  Weight: 167 lb (75.8  kg)   Body mass index is 23.96 kg/m.  BP Readings from Last 3 Encounters:  06/05/21 130/76  04/06/21 119/67  05/23/20 (!) 148/82    Wt Readings from Last 3 Encounters:  06/05/21 167 lb (75.8 kg)  04/06/21 162 lb (73.5 kg)  05/23/20 167 lb (75.8 kg)     Physical Exam Constitutional: He appears well-developed and well-nourished. No distress.  HENT:  Head: Normocephalic and atraumatic.  Right Ear: External ear normal.  Left Ear: External ear normal.  Mouth/Throat: Oropharynx is clear and moist.  Normal ear canals and TM b/l  Eyes: Conjunctivae and EOM are normal.  Neck: Neck supple. No tracheal deviation present. No thyromegaly present.  No carotid bruit  Cardiovascular: Normal rate, regular rhythm, normal heart sounds and intact distal pulses.   No murmur heard. Pulmonary/Chest: Effort normal and breath sounds normal. No respiratory distress. He has no wheezes. He has no rales.  Abdominal: Soft. He exhibits no distension. There is no tenderness.  Genitourinary: deferred  Musculoskeletal: He exhibits no edema.  Lymphadenopathy:   He has no cervical adenopathy.  Skin: Skin is warm and dry. He is not diaphoretic.  Psychiatric: He has a normal mood and affect. His behavior is normal.         Assessment & Plan:   Physical exam: Screening blood work  ordered Immunizations  Up to date  Colonoscopy   n/a due to age Eye exams   up to date Exercise   regular - walking, biking Weight  normal Substance abuse   none Sleeping good  See Problem List for Assessment and Plan of chronic medical problems.   This visit occurred during the SARS-CoV-2 public health emergency.  Safety protocols were in place, including screening questions prior to the visit, additional usage of staff PPE, and extensive cleaning of exam room while observing appropriate contact time as indicated for disinfecting solutions.

## 2021-06-04 NOTE — Patient Instructions (Addendum)
Blood work was ordered.     Medications changes include :   start sertraline 25 mg daily  Your prescription(s) have been submitted to your pharmacy. Please take as directed and contact our office if you believe you are having problem(s) with the medication(s).  Please followup in 1 year    Health Maintenance, Male Adopting a healthy lifestyle and getting preventive care are important in promoting health and wellness. Ask your health care provider about: The right schedule for you to have regular tests and exams. Things you can do on your own to prevent diseases and keep yourself healthy. What should I know about diet, weight, and exercise? Eat a healthy diet  Eat a diet that includes plenty of vegetables, fruits, low-fat dairy products, and lean protein. Do not eat a lot of foods that are high in solid fats, added sugars, or sodium.  Maintain a healthy weight Body mass index (BMI) is a measurement that can be used to identify possible weight problems. It estimates body fat based on height and weight. Your health care provider can help determine your BMI and help you achieve or maintain ahealthy weight. Get regular exercise Get regular exercise. This is one of the most important things you can do for your health. Most adults should: Exercise for at least 150 minutes each week. The exercise should increase your heart rate and make you sweat (moderate-intensity exercise). Do strengthening exercises at least twice a week. This is in addition to the moderate-intensity exercise. Spend less time sitting. Even light physical activity can be beneficial. Watch cholesterol and blood lipids Have your blood tested for lipids and cholesterol at 85 years of age, then havethis test every 5 years. You may need to have your cholesterol levels checked more often if: Your lipid or cholesterol levels are high. You are older than 85 years of age. You are at high risk for heart disease. What should I  know about cancer screening? Many types of cancers can be detected early and may often be prevented. Depending on your health history and family history, you may need to have cancer screening at various ages. This may include screening for: Colorectal cancer. Prostate cancer. Skin cancer. Lung cancer. What should I know about heart disease, diabetes, and high blood pressure? Blood pressure and heart disease High blood pressure causes heart disease and increases the risk of stroke. This is more likely to develop in people who have high blood pressure readings, are of African descent, or are overweight. Talk with your health care provider about your target blood pressure readings. Have your blood pressure checked: Every 3-5 years if you are 74-74 years of age. Every year if you are 48 years old or older. If you are between the ages of 82 and 27 and are a current or former smoker, ask your health care provider if you should have a one-time screening for abdominal aortic aneurysm (AAA). Diabetes Have regular diabetes screenings. This checks your fasting blood sugar level. Have the screening done: Once every three years after age 46 if you are at a normal weight and have a low risk for diabetes. More often and at a younger age if you are overweight or have a high risk for diabetes. What should I know about preventing infection? Hepatitis B If you have a higher risk for hepatitis B, you should be screened for this virus. Talk with your health care provider to find out if you are at risk forhepatitis B infection. Hepatitis C Blood  testing is recommended for: Everyone born from [birth year] through 1963. Anyone with known risk factors for hepatitis C. Sexually transmitted infections (STIs) You should be screened each year for STIs, including gonorrhea and chlamydia, if: You are sexually active and are younger than 85 years of age. You are older than 85 years of age and your health care provider tells you  that you are at risk for this type of infection. Your sexual activity has changed since you were last screened, and you are at increased risk for chlamydia or gonorrhea. Ask your health care provider if you are at risk. Ask your health care provider about whether you are at high risk for HIV. Your health care provider may recommend a prescription medicine to help prevent HIV infection. If you choose to take medicine to prevent HIV, you should first get tested for HIV. You should then be tested every 3 months for as long as you are taking the medicine. Follow these instructions at home: Lifestyle Do not use any products that contain nicotine or tobacco, such as cigarettes, e-cigarettes, and chewing tobacco. If you need help quitting, ask your health care provider. Do not use street drugs. Do not share needles. Ask your health care provider for help if you need support or information about quitting drugs. Alcohol use Do not drink alcohol if your health care provider tells you not to drink. If you drink alcohol: Limit how much you have to 0-2 drinks a day. Be aware of how much alcohol is in your drink. In the [place] one drink equals one 12 oz bottle of beer (355 mL), one 5 oz glass of wine (148 mL), or one 1 oz glass of hard liquor (44 mL). General instructions Schedule regular health, dental, and eye exams. Stay current with your vaccines. Tell your health care provider if: You often feel depressed. You have ever been abused or do not feel safe at home. Summary Adopting a healthy lifestyle and getting preventive care are important in promoting health and wellness. Follow your health care provider's instructions about healthy diet, exercising, and getting tested or screened for diseases. Follow your health care provider's instructions on monitoring your cholesterol and blood pressure. This information is not intended to replace advice given to you by your health care provider. Make sure you  discuss any questions you have with your healthcare provider. Document Revised: 10/21/2018 Document Reviewed: 10/21/2018 Elsevier Patient Education  2022 [employer].

## 2021-06-05 ENCOUNTER — Other Ambulatory Visit: Payer: Self-pay

## 2021-06-05 ENCOUNTER — Encounter: Payer: Self-pay | Admitting: Internal Medicine

## 2021-06-05 ENCOUNTER — Ambulatory Visit (INDEPENDENT_AMBULATORY_CARE_PROVIDER_SITE_OTHER): Payer: Medicare Other | Admitting: Internal Medicine

## 2021-06-05 VITALS — BP 130/76 | HR 58 | Temp 98.3°F | Ht 70.0 in | Wt 167.0 lb

## 2021-06-05 DIAGNOSIS — I10 Essential (primary) hypertension: Secondary | ICD-10-CM | POA: Diagnosis not present

## 2021-06-05 DIAGNOSIS — K219 Gastro-esophageal reflux disease without esophagitis: Secondary | ICD-10-CM

## 2021-06-05 DIAGNOSIS — K5900 Constipation, unspecified: Secondary | ICD-10-CM | POA: Diagnosis not present

## 2021-06-05 DIAGNOSIS — R7303 Prediabetes: Secondary | ICD-10-CM | POA: Diagnosis not present

## 2021-06-05 DIAGNOSIS — Z0000 Encounter for general adult medical examination without abnormal findings: Secondary | ICD-10-CM | POA: Diagnosis not present

## 2021-06-05 DIAGNOSIS — F4323 Adjustment disorder with mixed anxiety and depressed mood: Secondary | ICD-10-CM

## 2021-06-05 LAB — LIPID PANEL
Cholesterol: 155 mg/dL (ref 0–200)
HDL: 48.6 mg/dL (ref >39.00)
LDL Cholesterol: 86 mg/dL (ref 0–99)
NonHDL: 106.4
Total CHOL/HDL Ratio: 3
Triglycerides: 102 mg/dL (ref 0.0–149.0)
VLDL: 20.4 mg/dL (ref 0.0–40.0)

## 2021-06-05 LAB — CBC WITH DIFFERENTIAL/PLATELET
Basophils Absolute: 0.2 10*3/uL — ABNORMAL HIGH (ref 0.0–0.1)
Basophils Relative: 1.9 % (ref 0.0–3.0)
Eosinophils Absolute: 0.7 10*3/uL (ref 0.0–0.7)
Eosinophils Relative: 7.7 % — ABNORMAL HIGH (ref 0.0–5.0)
HCT: 44.2 % (ref 39.0–52.0)
Hemoglobin: 15 g/dL (ref 13.0–17.0)
Lymphocytes Relative: 28.1 % (ref 12.0–46.0)
Lymphs Abs: 2.6 10*3/uL (ref 0.7–4.0)
MCHC: 34 g/dL (ref 30.0–36.0)
MCV: 90.1 fl (ref 78.0–100.0)
Monocytes Absolute: 0.7 10*3/uL (ref 0.1–1.0)
Monocytes Relative: 7.8 % (ref 3.0–12.0)
Neutro Abs: 5 10*3/uL (ref 1.4–7.7)
Neutrophils Relative %: 54.5 % (ref 43.0–77.0)
Platelets: 454 10*3/uL — ABNORMAL HIGH (ref 150.0–400.0)
RBC: 4.9 Mil/uL (ref 4.22–5.81)
RDW: 13.6 % (ref 11.5–15.5)
WBC: 9.2 10*3/uL (ref 4.0–10.5)

## 2021-06-05 LAB — COMPREHENSIVE METABOLIC PANEL
ALT: 13 U/L (ref 0–53)
AST: 16 U/L (ref 0–37)
Albumin: 4.5 g/dL (ref 3.5–5.2)
Alkaline Phosphatase: 56 U/L (ref 39–117)
BUN: 18 mg/dL (ref 6–23)
CO2: 31 mEq/L (ref 19–32)
Calcium: 9.9 mg/dL (ref 8.4–10.5)
Chloride: 100 mEq/L (ref 96–112)
Creatinine, Ser: 1.11 mg/dL (ref 0.40–1.50)
GFR: 60.73 mL/min (ref >60.00)
Glucose, Bld: 111 mg/dL — ABNORMAL HIGH (ref 70–99)
Potassium: 4.6 mEq/L (ref 3.5–5.1)
Sodium: 138 mEq/L (ref 135–145)
Total Bilirubin: 0.9 mg/dL (ref 0.2–1.2)
Total Protein: 6.8 g/dL (ref 6.0–8.3)

## 2021-06-05 LAB — HEMOGLOBIN A1C: Hgb A1c MFr Bld: 5.8 % (ref 4.6–6.5)

## 2021-06-05 LAB — TSH: TSH: 0.92 u[IU]/mL (ref 0.35–5.50)

## 2021-06-05 MED ORDER — SERTRALINE HCL 25 MG PO TABS: 25.0000 mg | ORAL_TABLET | Freq: Every day | ORAL | 11 refills | Status: DC

## 2021-06-05 NOTE — Assessment & Plan Note (Signed)
Chronic GERD controlled Continue pepcid 20 mg HS prn

## 2021-06-05 NOTE — Assessment & Plan Note (Signed)
Chronic Check a1c Low sugar / carb diet Stressed regular exercise  

## 2021-06-05 NOTE — Assessment & Plan Note (Signed)
New Having some mixed anxiety and depression due to stress/life changes/family issues - feeling overwhelmed and does not feel like he is handling things as well Discussed options Start sertraline 25 mg daily - discussed possible side effects He will update me on how he is doing

## 2021-06-05 NOTE — Assessment & Plan Note (Signed)
Chronic Mild - worse recently Eating healthy, drinking plenty of water and exercising Increase fiber, try a stool softener

## 2021-06-05 NOTE — Assessment & Plan Note (Signed)
Chronic BP well controlled Continue amlodipine 5 mg qd, micardis 40-12.5 mg qd cmp

## 2021-06-06 DIAGNOSIS — L570 Actinic keratosis: Secondary | ICD-10-CM | POA: Diagnosis not present

## 2021-06-06 DIAGNOSIS — D2262 Melanocytic nevi of left upper limb, including shoulder: Secondary | ICD-10-CM | POA: Diagnosis not present

## 2021-06-06 DIAGNOSIS — L821 Other seborrheic keratosis: Secondary | ICD-10-CM | POA: Diagnosis not present

## 2021-06-06 DIAGNOSIS — D2261 Melanocytic nevi of right upper limb, including shoulder: Secondary | ICD-10-CM | POA: Diagnosis not present

## 2021-06-06 DIAGNOSIS — Z85828 Personal history of other malignant neoplasm of skin: Secondary | ICD-10-CM | POA: Diagnosis not present

## 2021-06-06 DIAGNOSIS — L820 Inflamed seborrheic keratosis: Secondary | ICD-10-CM | POA: Diagnosis not present

## 2021-06-06 DIAGNOSIS — D485 Neoplasm of uncertain behavior of skin: Secondary | ICD-10-CM | POA: Diagnosis not present

## 2021-06-14 DIAGNOSIS — D487 Neoplasm of uncertain behavior of other specified sites: Secondary | ICD-10-CM | POA: Diagnosis not present

## 2021-06-14 DIAGNOSIS — D485 Neoplasm of uncertain behavior of skin: Secondary | ICD-10-CM | POA: Diagnosis not present

## 2021-06-27 ENCOUNTER — Other Ambulatory Visit: Payer: Self-pay | Admitting: Internal Medicine

## 2021-07-16 ENCOUNTER — Other Ambulatory Visit: Payer: Self-pay | Admitting: Internal Medicine

## 2021-07-16 DIAGNOSIS — I10 Essential (primary) hypertension: Secondary | ICD-10-CM

## 2021-08-13 ENCOUNTER — Other Ambulatory Visit: Payer: Self-pay

## 2021-08-13 ENCOUNTER — Ambulatory Visit (INDEPENDENT_AMBULATORY_CARE_PROVIDER_SITE_OTHER): Payer: Medicare Other

## 2021-08-13 DIAGNOSIS — Z23 Encounter for immunization: Secondary | ICD-10-CM | POA: Diagnosis not present

## 2021-10-01 ENCOUNTER — Encounter: Payer: Self-pay | Admitting: Internal Medicine

## 2021-10-01 MED ORDER — HYDROCODONE BIT-HOMATROP MBR 5-1.5 MG/5ML PO SOLN: 5.0000 mL | Freq: Four times a day (QID) | ORAL | 0 refills | Status: DC | PRN

## 2021-10-01 MED ORDER — BENZONATATE 200 MG PO CAPS: 200.0000 mg | ORAL_CAPSULE | Freq: Two times a day (BID) | ORAL | 0 refills | Status: DC | PRN

## 2021-10-11 ENCOUNTER — Encounter: Payer: Self-pay | Admitting: Internal Medicine

## 2021-10-12 MED ORDER — HYDROCODONE BIT-HOMATROP MBR 5-1.5 MG/5ML PO SOLN: 5.0000 mL | Freq: Four times a day (QID) | ORAL | 0 refills | Status: DC | PRN

## 2021-10-17 ENCOUNTER — Encounter: Payer: Self-pay | Admitting: Internal Medicine

## 2021-10-17 ENCOUNTER — Other Ambulatory Visit: Payer: Self-pay | Admitting: Internal Medicine

## 2021-10-17 DIAGNOSIS — I10 Essential (primary) hypertension: Secondary | ICD-10-CM

## 2021-11-01 ENCOUNTER — Telehealth: Payer: Self-pay

## 2021-11-01 NOTE — Progress Notes (Signed)
° ° °  Chronic Care Management Pharmacy Assistant   Name: Mark Copeland  MRN: [id number] DOB: 1934/04/17  Reason for Encounter: Disease State-General Adherence Appt. 01/09/22 @ 9:45 am  Recent office visits:  06/05/21 Burns (PCP) - Annual. Start Zoloft 25 mg prn.  Recent consult visits:  06/14/21 Sarajane Jews (Dermatology) - Neoplasm of uncertain behavior of skin.  06/06/21 Ronnald Ramp (Dermatology) - Neoplasm of uncertain behavior of skin.  Hospital visits:  None in previous 6 months  Medications: Outpatient Encounter Medications as of 11/01/2021  Medication Sig   amLODipine (NORVASC) 5 MG tablet TAKE 1 TABLET BY MOUTH  DAILY   benzonatate (TESSALON) 200 MG capsule Take 1 capsule (200 mg total) by mouth 2 (two) times daily as needed for cough.   famotidine (PEPCID) 20 MG tablet TAKE 1 TABLET BY MOUTH AT  NIGHT   fexofenadine (ALLEGRA) 180 MG tablet Take 180 mg by mouth daily as needed (allergies).   fluticasone (FLONASE) 50 MCG/ACT nasal spray Place 2 sprays into the nose 2 (two) times daily as needed for rhinitis or allergies.   HYDROcodone bit-homatropine (HYDROMET) 5-1.5 MG/5ML syrup Take 5 mLs by mouth every 6 (six) hours as needed for cough.   Hydrocortisone-Iodoquinol 1-1 % CREA Apply 1 application topically daily.   Multiple Vitamins-Minerals (PRESERVISION AREDS PO) Take 1 tablet by mouth daily.   sertraline (ZOLOFT) 25 MG tablet TAKE 1 TABLET (25 MG TOTAL) BY MOUTH DAILY.   telmisartan-hydrochlorothiazide (MICARDIS HCT) 40-12.5 MG tablet TAKE 1 TABLET BY MOUTH  DAILY   No facility-administered encounter medications on file as of 11/01/2021.   Have you had any problems recently with your health? Patient wife states that he has been feeling a little depressed lately.  Have you had any problems with your pharmacy? Patient states no problems with pharmacy.   What issues or side effects are you having with your medications? Patient states no side effects or problems.   What would  you like me to pass along to Lois Huxley, CPP for them to help you with?  Patient is requetsing a refill on Zoloft 25 mg.   What can we do to take care of you better? Patient states nothing at this time but they have a little anxiety about moving in the Spring to [place].     Care Gaps Colonoscopy - NA Diabetic Foot Exam - NA Mammogram - NA Ophthalmology - 04/04/21 Dexa Scan - NA Annual Well Visit - 06/05/21 Micro albumin - NA Hemoglobin A1c - 06/05/21  Star Rating Drugs: telmisartan-hydrochlorothiazide - 07/16/21 [place], [place] Clinical Pharmacists Assistant [phone]

## 2022-01-07 ENCOUNTER — Other Ambulatory Visit: Payer: Self-pay | Admitting: Internal Medicine

## 2022-01-07 ENCOUNTER — Telehealth: Payer: Medicare Other

## 2022-01-09 ENCOUNTER — Telehealth: Payer: Medicare Other

## 2022-02-12 ENCOUNTER — Ambulatory Visit (INDEPENDENT_AMBULATORY_CARE_PROVIDER_SITE_OTHER): Payer: Medicare Other

## 2022-02-12 DIAGNOSIS — F4323 Adjustment disorder with mixed anxiety and depressed mood: Secondary | ICD-10-CM

## 2022-02-12 DIAGNOSIS — I10 Essential (primary) hypertension: Secondary | ICD-10-CM

## 2022-02-12 NOTE — Progress Notes (Signed)
? ?Chronic Care Management ?Pharmacy Note ? ?02/12/2022 ?Name:  Mark Copeland MRN:  [id number] DOB:  1934/03/12 ? ?Summary: ?-Patient reports that since being prescribed sertraline, has been taking prn - maybe taking once weekly - reports compliance to all other prescribed medications  ?-Feels that mood/ depression has been stable as of late - does not want to take sertraline daily as he does not like the way he feels if he takes consistently  ?-BP controlled at home averaging 120/60-65 ? ?Recommendations/Changes made from today's visit: ?-Recommending for patient to hold sertraline at this time, as he is only taking once weekly, is not able to have an effect, will reduce pill burden by holding medication - patient will reach out should he feel mood/ depression is not well controlled  ? ?Plan: ?-f/u in 8-12 months  ? ?Subjective: ?Mark Copeland is an 88 y.o. year old male who is a primary patient of Burns, Claudina Lick, MD.  The CCM team was consulted for assistance with disease management and care coordination needs.   ? ?Engaged with patient by telephone for follow up visit in response to provider referral for pharmacy case management and/or care coordination services.  ? ?Consent to Services:  ?The patient was given the following information about Chronic Care Management services today, agreed to services, and gave verbal consent: 1. CCM service includes personalized support from designated clinical staff supervised by the primary care provider, including individualized plan of care and coordination with other care providers 2. 24/7 contact phone numbers for assistance for urgent and routine care needs. 3. Service will only be billed when office clinical staff spend 20 minutes or more in a month to coordinate care. 4. Only one practitioner may furnish and bill the service in a calendar month. 5.The patient may stop CCM services at any time (effective at the end of the month) by phone call to the office staff.  6. The patient will be responsible for cost sharing (co-pay) of up to 20% of the service fee (after annual deductible is met). Patient agreed to services and consent obtained. ? ?Patient Care Team: ?Binnie Rail, MD as PCP - General (Internal Medicine) ?Geralynn Rile, MD as PCP - Cardiology (Cardiology) ?Teena Irani, MD (Inactive) as Consulting Physician (Gastroenterology) ?Foltanski, Cleaster Corin, [facility] as Pharmacist (Pharmacist) ? ?Recent office visits: ?06/05/2021 - Dr. Quay Burow - start sertraline 27m daily  ? ?Recent consult visits: ?N/a ? ?Hospital visits: ?None in previous 6 months ? ?Objective: ? ?Lab Results  ?Component Value Date  ? CREATININE 1.11 06/05/2021  ? BUN 18 06/05/2021  ? GFR 60.73 06/05/2021  ? GFRNONAA >60 06/28/2018  ? GFRAA >60 06/28/2018  ? NA 138 06/05/2021  ? K 4.6 06/05/2021  ? CALCIUM 9.9 06/05/2021  ? CO2 31 06/05/2021  ? GLUCOSE 111 (H) 06/05/2021  ? ? ?Lab Results  ?Component Value Date/Time  ? HGBA1C 5.8 06/05/2021 10:31 AM  ? HGBA1C 5.6 05/23/2020 08:19 AM  ? GFR 60.73 06/05/2021 10:31 AM  ? GFR 63.92 05/21/2019 09:09 AM  ?  ?Last diabetic Eye exam:  ?No results found for: HMDIABEYEEXA  ?Last diabetic Foot exam:  ?No results found for: HMDIABFOOTEX  ? ?Lab Results  ?Component Value Date  ? CHOL 155 06/05/2021  ? HDL 48.60 06/05/2021  ? LTarrant86 06/05/2021  ? TRIG 102.0 06/05/2021  ? CHOLHDL 3 06/05/2021  ? ? ? ?  Latest Ref Rng & Units 06/05/2021  ? 10:31 AM 05/23/2020  ?  8:19 AM  05/21/2019  ?  9:09 AM  ?Hepatic Function  ?Total Protein 6.0 - 8.3 g/dL 6.8   6.8   7.1    ?Albumin 3.5 - 5.2 g/dL 4.5    4.8    ?AST 0 - 37 U/L 16   15   17    ?ALT 0 - 53 U/L 13   10   12    ?Alk Phosphatase 39 - 117 U/L 56    68    ?Total Bilirubin 0.2 - 1.2 mg/dL 0.9   0.8   0.7    ? ? ?Lab Results  ?Component Value Date/Time  ? TSH 0.92 06/05/2021 10:31 AM  ? TSH 1.41 05/23/2020 08:19 AM  ? FREET4 0.85 08/10/2013 01:38 PM  ? FREET4 0.81 04/07/2012 08:49 AM  ? ? ? ?  Latest Ref Rng & Units 06/05/2021   ? 10:31 AM 05/23/2020  ?  8:19 AM 05/21/2019  ?  9:09 AM  ?CBC  ?WBC 4.0 - 10.5 K/uL 9.2   8.7   8.4    ?Hemoglobin 13.0 - 17.0 g/dL 15.0   16.7   16.0    ?Hematocrit 39.0 - 52.0 % 44.2   50.2   48.3    ?Platelets 150.0 - 400.0 K/uL 454.0   351   319.0    ? ? ?No results found for: VD25OH ? ?Clinical ASCVD: No  ?The ASCVD Risk score (Arnett DK, et al., 2019) failed to calculate for the following reasons: ?  The 2019 ASCVD risk score is only valid for ages 40 to 79   ? ? ?  04/06/2021  ? 11:24 AM 05/31/2020  ?  3:15 PM 05/23/2020  ?  8:05 AM  ?Depression screen PHQ 2/9  ?Decreased Interest 0 0 0  ?Down, Depressed, Hopeless 0 0 0  ?PHQ - 2 Score 0 0 0  ?Altered sleeping  0   ?Tired, decreased energy  0   ?Change in appetite  0   ?Feeling bad or failure about yourself   0   ?Trouble concentrating  0   ?Moving slowly or fidgety/restless  0   ?Suicidal thoughts  0   ?PHQ-9 Score  0   ?Difficult doing work/chores  Not difficult at all   ?  ?Social History  ? ?Tobacco Use  ?Smoking Status Former  ? Types: Cigarettes  ? Quit date: 08/04/1960  ? Years since quitting: 61.5  ?Smokeless Tobacco Never  ?Tobacco Comments  ? smoked 1958-1961, up to 2 cigars / day  ? ?BP Readings from Last 3 Encounters:  ?06/05/21 130/76  ?04/06/21 119/67  ?05/23/20 (!) 148/82  ? ?Pulse Readings from Last 3 Encounters:  ?06/05/21 (!) 58  ?04/06/21 70  ?05/23/20 (!) 56  ? ?Wt Readings from Last 3 Encounters:  ?06/05/21 167 lb (75.8 kg)  ?04/06/21 162 lb (73.5 kg)  ?05/23/20 167 lb (75.8 kg)  ? ?BMI Readings from Last 3 Encounters:  ?06/05/21 23.96 kg/m?  ?04/06/21 23.24 kg/m?  ?05/23/20 23.96 kg/m?  ? ? ?Assessment/Interventions: Review of patient past medical history, allergies, medications, health status, including review of consultants reports, laboratory and other test data, was performed as part of comprehensive evaluation and provision of chronic care management services.  ? ?SDOH:  (Social Determinants of Health) assessments and interventions  performed: Yes ? ?SDOH Screenings  ? ?Alcohol Screen: Low Risk   ? Last Alcohol Screening Score (AUDIT): 4  ?Depression (PHQ2-9): Low Risk   ? PHQ-2 Score: 0  ?Financial Resource Strain: Not   on file  ?Food Insecurity: No Food Insecurity  ? Worried About Running Out of Food in the Last Year: Never true  ? Ran Out of Food in the Last Year: Never true  ?Housing: Low Risk   ? Last Housing Risk Score: 0  ?Physical Activity: Sufficiently Active  ? Days of Exercise per Week: 6 days  ? Minutes of Exercise per Session: 30 min  ?Social Connections: Socially Integrated  ? Frequency of Communication with Friends and Family: More than three times a week  ? Frequency of Social Gatherings with Friends and Family: More than three times a week  ? Attends Religious Services: More than 4 times per year  ? Active Member of Clubs or Organizations: Yes  ? Attends Club or Organization Meetings: More than 4 times per year  ? Marital Status: Married  ?Stress: No Stress Concern Present  ? Feeling of Stress : Not at all  ?Tobacco Use: Medium Risk  ? Smoking Tobacco Use: Former  ? Smokeless Tobacco Use: Never  ? Passive Exposure: Not on file  ?Transportation Needs: No Transportation Needs  ? Lack of Transportation (Medical): No  ? Lack of Transportation (Non-Medical): No  ? ? ?CCM Care Plan ? ?Allergies  ?Allergen Reactions  ? Nitrofurantoin Nausea Only  ? Penicillins   ?  Hives ?Because of a history of documented adverse serious drug reaction;Medi Alert bracelet  is recommended  ? Sulfonamide Derivatives   ?  Scrotal exfoliative rash ?Because of a history of documented adverse serious drug reaction;Medi Alert bracelet  is recommended ?  ? Amlodipine   ?  See 08/10/2013  ? Shellfish Allergy Diarrhea and Nausea And Vomiting  ?  Gi problems   ? Tamsulosin   ?  REACTION: ? caused palpitations  ? ? ?Medications Reviewed Today   ? ? Reviewed by ,  C, RPH (Pharmacist) on 02/12/22 at 0952  Med List Status: <None>  ? ?Medication Order  Taking? Sig Documenting Provider Last Dose Status Informant  ?amLODipine (NORVASC) 5 MG tablet [id number] Yes TAKE 1 TABLET BY MOUTH  DAILY Burns, Stacy J, MD Taking Active   ?famotidine (PEPCID) 20 MG tablet 34

## 2022-02-12 NOTE — Patient Instructions (Signed)
Visit Information ? ?Following are the goals we discussed today:  ? ?Manage My Medicine  ? ?Timeframe:  Long-Range Goal ?Priority:  Medium ?Start Date:   02/12/2022                          ?Expected End Date: 02/13/2023                     ? ?Follow Up Date 02/2023 ?  ?- call for medicine refill 2 or 3 days before it runs out ?- call if I am sick and can't take my medicine ?- keep a list of all the medicines I take; vitamins and herbals too ?- learn to read medicine labels  ?  ?Why is this important?   ?These steps will help you keep on track with your medicines. ? ?Plan: Telephone follow up appointment with care management team member scheduled for:  8-12 months ?The patient has been provided with contact information for the care management team and has been advised to call with any health related questions or concerns.  ? ?Tomasa Blase, PharmD ?Clinical Pharmacist, [place]  ? ?Please call the care guide team at [phone] if you need to cancel or reschedule your appointment.  ? ?Patient verbalizes understanding of instructions and care plan provided today and agrees to view in [place]. Active MyChart status confirmed with patient.   ? ?

## 2022-03-06 DIAGNOSIS — L821 Other seborrheic keratosis: Secondary | ICD-10-CM | POA: Diagnosis not present

## 2022-03-06 DIAGNOSIS — L570 Actinic keratosis: Secondary | ICD-10-CM | POA: Diagnosis not present

## 2022-03-06 DIAGNOSIS — L820 Inflamed seborrheic keratosis: Secondary | ICD-10-CM | POA: Diagnosis not present

## 2022-03-06 DIAGNOSIS — Z85828 Personal history of other malignant neoplasm of skin: Secondary | ICD-10-CM | POA: Diagnosis not present

## 2022-03-06 DIAGNOSIS — D225 Melanocytic nevi of trunk: Secondary | ICD-10-CM | POA: Diagnosis not present

## 2022-03-10 ENCOUNTER — Other Ambulatory Visit: Payer: Self-pay | Admitting: Internal Medicine

## 2022-03-10 DIAGNOSIS — F4323 Adjustment disorder with mixed anxiety and depressed mood: Secondary | ICD-10-CM | POA: Diagnosis not present

## 2022-03-10 DIAGNOSIS — I10 Essential (primary) hypertension: Secondary | ICD-10-CM | POA: Diagnosis not present

## 2022-04-09 ENCOUNTER — Ambulatory Visit (INDEPENDENT_AMBULATORY_CARE_PROVIDER_SITE_OTHER): Payer: Medicare Other

## 2022-04-09 ENCOUNTER — Telehealth: Payer: Self-pay

## 2022-04-09 DIAGNOSIS — H0102B Squamous blepharitis left eye, upper and lower eyelids: Secondary | ICD-10-CM | POA: Diagnosis not present

## 2022-04-09 DIAGNOSIS — H0102A Squamous blepharitis right eye, upper and lower eyelids: Secondary | ICD-10-CM | POA: Diagnosis not present

## 2022-04-09 DIAGNOSIS — H2513 Age-related nuclear cataract, bilateral: Secondary | ICD-10-CM | POA: Diagnosis not present

## 2022-04-09 DIAGNOSIS — Z0000 Encounter for general adult medical examination without abnormal findings: Secondary | ICD-10-CM

## 2022-04-09 DIAGNOSIS — H43813 Vitreous degeneration, bilateral: Secondary | ICD-10-CM | POA: Diagnosis not present

## 2022-04-09 DIAGNOSIS — H353131 Nonexudative age-related macular degeneration, bilateral, early dry stage: Secondary | ICD-10-CM | POA: Diagnosis not present

## 2022-04-09 NOTE — Telephone Encounter (Signed)
Also he will be going to [facility] to get his bivalent booster soon.

## 2022-04-09 NOTE — Progress Notes (Signed)
I connected with Mark Copeland today by telephone and verified that I am speaking with the correct person using two identifiers. Location patient: home Location provider: work Persons participating in the virtual visit: patient, provider.   I discussed the limitations, risks, security and privacy concerns of performing an evaluation and management service by telephone and the availability of in person appointments. I also discussed with the patient that there may be a patient responsible charge related to this service. The patient expressed understanding and verbally consented to this telephonic visit.    Interactive audio and video telecommunications were attempted between this provider and patient, however failed, due to patient having technical difficulties OR patient did not have access to video capability.  We continued and completed visit with audio only.  Some vital signs may be absent or patient reported.   Time Spent with patient on telephone encounter: 30 minutes  Subjective:   Mark Copeland is a 88 y.o. male who presents for Medicare Annual/Subsequent preventive examination.  Review of Systems     Cardiac Risk Factors include: advanced age (>79mn, >>83women);hypertension;dyslipidemia;male gender;family history of premature cardiovascular disease     Objective:    There were no vitals filed for this visit. There is no height or weight on file to calculate BMI.     04/09/2022    1:05 PM 04/06/2021   11:25 AM 06/28/2018    3:19 AM 10/23/2011   11:00 PM  Advanced Directives  Does Patient Have a Medical Advance Directive? Yes Yes No Patient has advance directive, copy not in chart  Type of Advance Directive Living will Living will;Healthcare Power of Attorney  Living will;Healthcare Power of Attorney  Does patient want to make changes to medical advance directive? No - Patient declined No - Patient declined    [place]?  No - copy  requested      Current Medications (verified) Outpatient Encounter Medications as of 04/09/2022  Medication Sig   amLODipine (NORVASC) 5 MG tablet TAKE 1 TABLET BY MOUTH  DAILY   famotidine (PEPCID) 20 MG tablet TAKE 1 TABLET BY MOUTH AT  NIGHT   fexofenadine (ALLEGRA) 180 MG tablet Take 180 mg by mouth daily as needed (allergies).   fluticasone (FLONASE) 50 MCG/ACT nasal spray Place 2 sprays into the nose 2 (two) times daily as needed for rhinitis or allergies.   Hydrocortisone-Iodoquinol 1-1 % CREA Apply 1 application topically daily.   Multiple Vitamins-Minerals (PRESERVISION AREDS PO) Take 1 tablet by mouth daily.   telmisartan-hydrochlorothiazide (MICARDIS HCT) 40-12.5 MG tablet TAKE 1 TABLET BY MOUTH  DAILY   sertraline (ZOLOFT) 25 MG tablet TAKE 1 TABLET (25 MG TOTAL) BY MOUTH DAILY. (Patient not taking: Reported on 02/12/2022)   No facility-administered encounter medications on file as of 04/09/2022.    Allergies (verified) Nitrofurantoin, Penicillins, Sulfonamide derivatives, Amlodipine, Shellfish allergy, and Tamsulosin   History: Past Medical History:  Diagnosis Date   BPH (benign prostatic hypertrophy)    Colonic polyp 2011   Dr.John Hayes   Fasting hyperglycemia    Hiatal hernia 2012   Hyperlipidemia    Interstitial cystitis    presentation in 1997 w/ abdominal pain/distention, Dr. BLawrence Santiago [facility]   Nephrolithiasis    x 2   Skin cancer    basal cell, Dr.Dan JRonnald Ramp  Past Surgical History:  Procedure Laterality Date   COLONOSCOPY W/ POLYPECTOMY     and esophageal dilation 01/2008 by Dr.Hayes; bladder stones s/p urethral dilation  10/2007 by Dr. Amalia Hailey   CYSTOSCOPY     2004/2011; Hospitalized 1997 w/ I.C.   LAPAROSCOPIC CHOLECYSTECTOMY  07/14/11   SEPTOPLASTY     TONSILLECTOMY     UPPER GASTROINTESTINAL ENDOSCOPY  2012   Dr Teena Irani; hiatal hernia   Family History  Problem Relation Age of Onset   Transient ischemic attack Father 25   Hypertension Father     Coronary artery disease Father    Stroke Mother 49   Hypertension Brother    Diabetes Neg Hx    Cancer Neg Hx    Social History   Socioeconomic History   Marital status: Married    Spouse name: Not on file   Number of children: 1   Years of education: Not on file   Highest education level: Not on file  Occupational History   Not on file  Tobacco Use   Smoking status: Former    Types: Cigarettes    Quit date: 08/04/1960    Years since quitting: 61.7   Smokeless tobacco: Never   Tobacco comments:    smoked 1958-1961, up to 2 cigars / day  Substance and Sexual Activity   Alcohol use: Yes    Comment:  occasional wine   Drug use: No   Sexual activity: Not Currently  Other Topics Concern   Not on file  Social History Narrative   Retired Electrical engineer      Social Determinants of Health   Financial Resource Strain: Low Risk    Difficulty of Paying Living Expenses: Not hard at all  Food Insecurity: No Food Insecurity   Worried About Charity fundraiser in the Last Year: Never true   Arboriculturist in the Last Year: Never true  Transportation Needs: No Transportation Needs   Lack of Transportation (Medical): No   Lack of Transportation (Non-Medical): No  Physical Activity: Sufficiently Active   Days of Exercise per Week: 7 days   Minutes of Exercise per Session: 60 min  Stress: No Stress Concern Present   Feeling of Stress : Not at all  Social Connections: Socially Integrated   Frequency of Communication with Friends and Family: More than three times a week   Frequency of Social Gatherings with Friends and Family: More than three times a week   Attends Religious Services: More than 4 times per year   Active Member of [employer] or Organizations: Yes   Attends Music therapist: More than 4 times per year   Marital Status: Married    Tobacco Counseling Counseling given: Not Answered Tobacco comments: smoked 1958-1961, up to 2 cigars / day   Clinical  Intake:  Pre-visit preparation completed: Yes  Pain : No/denies pain     BMI - recorded: 23.96 Nutritional Status: BMI of 19-24  Normal Nutritional Risks: None Diabetes: No  How often do you need to have someone help you when you read instructions, pamphlets, or other written materials from your doctor or pharmacy?: 1 - Never What is the last grade level you completed in school?: HSG; Retired Programmer, applications  Diabetic? no  Interpreter Needed?: No  Information entered by :: [employer], LPN.   Activities of Daily Living    04/09/2022    1:12 PM  In your present state of health, do you have any difficulty performing the following activities:  Hearing? 0  Vision? 0  Difficulty concentrating or making decisions? 0  Walking or climbing stairs? 0  Dressing or bathing? 0  Doing errands, shopping? 0  Preparing Food and eating ? N  Using the Toilet? N  In the past six months, have you accidently leaked urine? N  Do you have problems with loss of bowel control? N  Managing your Medications? N  Managing your Finances? N  Housekeeping or managing your Housekeeping? N    Patient Care Team: Binnie Rail, MD as PCP - General (Internal Medicine) O'Neal, Cassie Freer, MD as PCP - Cardiology (Cardiology) Teena Irani, MD (Inactive) as Consulting Physician (Gastroenterology) Charlton Haws, [facility] as Pharmacist (Pharmacist) Warden Fillers, MD as Consulting Physician (Ophthalmology)  Indicate any recent Medical Services you may have received from other than [facility] providers in the past year (date may be approximate).     Assessment:   This is a routine wellness examination for Mark Copeland.  Hearing/Vision screen Hearing Screening - Comments:: Patient denied any hearing difficulty.   No hearing aids.  Vision Screening - Comments:: Patient does wear corrective lenses/contacts.  Eye exam done by: Midge Aver, MD.   Dietary issues and exercise activities  discussed: Current Exercise Habits: Home exercise routine, Type of exercise: walking;stretching;strength training/weights;treadmill;Other - see comments (bike riding), Time (Minutes): 60, Frequency (Times/Week): 7, Weekly Exercise (Minutes/Week): 420, Intensity: Moderate, Exercise limited by: None identified   Goals Addressed   None   Depression Screen    04/09/2022    1:18 PM 04/06/2021   11:24 AM 05/31/2020    3:15 PM 05/23/2020    8:05 AM 05/21/2019    8:19 AM 04/25/2017    8:05 AM 04/25/2017    7:57 AM  PHQ 2/9 Scores  PHQ - 2 Score 0 0 0 0 0 0 0  PHQ- 9 Score   0  0      Fall Risk    04/09/2022    1:10 PM 04/06/2021   11:25 AM 01/11/2020    1:30 PM 05/21/2019    8:19 AM 04/25/2017    8:05 AM  [place] in the past year? 0 0 0 0 No  Number falls in past yr: 0 0 0 0   Injury with Fall? 0 0 0    Risk for fall due to : No Fall Risks No Fall Risks     Follow up Falls evaluation completed Falls evaluation completed       [place]:  Any stairs in or around the home? Yes  If so, are there any without handrails? No  Home free of loose throw rugs in walkways, pet beds, electrical cords, etc? Yes  Adequate lighting in your home to reduce risk of falls? Yes   ASSISTIVE DEVICES UTILIZED TO PREVENT FALLS:  Life alert? No  Use of a cane, walker or w/c? No  Grab bars in the bathroom? Yes  Shower chair or bench in shower? Yes  Elevated toilet seat or a handicapped toilet? Yes   TIMED UP AND GO:  Was the test performed? No .  Length of time to ambulate 10 feet: n/a sec.   Appearance of gait: Patient not evaluated for gait during this visit.  Cognitive Function:        04/09/2022    1:18 PM  6CIT Screen  What Year? 0 points  What month? 0 points  What time? 0 points  Count back from 20 0 points  Months in reverse 0 points  Repeat phrase 0 points  Total Score 0 points    Immunizations Immunization History  Administered Date(s)  Administered   Fluad Quad(high Dose 65+) 08/07/2019, 08/11/2020, 08/13/2021   Influenza Whole 08/04/2008   Influenza, High Dose Seasonal PF 08/08/2016, 07/09/2017, 08/13/2018   Influenza,inj,Quad PF,6+ Mos 08/03/2015   Influenza-Unspecified 07/28/2012   PFIZER(Purple Top)SARS-COV-2 Vaccination 11/22/2019, 12/12/2019, 08/29/2020, 02/13/2021, 07/30/2021   Pneumococcal Conjugate-13 04/04/2015   Pneumococcal Polysaccharide-23 09/16/2002   Td 07/22/2000, 10/11/2010   Tdap 05/10/2021   Zoster Recombinat (Shingrix) 02/20/2018, 05/15/2018   Zoster, Live 09/24/2007    TDAP status: Up to date  Flu Vaccine status: Up to date  Pneumococcal vaccine status: Up to date  Covid-19 vaccine status: Completed vaccines  Qualifies for Shingles Vaccine? Yes   Zostavax completed Yes   Shingrix Completed?: Yes  Screening Tests Health Maintenance  Topic Date Due   INFLUENZA VACCINE  06/11/2022   TETANUS/TDAP  05/11/2031   Pneumonia Vaccine 21+ Years old  Completed   COVID-19 Vaccine  Completed   Zoster Vaccines- Shingrix  Completed   HPV VACCINES  Aged Out    Health Maintenance  There are no preventive care reminders to display for this patient.  Colorectal cancer screening: No longer required.   Lung Cancer Screening: (Low Dose CT Chest recommended if Age 68-80 years, 30 pack-year currently smoking OR have quit w/in 15years.) does not qualify.   Lung Cancer Screening Referral: no  Additional Screening:  Hepatitis C Screening: does not qualify; Completed no  Vision Screening: Recommended annual ophthalmology exams for early detection of glaucoma and other disorders of the eye. Is the patient up to date with their annual eye exam?  Yes  Who is the provider or what is the name of the office in which the patient attends annual eye exams? Warden Fillers, MD. If pt is not established with a provider, would they like to be referred to a provider to establish care? No .   Dental Screening:  Recommended annual dental exams for proper oral hygiene  Community Resource Referral / Chronic Care Management: CRR required this visit?  No   CCM required this visit?  No      Plan:     I have personally reviewed and noted the following in the patient's chart:   Medical and social history Use of alcohol, tobacco or illicit drugs  Current medications and supplements including opioid prescriptions. Patient is not currently taking opioid prescriptions. Functional ability and status Nutritional status Physical activity Advanced directives List of other physicians Hospitalizations, surgeries, and ER visits in previous 12 months Vitals Screenings to include cognitive, depression, and falls Referrals and appointments  In addition, I have reviewed and discussed with patient certain preventive protocols, quality metrics, and best practice recommendations. A written personalized care plan for preventive services as well as general preventive health recommendations were provided to patient.     Sheral Flow, LPN   [id number]/06/6760   Nurse Notes:  There were no vitals filed for this visit. There is no height or weight on file to calculate BMI. Patient stated that he has no issues with gait or balance; does not use any assistive devices.

## 2022-04-09 NOTE — Telephone Encounter (Signed)
Just FYI.... Patient is having an inner ear problem with loud ringing in the ears and unsteadiness.  Has been going on for the last 2 months.

## 2022-04-09 NOTE — Patient Instructions (Signed)
Mark Copeland , Thank you for taking time to come for your Medicare Wellness Visit. I appreciate your ongoing commitment to your health goals. Please review the following plan we discussed and let me know if I can assist you in the future.   Screening recommendations/referrals: Colonoscopy: Discontinued due to age Recommended yearly ophthalmology/optometry visit for glaucoma screening and checkup Recommended yearly dental visit for hygiene and checkup  Vaccinations: Influenza vaccine: 08/13/2021 Pneumococcal vaccine: 09/16/2002, 04/04/2015 Tdap vaccine: 05/10/2021 Shingles vaccine: 02/20/2018, 05/15/2018   Covid-19: 11/22/2019, 12/12/2019, 08/29/2020, 02/13/2021, 07/30/2021  Advanced directives: Yes; Please bring a copy of your health care power of attorney and living will to the office at your convenience.  Conditions/risks identified: Yes  Next appointment: Please schedule your next Medicare Wellness Visit with your Nurse Health Advisor in 1 year by calling [phone].  Preventive Care 25 Years and Older, Male Preventive care refers to lifestyle choices and visits with your health care provider that can promote health and wellness. What does preventive care include? A yearly physical exam. This is also called an annual well check. Dental exams once or twice a year. Routine eye exams. Ask your health care provider how often you should have your eyes checked. Personal lifestyle choices, including: Daily care of your teeth and gums. Regular physical activity. Eating a healthy diet. Avoiding tobacco and drug use. Limiting alcohol use. Practicing safe sex. Taking low doses of aspirin every day. Taking vitamin and mineral supplements as recommended by your health care provider. What happens during an annual well check? The services and screenings done by your health care provider during your annual well check will depend on your age, overall health, lifestyle risk factors, and family history of  disease. Counseling  Your health care provider may ask you questions about your: Alcohol use. Tobacco use. Drug use. Emotional well-being. Home and relationship well-being. Sexual activity. Eating habits. History of falls. Memory and ability to understand (cognition). Work and work Statistician. Screening  You may have the following tests or measurements: Height, weight, and BMI. Blood pressure. Lipid and cholesterol levels. These may be checked every 5 years, or more frequently if you are over 46 years old. Skin check. Lung cancer screening. You may have this screening every year starting at age 63 if you have a 30-pack-year history of smoking and currently smoke or have quit within the past 15 years. Fecal occult blood test (FOBT) of the stool. You may have this test every year starting at age 30. Flexible sigmoidoscopy or colonoscopy. You may have a sigmoidoscopy every 5 years or a colonoscopy every 10 years starting at age 69. Prostate cancer screening. Recommendations will vary depending on your family history and other risks. Hepatitis C blood test. Hepatitis B blood test. Sexually transmitted disease (STD) testing. Diabetes screening. This is done by checking your blood sugar (glucose) after you have not eaten for a while (fasting). You may have this done every 1-3 years. Abdominal aortic aneurysm (AAA) screening. You may need this if you are a current or former smoker. Osteoporosis. You may be screened starting at age 41 if you are at high risk. Talk with your health care provider about your test results, treatment options, and if necessary, the need for more tests. Vaccines  Your health care provider may recommend certain vaccines, such as: Influenza vaccine. This is recommended every year. Tetanus, diphtheria, and acellular pertussis (Tdap, Td) vaccine. You may need a Td booster every 10 years. Zoster vaccine. You may need this after age  60. Pneumococcal 13-valent  conjugate (PCV13) vaccine. One dose is recommended after age 74. Pneumococcal polysaccharide (PPSV23) vaccine. One dose is recommended after age 41. Talk to your health care provider about which screenings and vaccines you need and how often you need them. This information is not intended to replace advice given to you by your health care provider. Make sure you discuss any questions you have with your health care provider. Document Released: 11/24/2015 Document Revised: 07/17/2016 Document Reviewed: 08/29/2015 Elsevier Interactive Patient Education  2017 [place] Prevention in the Home Falls can cause injuries. They can happen to people of all ages. There are many things you can do to make your home safe and to help prevent falls. What can I do on the outside of my home? Regularly fix the edges of walkways and driveways and fix any cracks. Remove anything that might make you trip as you walk through a door, such as a raised step or threshold. Trim any bushes or trees on the path to your home. Use bright outdoor lighting. Clear any walking paths of anything that might make someone trip, such as rocks or tools. Regularly check to see if handrails are loose or broken. Make sure that both sides of any steps have handrails. Any raised decks and porches should have guardrails on the edges. Have any leaves, snow, or ice cleared regularly. Use sand or salt on walking paths during winter. Clean up any spills in your garage right away. This includes oil or grease spills. What can I do in the bathroom? Use night lights. Install grab bars by the toilet and in the tub and shower. Do not use towel bars as grab bars. Use non-skid mats or decals in the tub or shower. If you need to sit down in the shower, use a plastic, non-slip stool. Keep the floor dry. Clean up any water that spills on the floor as soon as it happens. Remove soap buildup in the tub or shower regularly. Attach bath mats  securely with double-sided non-slip rug tape. Do not have throw rugs and other things on the floor that can make you trip. What can I do in the bedroom? Use night lights. Make sure that you have a light by your bed that is easy to reach. Do not use any sheets or blankets that are too big for your bed. They should not hang down onto the floor. Have a firm chair that has side arms. You can use this for support while you get dressed. Do not have throw rugs and other things on the floor that can make you trip. What can I do in the kitchen? Clean up any spills right away. Avoid walking on wet floors. Keep items that you use a lot in easy-to-reach places. If you need to reach something above you, use a strong step stool that has a grab bar. Keep electrical cords out of the way. Do not use floor polish or wax that makes floors slippery. If you must use wax, use non-skid floor wax. Do not have throw rugs and other things on the floor that can make you trip. What can I do with my stairs? Do not leave any items on the stairs. Make sure that there are handrails on both sides of the stairs and use them. Fix handrails that are broken or loose. Make sure that handrails are as long as the stairways. Check any carpeting to make sure that it is firmly attached to the stairs. Fix  any carpet that is loose or worn. Avoid having throw rugs at the top or bottom of the stairs. If you do have throw rugs, attach them to the floor with carpet tape. Make sure that you have a light switch at the top of the stairs and the bottom of the stairs. If you do not have them, ask someone to add them for you. What else can I do to help prevent falls? Wear shoes that: Do not have high heels. Have rubber bottoms. Are comfortable and fit you well. Are closed at the toe. Do not wear sandals. If you use a stepladder: Make sure that it is fully opened. Do not climb a closed stepladder. Make sure that both sides of the stepladder  are locked into place. Ask someone to hold it for you, if possible. Clearly mark and make sure that you can see: Any grab bars or handrails. First and last steps. Where the edge of each step is. Use tools that help you move around (mobility aids) if they are needed. These include: Canes. Walkers. Scooters. Crutches. Turn on the lights when you go into a dark area. Replace any light bulbs as soon as they burn out. Set up your furniture so you have a clear path. Avoid moving your furniture around. If any of your floors are uneven, fix them. If there are any pets around you, be aware of where they are. Review your medicines with your doctor. Some medicines can make you feel dizzy. This can increase your chance of falling. Ask your doctor what other things that you can do to help prevent falls. This information is not intended to replace advice given to you by your health care provider. Make sure you discuss any questions you have with your health care provider. Document Released: 08/24/2009 Document Revised: 04/04/2016 Document Reviewed: 12/02/2014 Elsevier Interactive Patient Education  2017 [employer].

## 2022-05-05 ENCOUNTER — Other Ambulatory Visit: Payer: Self-pay | Admitting: Internal Medicine

## 2022-05-05 DIAGNOSIS — I10 Essential (primary) hypertension: Secondary | ICD-10-CM

## 2022-06-06 NOTE — Patient Instructions (Addendum)
Blood work was ordered.     Medications changes include :   none   Your prescription(s) have been sent to your pharmacy.    Return in about 1 year (around 06/08/2023) for Physical Exam.   Health Maintenance, Male Adopting a healthy lifestyle and getting preventive care are important in promoting health and wellness. Ask your health care provider about: The right schedule for you to have regular tests and exams. Things you can do on your own to prevent diseases and keep yourself healthy. What should I know about diet, weight, and exercise? Eat a healthy diet  Eat a diet that includes plenty of vegetables, fruits, low-fat dairy products, and lean protein. Do not eat a lot of foods that are high in solid fats, added sugars, or sodium. Maintain a healthy weight Body mass index (BMI) is a measurement that can be used to identify possible weight problems. It estimates body fat based on height and weight. Your health care provider can help determine your BMI and help you achieve or maintain a healthy weight. Get regular exercise Get regular exercise. This is one of the most important things you can do for your health. Most adults should: Exercise for at least 150 minutes each week. The exercise should increase your heart rate and make you sweat (moderate-intensity exercise). Do strengthening exercises at least twice a week. This is in addition to the moderate-intensity exercise. Spend less time sitting. Even light physical activity can be beneficial. Watch cholesterol and blood lipids Have your blood tested for lipids and cholesterol at 86 years of age, then have this test every 5 years. You may need to have your cholesterol levels checked more often if: Your lipid or cholesterol levels are high. You are older than 86 years of age. You are at high risk for heart disease. What should I know about cancer screening? Many types of cancers can be detected early and may often be  prevented. Depending on your health history and family history, you may need to have cancer screening at various ages. This may include screening for: Colorectal cancer. Prostate cancer. Skin cancer. Lung cancer. What should I know about heart disease, diabetes, and high blood pressure? Blood pressure and heart disease High blood pressure causes heart disease and increases the risk of stroke. This is more likely to develop in people who have high blood pressure readings or are overweight. Talk with your health care provider about your target blood pressure readings. Have your blood pressure checked: Every 3-5 years if you are 54-71 years of age. Every year if you are 67 years old or older. If you are between the ages of 54 and 39 and are a current or former smoker, ask your health care provider if you should have a one-time screening for abdominal aortic aneurysm (AAA). Diabetes Have regular diabetes screenings. This checks your fasting blood sugar level. Have the screening done: Once every three years after age 68 if you are at a normal weight and have a low risk for diabetes. More often and at a younger age if you are overweight or have a high risk for diabetes. What should I know about preventing infection? Hepatitis B If you have a higher risk for hepatitis B, you should be screened for this virus. Talk with your health care provider to find out if you are at risk for hepatitis B infection. Hepatitis C Blood testing is recommended for: Everyone born from [birth year] through 1963. Anyone with known risk  factors for hepatitis C. Sexually transmitted infections (STIs) You should be screened each year for STIs, including gonorrhea and chlamydia, if: You are sexually active and are younger than 86 years of age. You are older than 86 years of age and your health care provider tells you that you are at risk for this type of infection. Your sexual activity has changed since you were last screened,  and you are at increased risk for chlamydia or gonorrhea. Ask your health care provider if you are at risk. Ask your health care provider about whether you are at high risk for HIV. Your health care provider may recommend a prescription medicine to help prevent HIV infection. If you choose to take medicine to prevent HIV, you should first get tested for HIV. You should then be tested every 3 months for as long as you are taking the medicine. Follow these instructions at home: Alcohol use Do not drink alcohol if your health care provider tells you not to drink. If you drink alcohol: Limit how much you have to 0-2 drinks a day. Know how much alcohol is in your drink. In the [place] one drink equals one 12 oz bottle of beer (355 mL), one 5 oz glass of wine (148 mL), or one 1 oz glass of hard liquor (44 mL). Lifestyle Do not use any products that contain nicotine or tobacco. These products include cigarettes, chewing tobacco, and vaping devices, such as e-cigarettes. If you need help quitting, ask your health care provider. Do not use street drugs. Do not share needles. Ask your health care provider for help if you need support or information about quitting drugs. General instructions Schedule regular health, dental, and eye exams. Stay current with your vaccines. Tell your health care provider if: You often feel depressed. You have ever been abused or do not feel safe at home. Summary Adopting a healthy lifestyle and getting preventive care are important in promoting health and wellness. Follow your health care provider's instructions about healthy diet, exercising, and getting tested or screened for diseases. Follow your health care provider's instructions on monitoring your cholesterol and blood pressure. This information is not intended to replace advice given to you by your health care provider. Make sure you discuss any questions you have with your health care provider. Document Revised:  03/19/2021 Document Reviewed: 03/19/2021 Elsevier Patient Education  [place].

## 2022-06-06 NOTE — Progress Notes (Signed)
Subjective:    Patient ID: Mark Copeland, male    DOB: 1934-07-08, 88 y.o.   MRN: [id number]     HPI Fredie is here for a physical exam.   Intermittent tinnitus - ? stress   Was drinking one glass of wine but stopped.   Has been losing a little weight - no change in diet or exercise.    Medications and allergies reviewed with patient and updated if appropriate.  Current Outpatient Medications on File Prior to Visit  Medication Sig Dispense Refill   famotidine (PEPCID) 20 MG tablet TAKE 1 TABLET BY MOUTH AT  NIGHT 90 tablet 3   fexofenadine (ALLEGRA) 180 MG tablet Take 180 mg by mouth daily as needed (allergies).     fluticasone (FLONASE) 50 MCG/ACT nasal spray Place 2 sprays into the nose 2 (two) times daily as needed for rhinitis or allergies.     Hydrocortisone-Iodoquinol 1-1 % CREA Apply 1 application topically daily.     Multiple Vitamins-Minerals (PRESERVISION AREDS PO) Take 1 tablet by mouth daily.     sertraline (ZOLOFT) 25 MG tablet TAKE 1 TABLET (25 MG TOTAL) BY MOUTH DAILY. 90 tablet 4   No current facility-administered medications on file prior to visit.    Review of Systems  Constitutional:  Negative for chills and fever.  HENT:  Positive for hearing loss (mild) and tinnitus.   Eyes:  Negative for visual disturbance.  Respiratory:  Negative for cough, shortness of breath and wheezing.   Cardiovascular:  Negative for chest pain, palpitations and leg swelling.  Gastrointestinal:  Negative for abdominal pain, blood in stool, constipation, diarrhea and nausea.       No gerd  Genitourinary:  Negative for difficulty urinating, dysuria and hematuria.  Musculoskeletal:  Negative for arthralgias and back pain.  Skin:  Negative for rash.  Neurological:  Positive for light-headedness (goes with tinnitus). Negative for headaches.  Psychiatric/Behavioral:  Positive for dysphoric mood (mild at times- related to family issues - only once in a while). The patient  is not nervous/anxious.        Objective:   Vitals:   06/07/22 0811  BP: 118/74  Pulse: (!) 58  Temp: 97.9 F (36.6 C)  SpO2: 97%   Filed Weights   06/07/22 0811  Weight: 163 lb (73.9 kg)   Body mass index is 23.39 kg/m.  BP Readings from Last 3 Encounters:  06/07/22 118/74  06/05/21 130/76  04/06/21 119/67    Wt Readings from Last 3 Encounters:  06/07/22 163 lb (73.9 kg)  06/05/21 167 lb (75.8 kg)  04/06/21 162 lb (73.5 kg)      Physical Exam Constitutional: He appears well-developed and well-nourished. No distress.  HENT:  Head: Normocephalic and atraumatic.  Right Ear: External ear normal.  Left Ear: External ear normal.  Mouth/Throat: Oropharynx is clear and moist.  Normal ear canals and TM b/l  Eyes: Conjunctivae and EOM are normal.  Neck: Neck supple. No tracheal deviation present. No thyromegaly present.  No carotid bruit  Cardiovascular: Normal rate, regular rhythm, normal heart sounds and intact distal pulses.   No murmur heard. Pulmonary/Chest: Effort normal and breath sounds normal. No respiratory distress. He has no wheezes. He has no rales.  Abdominal: Soft. He exhibits no distension. There is no tenderness.  Genitourinary: deferred  Musculoskeletal: He exhibits no edema.  Lymphadenopathy:   He has no cervical adenopathy.  Skin: Skin is warm and dry. He is not diaphoretic.  Psychiatric: He has  a normal mood and affect. His behavior is normal.         Assessment & Plan:   Physical exam: Screening blood work  ordered Exercise   walking regularly, rides bike Weight  normal Substance abuse   none   Reviewed recommended immunizations.   Health Maintenance  Topic Date Due   COVID-19 Vaccine (6 - Pfizer series) 06/22/2022 (Originally 09/24/2021)   INFLUENZA VACCINE  06/11/2022   TETANUS/TDAP  05/11/2031   Pneumonia Vaccine 76+ Years old  Completed   Zoster Vaccines- Shingrix  Completed   HPV VACCINES  Aged Out     See Problem  List for Assessment and Plan of chronic medical problems.

## 2022-06-07 ENCOUNTER — Ambulatory Visit (INDEPENDENT_AMBULATORY_CARE_PROVIDER_SITE_OTHER): Payer: Medicare Other | Admitting: Internal Medicine

## 2022-06-07 ENCOUNTER — Encounter: Payer: Self-pay | Admitting: Internal Medicine

## 2022-06-07 VITALS — BP 118/74 | HR 58 | Temp 97.9°F | Ht 70.0 in | Wt 163.0 lb

## 2022-06-07 DIAGNOSIS — R7303 Prediabetes: Secondary | ICD-10-CM | POA: Diagnosis not present

## 2022-06-07 DIAGNOSIS — K219 Gastro-esophageal reflux disease without esophagitis: Secondary | ICD-10-CM

## 2022-06-07 DIAGNOSIS — I10 Essential (primary) hypertension: Secondary | ICD-10-CM

## 2022-06-07 DIAGNOSIS — Z0000 Encounter for general adult medical examination without abnormal findings: Secondary | ICD-10-CM

## 2022-06-07 DIAGNOSIS — E782 Mixed hyperlipidemia: Secondary | ICD-10-CM

## 2022-06-07 DIAGNOSIS — D75839 Thrombocytosis, unspecified: Secondary | ICD-10-CM | POA: Diagnosis not present

## 2022-06-07 DIAGNOSIS — H9319 Tinnitus, unspecified ear: Secondary | ICD-10-CM | POA: Insufficient documentation

## 2022-06-07 DIAGNOSIS — H9313 Tinnitus, bilateral: Secondary | ICD-10-CM

## 2022-06-07 DIAGNOSIS — F4323 Adjustment disorder with mixed anxiety and depressed mood: Secondary | ICD-10-CM

## 2022-06-07 LAB — CBC WITH DIFFERENTIAL/PLATELET
Basophils Absolute: 0.2 10*3/uL — ABNORMAL HIGH (ref 0.0–0.1)
Basophils Relative: 1.7 % (ref 0.0–3.0)
Eosinophils Absolute: 0.6 10*3/uL (ref 0.0–0.7)
Eosinophils Relative: 5.1 % — ABNORMAL HIGH (ref 0.0–5.0)
HCT: 44.3 % (ref 39.0–52.0)
Hemoglobin: 14.4 g/dL (ref 13.0–17.0)
Lymphocytes Relative: 25.4 % (ref 12.0–46.0)
Lymphs Abs: 2.9 10*3/uL (ref 0.7–4.0)
MCHC: 32.6 g/dL (ref 30.0–36.0)
MCV: 88.9 fl (ref 78.0–100.0)
Monocytes Absolute: 0.6 10*3/uL (ref 0.1–1.0)
Monocytes Relative: 5.6 % (ref 3.0–12.0)
Neutro Abs: 7.2 10*3/uL (ref 1.4–7.7)
Neutrophils Relative %: 62.2 % (ref 43.0–77.0)
Platelets: 607 10*3/uL — ABNORMAL HIGH (ref 150.0–400.0)
RBC: 4.98 Mil/uL (ref 4.22–5.81)
RDW: 14.1 % (ref 11.5–15.5)
WBC: 11.5 10*3/uL — ABNORMAL HIGH (ref 4.0–10.5)

## 2022-06-07 LAB — LIPID PANEL
Cholesterol: 156 mg/dL (ref 0–200)
HDL: 45.6 mg/dL (ref >39.00)
LDL Cholesterol: 91 mg/dL (ref 0–99)
NonHDL: 110.54
Total CHOL/HDL Ratio: 3
Triglycerides: 99 mg/dL (ref 0.0–149.0)
VLDL: 19.8 mg/dL (ref 0.0–40.0)

## 2022-06-07 LAB — COMPREHENSIVE METABOLIC PANEL
ALT: 14 U/L (ref 0–53)
AST: 19 U/L (ref 0–37)
Albumin: 4.7 g/dL (ref 3.5–5.2)
Alkaline Phosphatase: 57 U/L (ref 39–117)
BUN: 22 mg/dL (ref 6–23)
CO2: 27 mEq/L (ref 19–32)
Calcium: 9.8 mg/dL (ref 8.4–10.5)
Chloride: 103 mEq/L (ref 96–112)
Creatinine, Ser: 1.07 mg/dL (ref 0.40–1.50)
GFR: 63.02 mL/min (ref >60.00)
Glucose, Bld: 102 mg/dL — ABNORMAL HIGH (ref 70–99)
Potassium: 4 mEq/L (ref 3.5–5.1)
Sodium: 139 mEq/L (ref 135–145)
Total Bilirubin: 0.8 mg/dL (ref 0.2–1.2)
Total Protein: 6.9 g/dL (ref 6.0–8.3)

## 2022-06-07 LAB — HEMOGLOBIN A1C: Hgb A1c MFr Bld: 6.1 % (ref 4.6–6.5)

## 2022-06-07 MED ORDER — AMLODIPINE BESYLATE 5 MG PO TABS: 5.0000 mg | ORAL_TABLET | Freq: Every day | ORAL | 3 refills | Status: DC

## 2022-06-07 MED ORDER — TELMISARTAN-HCTZ 40-12.5 MG PO TABS: 1.0000 | ORAL_TABLET | Freq: Every day | ORAL | 3 refills | Status: DC

## 2022-06-07 NOTE — Assessment & Plan Note (Addendum)
Chronic Controlled, Stable Taking sertraline 25 mg daily as needed

## 2022-06-07 NOTE — Assessment & Plan Note (Addendum)
Chronic Regular exercise and healthy diet encouraged Check lipid panel  Continue diet control 

## 2022-06-07 NOTE — Assessment & Plan Note (Signed)
Chronic No dysphagia GERD controlled Continue Pepcid 20 mg daily

## 2022-06-07 NOTE — Assessment & Plan Note (Signed)
New Mild, intermittent Normal ear exam possibly related to hearing loss Symptoms not severe enough at this time for ENT referral - he will monitor

## 2022-06-07 NOTE — Assessment & Plan Note (Signed)
Chronic Check a1c Low sugar / carb diet Stressed regular exercise  

## 2022-06-07 NOTE — Assessment & Plan Note (Signed)
Chronic Blood pressure well controlled CMP Continue amlodipine 5 mg daily, Micardis HCT 40-12.5 mg daily

## 2022-06-08 DIAGNOSIS — D75839 Thrombocytosis, unspecified: Secondary | ICD-10-CM | POA: Insufficient documentation

## 2022-06-08 NOTE — Addendum Note (Signed)
Addended by: Binnie Rail on: 06/08/2022 09:13 AM   Modules accepted: Orders

## 2022-06-18 ENCOUNTER — Other Ambulatory Visit (INDEPENDENT_AMBULATORY_CARE_PROVIDER_SITE_OTHER): Payer: Medicare Other

## 2022-06-18 DIAGNOSIS — D75839 Thrombocytosis, unspecified: Secondary | ICD-10-CM

## 2022-06-18 LAB — CBC WITH DIFFERENTIAL/PLATELET
Basophils Absolute: 0.2 10*3/uL — ABNORMAL HIGH (ref 0.0–0.1)
Basophils Relative: 1.3 % (ref 0.0–3.0)
Eosinophils Absolute: 0.8 10*3/uL — ABNORMAL HIGH (ref 0.0–0.7)
Eosinophils Relative: 6.4 % — ABNORMAL HIGH (ref 0.0–5.0)
HCT: 43.9 % (ref 39.0–52.0)
Hemoglobin: 14.3 g/dL (ref 13.0–17.0)
Lymphocytes Relative: 26.1 % (ref 12.0–46.0)
Lymphs Abs: 3.3 10*3/uL (ref 0.7–4.0)
MCHC: 32.6 g/dL (ref 30.0–36.0)
MCV: 88.7 fl (ref 78.0–100.0)
Monocytes Absolute: 0.7 10*3/uL (ref 0.1–1.0)
Monocytes Relative: 5.6 % (ref 3.0–12.0)
Neutro Abs: 7.7 10*3/uL (ref 1.4–7.7)
Neutrophils Relative %: 60.6 % (ref 43.0–77.0)
Platelets: 592 10*3/uL — ABNORMAL HIGH (ref 150.0–400.0)
RBC: 4.95 Mil/uL (ref 4.22–5.81)
RDW: 14.2 % (ref 11.5–15.5)
WBC: 12.7 10*3/uL — ABNORMAL HIGH (ref 4.0–10.5)

## 2022-06-20 ENCOUNTER — Encounter: Payer: Self-pay | Admitting: Internal Medicine

## 2022-06-20 ENCOUNTER — Telehealth: Payer: Self-pay

## 2022-06-20 NOTE — Telephone Encounter (Signed)
Pt wife is calling for the results from Blood work taking on 06/18/22.  Pt wife is requesting a call back [phone]

## 2022-06-21 NOTE — Telephone Encounter (Signed)
Spoke with wife and they saw results via my-chart.

## 2022-07-17 ENCOUNTER — Other Ambulatory Visit (INDEPENDENT_AMBULATORY_CARE_PROVIDER_SITE_OTHER): Payer: Medicare Other

## 2022-07-17 DIAGNOSIS — D75839 Thrombocytosis, unspecified: Secondary | ICD-10-CM | POA: Diagnosis not present

## 2022-07-17 LAB — CBC WITH DIFFERENTIAL/PLATELET
Basophils Absolute: 0.2 10*3/uL — ABNORMAL HIGH (ref 0.0–0.1)
Basophils Relative: 1.4 % (ref 0.0–3.0)
Eosinophils Absolute: 0.8 10*3/uL — ABNORMAL HIGH (ref 0.0–0.7)
Eosinophils Relative: 6.5 % — ABNORMAL HIGH (ref 0.0–5.0)
HCT: 42.6 % (ref 39.0–52.0)
Hemoglobin: 14.3 g/dL (ref 13.0–17.0)
Lymphocytes Relative: 22.6 % (ref 12.0–46.0)
Lymphs Abs: 2.9 10*3/uL (ref 0.7–4.0)
MCHC: 33.7 g/dL (ref 30.0–36.0)
MCV: 87.6 fl (ref 78.0–100.0)
Monocytes Absolute: 0.8 10*3/uL (ref 0.1–1.0)
Monocytes Relative: 6.7 % (ref 3.0–12.0)
Neutro Abs: 7.9 10*3/uL — ABNORMAL HIGH (ref 1.4–7.7)
Neutrophils Relative %: 62.8 % (ref 43.0–77.0)
Platelets: 576 10*3/uL — ABNORMAL HIGH (ref 150.0–400.0)
RBC: 4.86 Mil/uL (ref 4.22–5.81)
RDW: 13.9 % (ref 11.5–15.5)
WBC: 12.6 10*3/uL — ABNORMAL HIGH (ref 4.0–10.5)

## 2022-07-18 ENCOUNTER — Encounter: Payer: Self-pay | Admitting: Internal Medicine

## 2022-07-18 DIAGNOSIS — D72829 Elevated white blood cell count, unspecified: Secondary | ICD-10-CM

## 2022-07-18 DIAGNOSIS — D75839 Thrombocytosis, unspecified: Secondary | ICD-10-CM

## 2022-07-24 ENCOUNTER — Telehealth: Payer: Self-pay | Admitting: Hematology and Oncology

## 2022-07-24 NOTE — Telephone Encounter (Signed)
Scheduled appt per 9/10 referral. Pt is aware of appt date and time. Pt is aware to arrive 15 mins prior to appt time and to bring and updated insurance card. Pt is aware of appt location.

## 2022-07-30 ENCOUNTER — Ambulatory Visit (INDEPENDENT_AMBULATORY_CARE_PROVIDER_SITE_OTHER): Payer: Medicare Other

## 2022-07-30 DIAGNOSIS — Z23 Encounter for immunization: Secondary | ICD-10-CM | POA: Diagnosis not present

## 2022-07-30 NOTE — Progress Notes (Signed)
Pt received flu vaccine w/o any complications.  

## 2022-08-12 DIAGNOSIS — D72829 Elevated white blood cell count, unspecified: Secondary | ICD-10-CM | POA: Insufficient documentation

## 2022-08-12 NOTE — Assessment & Plan Note (Signed)
The pattern of leukocytosis is predominantly neutrophilia and mildly elevated eosinophil count I will order additional work-up to exclude myeloproliferative disorder

## 2022-08-12 NOTE — Progress Notes (Signed)
[facility] CONSULT NOTE   ASSESSMENT & PLAN  Leukocytosis The pattern of leukocytosis is predominantly neutrophilia and mildly elevated eosinophil count I will order additional work-up to exclude myeloproliferative disorder  Thrombocytosis Dependent thrombocytosis and leukocytosis could indicate myeloproliferative disorder I will order additional work-up I recommend aspirin therapy  Dehydration He is prone to get dehydrated I recommend increase oral fluid intake as tolerated   Orders Placed This Encounter  Procedures   CBC with Differential (Cancer Center Only)    Standing Status:   Future    Number of Occurrences:   1    Standing Expiration Date:   08/14/2023   Uric acid    Standing Status:   Future    Number of Occurrences:   1    Standing Expiration Date:   08/14/2023   Sedimentation rate    Standing Status:   Future    Number of Occurrences:   1    Standing Expiration Date:   08/14/2023   Lactate dehydrogenase    Standing Status:   Future    Number of Occurrences:   1    Standing Expiration Date:   08/14/2023   JAK2 (including V617F and Exon 12), MPL, and CALR-Next Generation Sequencing    Standing Status:   Future    Number of Occurrences:   1    Standing Expiration Date:   08/13/2023     All questions were answered. The patient knows to call the clinic with any problems, questions or concerns. I spent 60 minutes counseling the patient face to face and on counseling.     Heath Lark, MD 08/12/2022 1:53 PM  Patient Care Team: Binnie Rail, MD as PCP - General (Internal Medicine) O'Neal, Cassie Freer, MD as PCP - Cardiology (Cardiology) Teena Irani, MD (Inactive) as Consulting Physician (Gastroenterology) Charlton Haws, [facility] as Pharmacist (Pharmacist) Warden Fillers, MD as Consulting Physician (Ophthalmology)  CHIEF COMPLAINTS/PURPOSE OF CONSULTATION:  Leukocytosis and thrombocytosis  HISTORY OF PRESENTING ILLNESS:  Mark Copeland 88 y.o. male is here because of elevated WBC and platelet count  He was found to have abnormal CBC from recent blood draw.  I have the opportunity to review his CBC dated back to several years On 05/23/2020, his CBC was normal with white count of 8.7, hemoglobin 16.7 and platelet count of 351 On 06/05/2021, his CBC is not normal with white count of 9.2, hemoglobin of 15 and platelet count of 4 54 On June 07, 2022, white blood cell count is 11.5, hemoglobin 14.4 and platelet count 607 On 06/18/2022, white blood cell count is elevated at 12.7, hemoglobin 14.3 and platelet count of 592 On 07/17/2022, white count 12.6, hemoglobin 14.3 and platelet count 576  He denies recent infection. The last prescription antibiotics was more than 3 months ago.  He has a rash near the scrotum that is improving with topical cream He has chronic intermittent sinus congestion that comes and goes There is not reported symptoms of cough, urinary frequency/urgency or dysuria, diarrhea or joint swelling/pain  He had no prior history or diagnosis of cancer. His age appropriate screening programs are up-to-date. The patient has no prior diagnosis of autoimmune disease  No new pets or recent foreign travel Denies abnormal night sweats  MEDICAL HISTORY:  Past Medical History:  Diagnosis Date   BPH (benign prostatic hypertrophy)    Colonic polyp 2011   Dr.John Hayes   Fasting hyperglycemia    Hiatal hernia 2012   Hyperlipidemia  Interstitial cystitis    presentation in 1997 w/ abdominal pain/distention, Dr. Lawrence Santiago, [facility]   Nephrolithiasis    x 2   Skin cancer    basal cell, Dr.Dan Ronnald Ramp    SURGICAL HISTORY: Past Surgical History:  Procedure Laterality Date   COLONOSCOPY W/ POLYPECTOMY     and esophageal dilation 01/2008 by Dr.Hayes; bladder stones s/p urethral dilation 10/2007 by Dr. Amalia Hailey   CYSTOSCOPY     2004/2011; Hospitalized 1997 w/ I.C.   LAPAROSCOPIC CHOLECYSTECTOMY  07/14/11   SEPTOPLASTY      TONSILLECTOMY     UPPER GASTROINTESTINAL ENDOSCOPY  2012   Dr Teena Irani; hiatal hernia    SOCIAL HISTORY: Social History   Socioeconomic History   Marital status: Married    Spouse name: Not on file   Number of children: 1   Years of education: Not on file   Highest education level: Not on file  Occupational History   Not on file  Tobacco Use   Smoking status: Former    Types: Cigarettes    Quit date: 08/04/1960    Years since quitting: 62.0   Smokeless tobacco: Never   Tobacco comments:    smoked 1958-1961, up to 2 cigars / day  Substance and Sexual Activity   Alcohol use: Yes    Comment:  occasional wine   Drug use: No   Sexual activity: Not Currently  Other Topics Concern   Not on file  Social History Narrative   Retired Electrical engineer      Social Determinants of Health   Financial Resource Strain: [place]  (04/09/2022)   Overall Financial Resource Strain (CARDIA)    Difficulty of Paying Living Expenses: Not hard at all  Food Insecurity: No Food Insecurity (04/09/2022)   Hunger Vital Sign    Worried About Running Out of Food in the Last Year: Never true    [place] in the Last Year: Never true  Transportation Needs: No Transportation Needs (04/09/2022)   PRAPARE - Hydrologist (Medical): No    Lack of Transportation (Non-Medical): No  Physical Activity: Sufficiently Active (04/09/2022)   Exercise Vital Sign    Days of Exercise per Week: 7 days    Minutes of Exercise per Session: 60 min  Stress: No Stress Concern Present (04/09/2022)   [place]    Feeling of Stress : Not at all  Social Connections: [place] (04/09/2022)   Social Connection and Isolation Panel [NHANES]    Frequency of Communication with Friends and Family: More than three times a week    Frequency of Social Gatherings with Friends and Family: More than three times a week     Attends Religious Services: More than 4 times per year    Active Member of [employer] or Organizations: Yes    Attends Music therapist: More than 4 times per year    Marital Status: Married  Human resources officer Violence: Not At Risk (04/09/2022)   Humiliation, Afraid, Rape, and Kick questionnaire    Fear of Current or Ex-Partner: No    Emotionally Abused: No    Physically Abused: No    Sexually Abused: No    FAMILY HISTORY: Family History  Problem Relation Age of Onset   Transient ischemic attack Father 21   Hypertension Father    Coronary artery disease Father    Stroke Mother 6   Hypertension Brother  Diabetes Neg Hx    Cancer Neg Hx     ALLERGIES:  is allergic to nitrofurantoin, penicillins, sulfonamide derivatives, amlodipine, shellfish allergy, and tamsulosin.  MEDICATIONS:  Current Outpatient Medications  Medication Sig Dispense Refill   aspirin EC 81 MG tablet Take 81 mg by mouth daily. Swallow whole.     amLODipine (NORVASC) 5 MG tablet Take 1 tablet (5 mg total) by mouth daily. 90 tablet 3   famotidine (PEPCID) 20 MG tablet TAKE 1 TABLET BY MOUTH AT  NIGHT 90 tablet 3   fexofenadine (ALLEGRA) 180 MG tablet Take 180 mg by mouth daily as needed (allergies).     fluticasone (FLONASE) 50 MCG/ACT nasal spray Place 2 sprays into the nose 2 (two) times daily as needed for rhinitis or allergies.     Hydrocortisone-Iodoquinol 1-1 % CREA Apply 1 application topically daily.     Multiple Vitamins-Minerals (PRESERVISION AREDS PO) Take 1 tablet by mouth daily.     sertraline (ZOLOFT) 25 MG tablet TAKE 1 TABLET (25 MG TOTAL) BY MOUTH DAILY. 90 tablet 4   telmisartan-hydrochlorothiazide (MICARDIS HCT) 40-12.5 MG tablet Take 1 tablet by mouth daily. 90 tablet 3   No current facility-administered medications for this visit.    REVIEW OF SYSTEMS:   Constitutional: Denies fevers, chills or abnormal night sweats Eyes: Denies blurriness of vision, double vision or watery  eyes Ears, nose, mouth, throat, and face: Denies mucositis or sore throat Respiratory: Denies cough, dyspnea or wheezes Cardiovascular: Denies palpitation, chest discomfort or lower extremity swelling Gastrointestinal:  Denies nausea, heartburn or change in bowel habits Skin: Denies abnormal skin rashes Lymphatics: Denies new lymphadenopathy or easy bruising Neurological:Denies numbness, tingling or new weaknesses Behavioral/Psych: Mood is stable, no new changes  All other systems were reviewed with the patient and are negative.  PHYSICAL EXAMINATION: ECOG PERFORMANCE STATUS: 0 - Asymptomatic  Vitals:   08/13/22 1412  BP: (!) 159/83  Pulse: 66  Resp: 18  Temp: 98.4 F (36.9 C)  SpO2: 100%   Filed Weights   08/13/22 1412  Weight: 167 lb 3.2 oz (75.8 kg)    GENERAL:alert, no distress and comfortable SKIN: skin color, texture, turgor are normal, no rashes or significant lesions EYES: normal, conjunctiva are pink and non-injected, sclera clear OROPHARYNX:no exudate, no erythema and lips, buccal mucosa, and tongue normal  NECK: supple, thyroid normal size, non-tender, without nodularity LYMPH:  no palpable lymphadenopathy in the cervical, axillary or inguinal LUNGS: clear to auscultation and percussion with normal breathing effort HEART: regular rate & rhythm and no murmurs and no lower extremity edema ABDOMEN:abdomen soft, non-tender and normal bowel sounds Musculoskeletal:no cyanosis of digits and no clubbing  PSYCH: alert & oriented x 3 with fluent speech NEURO: no focal motor/sensory deficits  LABORATORY DATA:  I have reviewed the data as listed Recent Results (from the past 2160 hour(s))  Lipid panel     Status: None   Collection Time: 06/07/22  8:47 AM  Result Value Ref Range   Cholesterol 156 0 - 200 mg/dL    Comment: ATP III Classification       Desirable:  < 200 mg/dL               Borderline High:  200 - 239 mg/dL          High:  > = 240 mg/dL   Triglycerides  99.0 0.0 - 149.0 mg/dL    Comment: Normal:  <150 mg/dLBorderline High:  150 - 199 mg/dL   HDL 45.60 >  39.00 mg/dL   VLDL 19.8 0.0 - 40.0 mg/dL   LDL Cholesterol 91 0 - 99 mg/dL   Total CHOL/HDL Ratio 3     Comment:                Men          Women1/2 Average Risk     3.4          3.3Average Risk          5.0          4.42X Average Risk          9.6          7.13X Average Risk          15.0          11.0                       NonHDL 110.54     Comment: NOTE:  Non-HDL goal should be 30 mg/dL higher than patient's LDL goal (i.e. LDL goal of < 70 mg/dL, would have non-HDL goal of < 100 mg/dL)  Hemoglobin A1c     Status: None   Collection Time: 06/07/22  8:47 AM  Result Value Ref Range   Hgb A1c MFr Bld 6.1 4.6 - 6.5 %    Comment: Glycemic Control Guidelines for People with Diabetes:Non Diabetic:  <6%Goal of Therapy: <7%Additional Action Suggested:  >8%   Comprehensive metabolic panel     Status: Abnormal   Collection Time: 06/07/22  8:47 AM  Result Value Ref Range   Sodium 139 135 - 145 mEq/L   Potassium 4.0 3.5 - 5.1 mEq/L   Chloride 103 96 - 112 mEq/L   CO2 27 19 - 32 mEq/L   Glucose, Bld 102 (H) 70 - 99 mg/dL   BUN 22 6 - 23 mg/dL   Creatinine, Ser 1.07 0.40 - 1.50 mg/dL   Total Bilirubin 0.8 0.2 - 1.2 mg/dL   Alkaline Phosphatase 57 39 - 117 U/L   AST 19 0 - 37 U/L   ALT 14 0 - 53 U/L   Total Protein 6.9 6.0 - 8.3 g/dL   Albumin 4.7 3.5 - 5.2 g/dL   GFR 63.02 >60.00 mL/min    Comment: Calculated using the CKD-EPI Creatinine Equation (2021)   Calcium 9.8 8.4 - 10.5 mg/dL  CBC with Differential/Platelet     Status: Abnormal   Collection Time: 06/07/22  8:47 AM  Result Value Ref Range   WBC 11.5 (H) 4.0 - 10.5 K/uL   RBC 4.98 4.22 - 5.81 Mil/uL   Hemoglobin 14.4 13.0 - 17.0 g/dL   HCT 44.3 39.0 - 52.0 %   MCV 88.9 78.0 - 100.0 fl   MCHC 32.6 30.0 - 36.0 g/dL   RDW 14.1 11.5 - 15.5 %   Platelets 607.0 Repeated and verified X2. (H) 150.0 - 400.0 K/uL   Neutrophils Relative %  62.2 43.0 - 77.0 %   Lymphocytes Relative 25.4 12.0 - 46.0 %   Monocytes Relative 5.6 3.0 - 12.0 %   Eosinophils Relative 5.1 (H) 0.0 - 5.0 %   Basophils Relative 1.7 0.0 - 3.0 %   Neutro Abs 7.2 1.4 - 7.7 K/uL   Lymphs Abs 2.9 0.7 - 4.0 K/uL   Monocytes Absolute 0.6 0.1 - 1.0 K/uL   Eosinophils Absolute 0.6 0.0 - 0.7 K/uL   Basophils Absolute 0.2 (H) 0.0 - 0.1 K/uL  CBC with Differential/Platelet  Status: Abnormal   Collection Time: 06/18/22 10:06 AM  Result Value Ref Range   WBC 12.7 (H) 4.0 - 10.5 K/uL   RBC 4.95 4.22 - 5.81 Mil/uL   Hemoglobin 14.3 13.0 - 17.0 g/dL   HCT 43.9 39.0 - 52.0 %   MCV 88.7 78.0 - 100.0 fl   MCHC 32.6 30.0 - 36.0 g/dL   RDW 14.2 11.5 - 15.5 %   Platelets 592.0 (H) 150.0 - 400.0 K/uL   Neutrophils Relative % 60.6 43.0 - 77.0 %   Lymphocytes Relative 26.1 12.0 - 46.0 %   Monocytes Relative 5.6 3.0 - 12.0 %   Eosinophils Relative 6.4 (H) 0.0 - 5.0 %   Basophils Relative 1.3 0.0 - 3.0 %   Neutro Abs 7.7 1.4 - 7.7 K/uL   Lymphs Abs 3.3 0.7 - 4.0 K/uL   Monocytes Absolute 0.7 0.1 - 1.0 K/uL   Eosinophils Absolute 0.8 (H) 0.0 - 0.7 K/uL   Basophils Absolute 0.2 (H) 0.0 - 0.1 K/uL  CBC with Differential/Platelet     Status: Abnormal   Collection Time: 07/17/22 10:27 AM  Result Value Ref Range   WBC 12.6 (H) 4.0 - 10.5 K/uL   RBC 4.86 4.22 - 5.81 Mil/uL   Hemoglobin 14.3 13.0 - 17.0 g/dL   HCT 42.6 39.0 - 52.0 %   MCV 87.6 78.0 - 100.0 fl   MCHC 33.7 30.0 - 36.0 g/dL   RDW 13.9 11.5 - 15.5 %   Platelets 576.0 (H) 150.0 - 400.0 K/uL   Neutrophils Relative % 62.8 43.0 - 77.0 %   Lymphocytes Relative 22.6 12.0 - 46.0 %   Monocytes Relative 6.7 3.0 - 12.0 %   Eosinophils Relative 6.5 (H) 0.0 - 5.0 %   Basophils Relative 1.4 0.0 - 3.0 %   Neutro Abs 7.9 (H) 1.4 - 7.7 K/uL   Lymphs Abs 2.9 0.7 - 4.0 K/uL   Monocytes Absolute 0.8 0.1 - 1.0 K/uL   Eosinophils Absolute 0.8 (H) 0.0 - 0.7 K/uL   Basophils Absolute 0.2 (H) 0.0 - 0.1 K/uL  Lactate  dehydrogenase     Status: Abnormal   Collection Time: 08/13/22  2:40 PM  Result Value Ref Range   LDH 221 (H) 98 - 192 U/L    Comment: Performed at [facility], [address], [place], [place] [address]  Uric acid     Status: None   Collection Time: 08/13/22  2:41 PM  Result Value Ref Range   Uric Acid, Serum 6.9 3.7 - 8.6 mg/dL    Comment: Performed at [facility], [place] [address] [place],  [address]  CBC with Differential ([place] Only)     Status: Abnormal   Collection Time: 08/13/22  2:41 PM  Result Value Ref Range   WBC Count 16.6 (H) 4.0 - 10.5 K/uL   RBC 4.87 4.22 - 5.81 MIL/uL   Hemoglobin 14.6 13.0 - 17.0 g/dL   HCT 43.3 39.0 - 52.0 %   MCV 88.9 80.0 - 100.0 fL   MCH 30.0 26.0 - 34.0 pg   MCHC 33.7 30.0 - 36.0 g/dL   RDW 13.8 11.5 - 15.5 %   Platelet Count 599 (H) 150 - 400 K/uL   nRBC 0.0 0.0 - 0.2 %   Neutrophils Relative % 66 %   Neutro Abs 11.0 (H) 1.7 - 7.7 K/uL   Lymphocytes Relative 21 %   Lymphs Abs 3.5 0.7 - 4.0 K/uL  Monocytes Relative 6 %   Monocytes Absolute 1.1 (H) 0.1 - 1.0 K/uL   Eosinophils Relative 5 %   Eosinophils Absolute 0.8 (H) 0.0 - 0.5 K/uL   Basophils Relative 1 %   Basophils Absolute 0.2 (H) 0.0 - 0.1 K/uL   Immature Granulocytes 1 %   Abs Immature Granulocytes 0.10 (H) 0.00 - 0.07 K/uL    Comment: Performed at [facility], [place] [address] [place], [place] [address]

## 2022-08-12 NOTE — Assessment & Plan Note (Signed)
Dependent thrombocytosis and leukocytosis could indicate myeloproliferative disorder I will order additional work-up I recommend aspirin therapy

## 2022-08-13 ENCOUNTER — Encounter: Payer: Self-pay | Admitting: Hematology and Oncology

## 2022-08-13 ENCOUNTER — Inpatient Hospital Stay: Payer: Medicare Other | Attending: Hematology and Oncology | Admitting: Hematology and Oncology

## 2022-08-13 ENCOUNTER — Inpatient Hospital Stay: Payer: Medicare Other

## 2022-08-13 VITALS — BP 159/83 | HR 66 | Temp 98.4°F | Resp 18 | Ht 70.0 in | Wt 167.2 lb

## 2022-08-13 DIAGNOSIS — D72825 Bandemia: Secondary | ICD-10-CM

## 2022-08-13 DIAGNOSIS — Z7982 Long term (current) use of aspirin: Secondary | ICD-10-CM | POA: Insufficient documentation

## 2022-08-13 DIAGNOSIS — E860 Dehydration: Secondary | ICD-10-CM | POA: Insufficient documentation

## 2022-08-13 DIAGNOSIS — D473 Essential (hemorrhagic) thrombocythemia: Secondary | ICD-10-CM | POA: Diagnosis not present

## 2022-08-13 DIAGNOSIS — D471 Chronic myeloproliferative disease: Secondary | ICD-10-CM | POA: Diagnosis not present

## 2022-08-13 DIAGNOSIS — Z87891 Personal history of nicotine dependence: Secondary | ICD-10-CM | POA: Diagnosis not present

## 2022-08-13 DIAGNOSIS — D75839 Thrombocytosis, unspecified: Secondary | ICD-10-CM | POA: Diagnosis not present

## 2022-08-13 DIAGNOSIS — D72829 Elevated white blood cell count, unspecified: Secondary | ICD-10-CM | POA: Diagnosis not present

## 2022-08-13 LAB — CBC WITH DIFFERENTIAL (CANCER CENTER ONLY)
Abs Immature Granulocytes: 0.1 10*3/uL — ABNORMAL HIGH (ref 0.00–0.07)
Basophils Absolute: 0.2 10*3/uL — ABNORMAL HIGH (ref 0.0–0.1)
Basophils Relative: 1 %
Eosinophils Absolute: 0.8 10*3/uL — ABNORMAL HIGH (ref 0.0–0.5)
Eosinophils Relative: 5 %
HCT: 43.3 % (ref 39.0–52.0)
Hemoglobin: 14.6 g/dL (ref 13.0–17.0)
Immature Granulocytes: 1 %
Lymphocytes Relative: 21 %
Lymphs Abs: 3.5 10*3/uL (ref 0.7–4.0)
MCH: 30 pg (ref 26.0–34.0)
MCHC: 33.7 g/dL (ref 30.0–36.0)
MCV: 88.9 fL (ref 80.0–100.0)
Monocytes Absolute: 1.1 10*3/uL — ABNORMAL HIGH (ref 0.1–1.0)
Monocytes Relative: 6 %
Neutro Abs: 11 10*3/uL — ABNORMAL HIGH (ref 1.7–7.7)
Neutrophils Relative %: 66 %
Platelet Count: 599 10*3/uL — ABNORMAL HIGH (ref 150–400)
RBC: 4.87 MIL/uL (ref 4.22–5.81)
RDW: 13.8 % (ref 11.5–15.5)
WBC Count: 16.6 10*3/uL — ABNORMAL HIGH (ref 4.0–10.5)
nRBC: 0 % (ref 0.0–0.2)

## 2022-08-13 LAB — SEDIMENTATION RATE: Sed Rate: 0 mm/hr (ref 0–16)

## 2022-08-13 LAB — URIC ACID: Uric Acid, Serum: 6.9 mg/dL (ref 3.7–8.6)

## 2022-08-13 LAB — LACTATE DEHYDROGENASE: LDH: 221 U/L — ABNORMAL HIGH (ref 98–192)

## 2022-08-13 NOTE — Assessment & Plan Note (Signed)
He is prone to get dehydrated I recommend increase oral fluid intake as tolerated

## 2022-08-15 ENCOUNTER — Other Ambulatory Visit: Payer: Self-pay | Admitting: Hematology and Oncology

## 2022-08-15 ENCOUNTER — Other Ambulatory Visit: Payer: Self-pay

## 2022-08-15 DIAGNOSIS — D75839 Thrombocytosis, unspecified: Secondary | ICD-10-CM

## 2022-08-15 DIAGNOSIS — D72825 Bandemia: Secondary | ICD-10-CM

## 2022-08-20 ENCOUNTER — Telehealth: Payer: Self-pay

## 2022-08-20 ENCOUNTER — Encounter: Payer: Self-pay | Admitting: Hematology and Oncology

## 2022-08-20 DIAGNOSIS — D471 Chronic myeloproliferative disease: Secondary | ICD-10-CM | POA: Insufficient documentation

## 2022-08-20 LAB — JAK2 (INCLUDING V617F AND EXON 12), MPL,& CALR-NEXT GEN SEQ

## 2022-08-20 LAB — BCR ABL1 FISH (GENPATH)

## 2022-08-20 NOTE — Telephone Encounter (Signed)
Called and scheduled appt on 10/12 at 1020, arrive 20  mins early. He is aware of appts.

## 2022-08-22 ENCOUNTER — Encounter: Payer: Self-pay | Admitting: Hematology and Oncology

## 2022-08-22 ENCOUNTER — Inpatient Hospital Stay (HOSPITAL_BASED_OUTPATIENT_CLINIC_OR_DEPARTMENT_OTHER): Payer: Medicare Other | Admitting: Hematology and Oncology

## 2022-08-22 ENCOUNTER — Telehealth: Payer: Self-pay

## 2022-08-22 VITALS — BP 149/65 | HR 69 | Temp 97.7°F | Resp 18 | Ht 70.0 in | Wt 170.2 lb

## 2022-08-22 DIAGNOSIS — D471 Chronic myeloproliferative disease: Secondary | ICD-10-CM | POA: Diagnosis not present

## 2022-08-22 DIAGNOSIS — D473 Essential (hemorrhagic) thrombocythemia: Secondary | ICD-10-CM | POA: Diagnosis not present

## 2022-08-22 DIAGNOSIS — Z7982 Long term (current) use of aspirin: Secondary | ICD-10-CM | POA: Diagnosis not present

## 2022-08-22 DIAGNOSIS — D75839 Thrombocytosis, unspecified: Secondary | ICD-10-CM | POA: Diagnosis not present

## 2022-08-22 DIAGNOSIS — D72829 Elevated white blood cell count, unspecified: Secondary | ICD-10-CM | POA: Diagnosis not present

## 2022-08-22 DIAGNOSIS — E860 Dehydration: Secondary | ICD-10-CM | POA: Diagnosis not present

## 2022-08-22 DIAGNOSIS — Z87891 Personal history of nicotine dependence: Secondary | ICD-10-CM | POA: Diagnosis not present

## 2022-08-22 MED ORDER — HYDROXYUREA 500 MG PO CAPS: 500.0000 mg | ORAL_CAPSULE | Freq: Every day | ORAL | 1 refills | Status: DC

## 2022-08-22 NOTE — Assessment & Plan Note (Signed)
The patient is able to increase oral fluid intake He will continue the same

## 2022-08-22 NOTE — Assessment & Plan Note (Signed)
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, and this is the most likely cause of his myelo proliferative disorder I suspect he might have essential thrombocytosis and possibly myelofibrosis He is not symptomatic He tolerated aspirin therapy well We discussed the risk of thrombosis associated with this disease We discussed the risk and benefits of hydroxyurea and he is in agreement to proceed He will start hydroxyurea at 500 mg daily along with aspirin He will return next week for toxicity review In the first month, I plan to monitor his CBC closely weekly Once we are able to get him at the right dose, we will space out future appointment We discussed circumstances that could cause risk of pancytopenia with treatment such as infection, stress or surgery

## 2022-08-22 NOTE — Progress Notes (Signed)
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, and this is the most likely cause of his myelo proliferative disorder I suspect he might have essential thrombocytosis and possibly myelofibrosis He is not symptomatic He tolerated aspirin therapy well We discussed the risk of thrombosis associated with this disease We discussed the risk and benefits of hydroxyurea and he is in agreement to proceed He will start hydroxyurea at 500 mg daily along with aspirin He will return next week for toxicity review In the first month, I plan to monitor his CBC closely weekly Once we are able to get him at the right dose, we will space out future appointment We discussed circumstances that could cause risk of pancytopenia with treatment such as infection, stress or surgery  Dehydration The patient is able to increase oral fluid intake He will continue the same  Orders Placed This Encounter  Procedures   CBC with Differential/Platelet    Standing Status:   Standing    Number of Occurrences:   22    Standing Expiration Date:   08/23/2023    All questions were answered. The patient knows to call the clinic with any problems, questions or concerns. The total time spent in the appointment was 40 minutes encounter with patients including review of chart and various tests results, discussions about plan of care and coordination of care plan   Mark Lark, MD 08/22/2022 11:00 AM  INTERVAL HISTORY: Please see below for  problem oriented charting. he returns for review of test result with his wife We spent majority of our discussion reviewing test results, discussed pathogenesis, prognosis and implication of diagnosis and treatment to his lifestyle Since last time I saw him, he tolerated aspirin well He is able to increase oral fluid intake We discussed the risk and benefits of treatment  REVIEW OF SYSTEMS:   Constitutional: Denies fevers, chills or abnormal weight loss Eyes: Denies blurriness of vision Ears, nose, mouth, throat, and face: Denies mucositis or sore throat Respiratory: Denies cough, dyspnea or wheezes Cardiovascular: Denies palpitation, chest discomfort or lower extremity swelling Gastrointestinal:  Denies nausea, heartburn or change in bowel habits Skin: Denies abnormal skin rashes Lymphatics: Denies new lymphadenopathy or easy bruising Neurological:Denies numbness, tingling or new weaknesses Behavioral/Psych: Mood is stable, no new changes  All other systems were reviewed with the patient and are negative.  I have reviewed the past medical history, past surgical history, social history and family history with the patient and they are unchanged from previous note.  ALLERGIES:  is allergic to nitrofurantoin, penicillins, sulfonamide derivatives, amlodipine, shellfish allergy, and tamsulosin.  MEDICATIONS:  Current Outpatient Medications  Medication Sig Dispense Refill   hydroxyurea (HYDREA) 500 MG capsule Take 1 capsule (500 mg total) by mouth daily. May take with food to minimize GI side effects. 30 capsule 1   amLODipine (NORVASC) 5 MG tablet Take 1 tablet (5 mg total) by mouth daily. 90 tablet 3   aspirin EC 81 MG tablet Take 81 mg by mouth daily. Swallow whole.     famotidine (PEPCID) 20 MG tablet TAKE 1 TABLET BY MOUTH AT  NIGHT 90 tablet 3   fexofenadine (ALLEGRA) 180 MG tablet Take 180 mg by mouth daily as needed (allergies).     fluticasone (FLONASE) 50 MCG/ACT nasal spray Place  2 sprays into the nose 2 (two) times daily as needed for rhinitis or allergies.     Hydrocortisone-Iodoquinol 1-1 % CREA Apply 1 application topically daily.     Multiple Vitamins-Minerals (PRESERVISION AREDS PO) Take 1 tablet by mouth daily.     sertraline (ZOLOFT) 25 MG tablet TAKE 1 TABLET (25 MG TOTAL) BY MOUTH DAILY. 90 tablet 4   telmisartan-hydrochlorothiazide (MICARDIS HCT) 40-12.5 MG tablet Take 1 tablet by mouth daily. 90 tablet 3   No current facility-administered medications for this visit.    SUMMARY OF ONCOLOGIC HISTORY: Mark Copeland 88 y.o. male is here because of elevated WBC and platelet count  He was found to have abnormal CBC from recent blood draw.  I have the opportunity to review his CBC dated back to several years On 05/23/2020, his CBC was normal with white count of 8.7, hemoglobin 16.7 and platelet count of 351 On 06/05/2021, his CBC is not normal with white count of 9.2, hemoglobin of 15 and platelet count of 4 54 On June 07, 2022, white blood cell count is 11.5, hemoglobin 14.4 and platelet count 607 On 06/18/2022, white blood cell count is elevated at 12.7, hemoglobin 14.3 and platelet count of 592 On 07/17/2022, white count 12.6, hemoglobin 14.3 and platelet count 576 On August 13, 2022, molecular testing came back positive for  CAL-R On August 22, 2022, the patient is started on hydroxyurea  PHYSICAL EXAMINATION: ECOG PERFORMANCE STATUS: 0 - Asymptomatic  Vitals:   08/22/22 0955  BP: (!) 149/65  Pulse: 69  Resp: 18  Temp: 97.7 F (36.5 C)  SpO2: 99%   Filed Weights   08/22/22 0955  Weight: 170 lb 3.2 oz (77.2 kg)    GENERAL:alert, no distress and comfortable NEURO: alert & oriented x 3 with fluent speech, no focal motor/sensory deficits  LABORATORY DATA:  I have reviewed the data as listed    Component Value Date/Time   NA 139 06/07/2022 0847   K 4.0 06/07/2022 0847   CL 103 06/07/2022 0847   CO2 27 06/07/2022 0847   GLUCOSE 102 (H)  06/07/2022 0847   BUN 22 06/07/2022 0847   CREATININE 1.07 06/07/2022 0847   CREATININE 1.00 05/23/2020 0819   CALCIUM 9.8 06/07/2022 0847   PROT 6.9 06/07/2022 0847   ALBUMIN 4.7 06/07/2022 0847   AST 19 06/07/2022 0847   ALT 14 06/07/2022 0847   ALKPHOS 57 06/07/2022 0847   BILITOT 0.8 06/07/2022 0847   GFRNONAA >60 06/28/2018 0345   GFRAA >60 06/28/2018 0345    No results found for: "SPEP", "UPEP"  Lab Results  Component Value Date   WBC 16.6 (H) 08/13/2022   NEUTROABS 11.0 (H) 08/13/2022   HGB 14.6 08/13/2022   HCT 43.3 08/13/2022   MCV 88.9 08/13/2022   PLT 599 (H) 08/13/2022      Chemistry      Component Value Date/Time   NA 139 06/07/2022 0847   K 4.0 06/07/2022 0847   CL 103 06/07/2022 0847   CO2 27 06/07/2022 0847   BUN 22 06/07/2022 0847   CREATININE 1.07 06/07/2022 0847   CREATININE 1.00 05/23/2020 0819      Component Value Date/Time   CALCIUM 9.8 06/07/2022 0847   ALKPHOS 57 06/07/2022 0847   AST 19 06/07/2022 0847  ALT 14 06/07/2022 0847   BILITOT 0.8 06/07/2022 0847

## 2022-08-22 NOTE — Telephone Encounter (Signed)
Spoke with patient's wife to confirm changes to upcoming appointments with Dr. Alvy Bimler. Patient will be see at the [facility] on 10/20. Spouse appreciative of call and confirmed appointment date and time with RN.

## 2022-08-26 ENCOUNTER — Ambulatory Visit: Payer: Medicare Other | Admitting: Hematology and Oncology

## 2022-08-28 DIAGNOSIS — D225 Melanocytic nevi of trunk: Secondary | ICD-10-CM | POA: Diagnosis not present

## 2022-08-28 DIAGNOSIS — C44519 Basal cell carcinoma of skin of other part of trunk: Secondary | ICD-10-CM | POA: Diagnosis not present

## 2022-08-28 DIAGNOSIS — L821 Other seborrheic keratosis: Secondary | ICD-10-CM | POA: Diagnosis not present

## 2022-08-28 DIAGNOSIS — Z85828 Personal history of other malignant neoplasm of skin: Secondary | ICD-10-CM | POA: Diagnosis not present

## 2022-08-30 ENCOUNTER — Other Ambulatory Visit: Payer: Self-pay

## 2022-08-30 ENCOUNTER — Encounter: Payer: Self-pay | Admitting: Hematology and Oncology

## 2022-08-30 ENCOUNTER — Inpatient Hospital Stay: Payer: Medicare Other

## 2022-08-30 ENCOUNTER — Inpatient Hospital Stay: Payer: Medicare Other | Admitting: Hematology and Oncology

## 2022-08-30 VITALS — BP 143/62 | HR 65 | Temp 97.6°F | Resp 18 | Ht 70.0 in | Wt 169.8 lb

## 2022-08-30 DIAGNOSIS — D471 Chronic myeloproliferative disease: Secondary | ICD-10-CM | POA: Diagnosis not present

## 2022-08-30 DIAGNOSIS — Z7982 Long term (current) use of aspirin: Secondary | ICD-10-CM | POA: Diagnosis not present

## 2022-08-30 DIAGNOSIS — D473 Essential (hemorrhagic) thrombocythemia: Secondary | ICD-10-CM | POA: Diagnosis not present

## 2022-08-30 DIAGNOSIS — E860 Dehydration: Secondary | ICD-10-CM | POA: Diagnosis not present

## 2022-08-30 DIAGNOSIS — Z87891 Personal history of nicotine dependence: Secondary | ICD-10-CM | POA: Diagnosis not present

## 2022-08-30 DIAGNOSIS — D75839 Thrombocytosis, unspecified: Secondary | ICD-10-CM | POA: Diagnosis not present

## 2022-08-30 DIAGNOSIS — D72829 Elevated white blood cell count, unspecified: Secondary | ICD-10-CM | POA: Diagnosis not present

## 2022-08-30 LAB — CBC WITH DIFFERENTIAL/PLATELET
Abs Immature Granulocytes: 0.08 10*3/uL — ABNORMAL HIGH (ref 0.00–0.07)
Basophils Absolute: 0.1 10*3/uL (ref 0.0–0.1)
Basophils Relative: 1 %
Eosinophils Absolute: 1 10*3/uL — ABNORMAL HIGH (ref 0.0–0.5)
Eosinophils Relative: 8 %
HCT: 42.4 % (ref 39.0–52.0)
Hemoglobin: 14.1 g/dL (ref 13.0–17.0)
Immature Granulocytes: 1 %
Lymphocytes Relative: 28 %
Lymphs Abs: 3.6 10*3/uL (ref 0.7–4.0)
MCH: 29.7 pg (ref 26.0–34.0)
MCHC: 33.3 g/dL (ref 30.0–36.0)
MCV: 89.5 fL (ref 80.0–100.0)
Monocytes Absolute: 0.8 10*3/uL (ref 0.1–1.0)
Monocytes Relative: 6 %
Neutro Abs: 7.4 10*3/uL (ref 1.7–7.7)
Neutrophils Relative %: 56 %
Platelets: 645 10*3/uL — ABNORMAL HIGH (ref 150–400)
RBC: 4.74 MIL/uL (ref 4.22–5.81)
RDW: 14 % (ref 11.5–15.5)
WBC: 13 10*3/uL — ABNORMAL HIGH (ref 4.0–10.5)
nRBC: 0 % (ref 0.0–0.2)

## 2022-08-30 MED ORDER — HYDROXYUREA 500 MG PO CAPS: 1000.0000 mg | ORAL_CAPSULE | Freq: Every day | ORAL | 1 refills | Status: DC

## 2022-08-30 NOTE — Assessment & Plan Note (Signed)
He tolerated hydroxyurea well Even though leukocytosis has improved, it has not improved his platelet count I recommend increasing hydroxyurea to 1000 mg daily In the first month, I plan to monitor his CBC closely weekly Once we are able to get him at the right dose, we will space out future appointment He will continue aspirin therapy

## 2022-08-30 NOTE — Progress Notes (Signed)
[place] OFFICE PROGRESS NOTE  Patient Care Team: Binnie Rail, MD as PCP - General (Internal Medicine) O'Neal, Cassie Freer, MD as PCP - Cardiology (Cardiology) Teena Irani, MD (Inactive) as Consulting Physician (Gastroenterology) Charlton Haws, [facility] as Pharmacist (Pharmacist) Warden Fillers, MD as Consulting Physician (Ophthalmology)  ASSESSMENT & PLAN:  Myeloproliferative neoplasm [facility]) He tolerated hydroxyurea well Even though leukocytosis has improved, it has not improved his platelet count I recommend increasing hydroxyurea to 1000 mg daily In the first month, I plan to monitor his CBC closely weekly Once we are able to get him at the right dose, we will space out future appointment He will continue aspirin therapy  No orders of the defined types were placed in this encounter.   All questions were answered. The patient knows to call the clinic with any problems, questions or concerns. The total time spent in the appointment was 20 minutes encounter with patients including review of chart and various tests results, discussions about plan of care and coordination of care plan   Mark Lark, MD 08/30/2022 1:42 PM  INTERVAL HISTORY: Please see below for problem oriented charting. 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 He tolerated hydroxyurea well No side effects whatsoever He continues to be very active with activities of daily living  REVIEW OF SYSTEMS:   Constitutional: Denies fevers, chills or abnormal weight loss Eyes: Denies blurriness of vision Ears, nose, mouth, throat, and face: Denies mucositis or sore throat Respiratory: Denies cough, dyspnea or wheezes Cardiovascular: Denies palpitation, chest discomfort or lower extremity swelling Gastrointestinal:  Denies nausea, heartburn or change in bowel habits Skin: Denies abnormal skin rashes Lymphatics: Denies new lymphadenopathy or easy  bruising Neurological:Denies numbness, tingling or new weaknesses Behavioral/Psych: Mood is stable, no new changes  All other systems were reviewed with the patient and are negative.  I have reviewed the past medical history, past surgical history, social history and family history with the patient and they are unchanged from previous note.  ALLERGIES:  is allergic to nitrofurantoin, penicillins, sulfonamide derivatives, amlodipine, shellfish allergy, and tamsulosin.  MEDICATIONS:  Current Outpatient Medications  Medication Sig Dispense Refill   amLODipine (NORVASC) 5 MG tablet Take 1 tablet (5 mg total) by mouth daily. 90 tablet 3   aspirin EC 81 MG tablet Take 81 mg by mouth daily. Swallow whole.     famotidine (PEPCID) 20 MG tablet TAKE 1 TABLET BY MOUTH AT  NIGHT 90 tablet 3   fexofenadine (ALLEGRA) 180 MG tablet Take 180 mg by mouth daily as needed (allergies).     fluticasone (FLONASE) 50 MCG/ACT nasal spray Place 2 sprays into the nose 2 (two) times daily as needed for rhinitis or allergies.     Hydrocortisone-Iodoquinol 1-1 % CREA Apply 1 application topically daily.     hydroxyurea (HYDREA) 500 MG capsule Take 2 capsules (1,000 mg total) by mouth daily. May take with food to minimize GI side effects. 30 capsule 1   Multiple Vitamins-Minerals (PRESERVISION AREDS PO) Take 1 tablet by mouth daily.     sertraline (ZOLOFT) 25 MG tablet TAKE 1 TABLET (25 MG TOTAL) BY MOUTH DAILY. 90 tablet 4   telmisartan-hydrochlorothiazide (MICARDIS HCT) 40-12.5 MG tablet Take 1 tablet by mouth daily. 90 tablet 3   No current facility-administered medications for this visit.    SUMMARY OF ONCOLOGIC HISTORY: Mark Copeland 88 y.o. male is here because of elevated WBC and platelet count  He was found to have abnormal  CBC from recent blood draw.  I have the opportunity to review his CBC dated back to several years On 05/23/2020, his CBC was normal with white count of 8.7, hemoglobin 16.7 and  platelet count of 351 On 06/05/2021, his CBC is not normal with white count of 9.2, hemoglobin of 15 and platelet count of 4 54 On June 07, 2022, white blood cell count is 11.5, hemoglobin 14.4 and platelet count 607 On 06/18/2022, white blood cell count is elevated at 12.7, hemoglobin 14.3 and platelet count of 592 On 07/17/2022, white count 12.6, hemoglobin 14.3 and platelet count 576 On August 13, 2022, molecular testing came back positive for  CAL-R On August 22, 2022, the patient is started on hydroxyurea  PHYSICAL EXAMINATION: ECOG PERFORMANCE STATUS: 0 - Asymptomatic  Vitals:   08/30/22 1327  BP: (!) 143/62  Pulse: 65  Resp: 18  Temp: 97.6 F (36.4 C)  SpO2: 99%   Filed Weights   08/30/22 1327  Weight: 169 lb 12.8 oz (77 kg)    GENERAL:alert, no distress and comfortable NEURO: alert & oriented x 3 with fluent speech, no focal motor/sensory deficits  LABORATORY DATA:  I have reviewed the data as listed    Component Value Date/Time   NA 139 06/07/2022 0847   K 4.0 06/07/2022 0847   CL 103 06/07/2022 0847   CO2 27 06/07/2022 0847   GLUCOSE 102 (H) 06/07/2022 0847   BUN 22 06/07/2022 0847   CREATININE 1.07 06/07/2022 0847   CREATININE 1.00 05/23/2020 0819   CALCIUM 9.8 06/07/2022 0847   PROT 6.9 06/07/2022 0847   ALBUMIN 4.7 06/07/2022 0847   AST 19 06/07/2022 0847   ALT 14 06/07/2022 0847   ALKPHOS 57 06/07/2022 0847   BILITOT 0.8 06/07/2022 0847   GFRNONAA >60 06/28/2018 0345   GFRAA >60 06/28/2018 0345    No results found for: "SPEP", "UPEP"  Lab Results  Component Value Date   WBC 13.0 (H) 08/30/2022   NEUTROABS 7.4 08/30/2022   HGB 14.1 08/30/2022   HCT 42.4 08/30/2022   MCV 89.5 08/30/2022   PLT 645 (H) 08/30/2022      Chemistry      Component Value Date/Time   NA 139 06/07/2022 0847   K 4.0 06/07/2022 0847   CL 103 06/07/2022 0847   CO2 27 06/07/2022 0847   BUN 22 06/07/2022 0847   CREATININE 1.07 06/07/2022 0847   CREATININE 1.00  05/23/2020 0819      Component Value Date/Time   CALCIUM 9.8 06/07/2022 0847   ALKPHOS 57 06/07/2022 0847   AST 19 06/07/2022 0847   ALT 14 06/07/2022 0847   BILITOT 0.8 06/07/2022 0847

## 2022-09-06 ENCOUNTER — Encounter: Payer: Self-pay | Admitting: Hematology and Oncology

## 2022-09-06 ENCOUNTER — Other Ambulatory Visit: Payer: Self-pay

## 2022-09-06 ENCOUNTER — Inpatient Hospital Stay: Payer: Medicare Other

## 2022-09-06 ENCOUNTER — Inpatient Hospital Stay: Payer: Medicare Other | Admitting: Hematology and Oncology

## 2022-09-06 VITALS — BP 162/59 | HR 63 | Temp 98.1°F | Resp 18 | Ht 70.0 in | Wt 171.0 lb

## 2022-09-06 DIAGNOSIS — D75839 Thrombocytosis, unspecified: Secondary | ICD-10-CM | POA: Diagnosis not present

## 2022-09-06 DIAGNOSIS — Z7982 Long term (current) use of aspirin: Secondary | ICD-10-CM | POA: Diagnosis not present

## 2022-09-06 DIAGNOSIS — Z87891 Personal history of nicotine dependence: Secondary | ICD-10-CM | POA: Diagnosis not present

## 2022-09-06 DIAGNOSIS — D471 Chronic myeloproliferative disease: Secondary | ICD-10-CM

## 2022-09-06 DIAGNOSIS — E860 Dehydration: Secondary | ICD-10-CM | POA: Diagnosis not present

## 2022-09-06 DIAGNOSIS — D72829 Elevated white blood cell count, unspecified: Secondary | ICD-10-CM | POA: Diagnosis not present

## 2022-09-06 DIAGNOSIS — I10 Essential (primary) hypertension: Secondary | ICD-10-CM | POA: Diagnosis not present

## 2022-09-06 DIAGNOSIS — D473 Essential (hemorrhagic) thrombocythemia: Secondary | ICD-10-CM | POA: Diagnosis not present

## 2022-09-06 LAB — CBC WITH DIFFERENTIAL/PLATELET
Abs Immature Granulocytes: 0.06 10*3/uL (ref 0.00–0.07)
Basophils Absolute: 0.2 10*3/uL — ABNORMAL HIGH (ref 0.0–0.1)
Basophils Relative: 2 %
Eosinophils Absolute: 0.8 10*3/uL — ABNORMAL HIGH (ref 0.0–0.5)
Eosinophils Relative: 9 %
HCT: 42.5 % (ref 39.0–52.0)
Hemoglobin: 14.1 g/dL (ref 13.0–17.0)
Immature Granulocytes: 1 %
Lymphocytes Relative: 33 %
Lymphs Abs: 2.9 10*3/uL (ref 0.7–4.0)
MCH: 29.6 pg (ref 26.0–34.0)
MCHC: 33.2 g/dL (ref 30.0–36.0)
MCV: 89.3 fL (ref 80.0–100.0)
Monocytes Absolute: 0.5 10*3/uL (ref 0.1–1.0)
Monocytes Relative: 6 %
Neutro Abs: 4.2 10*3/uL (ref 1.7–7.7)
Neutrophils Relative %: 49 %
Platelets: 509 10*3/uL — ABNORMAL HIGH (ref 150–400)
RBC: 4.76 MIL/uL (ref 4.22–5.81)
RDW: 14.5 % (ref 11.5–15.5)
WBC: 8.6 10*3/uL (ref 4.0–10.5)
nRBC: 0 % (ref 0.0–0.2)

## 2022-09-06 MED ORDER — HYDROXYUREA 500 MG PO CAPS: 1000.0000 mg | ORAL_CAPSULE | Freq: Every day | ORAL | 1 refills | Status: DC

## 2022-09-06 NOTE — Assessment & Plan Note (Signed)
His blood pressure is elevated today likely due to component of whitecoat hypertension Observe closely He is not symptomatic

## 2022-09-06 NOTE — Assessment & Plan Note (Signed)
He has excellent response to treatment He has no side effects so far Leukocytosis has resolved and his platelet count has improved He will continue current dose of hydroxyurea at 1000 mg daily along with aspirin I plan to see him again in 10 days for further follow-up

## 2022-09-06 NOTE — Progress Notes (Signed)
[place] OFFICE PROGRESS NOTE  Patient Care Team: Binnie Rail, MD as PCP - General (Internal Medicine) O'Neal, Cassie Freer, MD as PCP - Cardiology (Cardiology) Teena Irani, MD (Inactive) as Consulting Physician (Gastroenterology) Charlton Haws, [facility] as Pharmacist (Pharmacist) Warden Fillers, MD as Consulting Physician (Ophthalmology)  ASSESSMENT & PLAN:  Myeloproliferative neoplasm [facility]) He has excellent response to treatment He has no side effects so far Leukocytosis has resolved and his platelet count has improved He will continue current dose of hydroxyurea at 1000 mg daily along with aspirin I plan to see him again in 10 days for further follow-up  Hypertension His blood pressure is elevated today likely due to component of whitecoat hypertension Observe closely He is not symptomatic  No orders of the defined types were placed in this encounter.   All questions were answered. The patient knows to call the clinic with any problems, questions or concerns. The total time spent in the appointment was 20 minutes encounter with patients including review of chart and various tests results, discussions about plan of care and coordination of care plan   Heath Lark, MD 09/06/2022 11:02 AM  INTERVAL HISTORY: Please see below for problem oriented charting. he returns for treatment follow-up on hydroxyurea for myeloproliferative disorder He tolerated recent change of hydroxyurea dose without problems He continues to enjoy good quality of life  REVIEW OF SYSTEMS:   Constitutional: Denies fevers, chills or abnormal weight loss Eyes: Denies blurriness of vision Ears, nose, mouth, throat, and face: Denies mucositis or sore throat Respiratory: Denies cough, dyspnea or wheezes Cardiovascular: Denies palpitation, chest discomfort or lower extremity swelling Gastrointestinal:  Denies nausea, heartburn or change in bowel habits Skin: Denies abnormal skin  rashes Lymphatics: Denies new lymphadenopathy or easy bruising Neurological:Denies numbness, tingling or new weaknesses Behavioral/Psych: Mood is stable, no new changes  All other systems were reviewed with the patient and are negative.  I have reviewed the past medical history, past surgical history, social history and family history with the patient and they are unchanged from previous note.  ALLERGIES:  is allergic to nitrofurantoin, penicillins, sulfonamide derivatives, amlodipine, shellfish allergy, and tamsulosin.  MEDICATIONS:  Current Outpatient Medications  Medication Sig Dispense Refill   amLODipine (NORVASC) 5 MG tablet Take 1 tablet (5 mg total) by mouth daily. 90 tablet 3   aspirin EC 81 MG tablet Take 81 mg by mouth daily. Swallow whole.     famotidine (PEPCID) 20 MG tablet TAKE 1 TABLET BY MOUTH AT  NIGHT 90 tablet 3   fexofenadine (ALLEGRA) 180 MG tablet Take 180 mg by mouth daily as needed (allergies).     fluticasone (FLONASE) 50 MCG/ACT nasal spray Place 2 sprays into the nose 2 (two) times daily as needed for rhinitis or allergies.     Hydrocortisone-Iodoquinol 1-1 % CREA Apply 1 application topically daily.     hydroxyurea (HYDREA) 500 MG capsule Take 2 capsules (1,000 mg total) by mouth daily. May take with food to minimize GI side effects. 60 capsule 1   Multiple Vitamins-Minerals (PRESERVISION AREDS PO) Take 1 tablet by mouth daily.     sertraline (ZOLOFT) 25 MG tablet TAKE 1 TABLET (25 MG TOTAL) BY MOUTH DAILY. 90 tablet 4   telmisartan-hydrochlorothiazide (MICARDIS HCT) 40-12.5 MG tablet Take 1 tablet by mouth daily. 90 tablet 3   No current facility-administered medications for this visit.    SUMMARY OF ONCOLOGIC HISTORY: Mark Copeland 88 y.o. male is here because of elevated WBC and  platelet count  He was found to have abnormal CBC from recent blood draw.  I have the opportunity to review his CBC dated back to several years On 05/23/2020, his CBC was  normal with white count of 8.7, hemoglobin 16.7 and platelet count of 351 On 06/05/2021, his CBC is not normal with white count of 9.2, hemoglobin of 15 and platelet count of 4 54 On June 07, 2022, white blood cell count is 11.5, hemoglobin 14.4 and platelet count 607 On 06/18/2022, white blood cell count is elevated at 12.7, hemoglobin 14.3 and platelet count of 592 On 07/17/2022, white count 12.6, hemoglobin 14.3 and platelet count 576 On August 13, 2022, molecular testing came back positive for  CAL-R On August 22, 2022, the patient is started on hydroxyurea  PHYSICAL EXAMINATION: ECOG PERFORMANCE STATUS: 0 - Asymptomatic  Vitals:   09/06/22 0917  BP: (!) 162/59  Pulse: 63  Resp: 18  Temp: 98.1 F (36.7 C)  SpO2: 100%   Filed Weights   09/06/22 0917  Weight: 171 lb (77.6 kg)    GENERAL:alert, no distress and comfortable SKIN: skin color, texture, turgor are normal, no rashes or significant lesions EYES: normal, Conjunctiva are pink and non-injected, sclera clear OROPHARYNX:no exudate, no erythema and lips, buccal mucosa, and tongue normal  NECK: supple, thyroid normal size, non-tender, without nodularity LYMPH:  no palpable lymphadenopathy in the cervical, axillary or inguinal LUNGS: clear to auscultation and percussion with normal breathing effort HEART: regular rate & rhythm and no murmurs and no lower extremity edema ABDOMEN:abdomen soft, non-tender and normal bowel sounds Musculoskeletal:no cyanosis of digits and no clubbing  NEURO: alert & oriented x 3 with fluent speech, no focal motor/sensory deficits  LABORATORY DATA:  I have reviewed the data as listed    Component Value Date/Time   NA 139 06/07/2022 0847   K 4.0 06/07/2022 0847   CL 103 06/07/2022 0847   CO2 27 06/07/2022 0847   GLUCOSE 102 (H) 06/07/2022 0847   BUN 22 06/07/2022 0847   CREATININE 1.07 06/07/2022 0847   CREATININE 1.00 05/23/2020 0819   CALCIUM 9.8 06/07/2022 0847   PROT 6.9 06/07/2022 0847    ALBUMIN 4.7 06/07/2022 0847   AST 19 06/07/2022 0847   ALT 14 06/07/2022 0847   ALKPHOS 57 06/07/2022 0847   BILITOT 0.8 06/07/2022 0847   GFRNONAA >60 06/28/2018 0345   GFRAA >60 06/28/2018 0345    No results found for: "SPEP", "UPEP"  Lab Results  Component Value Date   WBC 8.6 09/06/2022   NEUTROABS 4.2 09/06/2022   HGB 14.1 09/06/2022   HCT 42.5 09/06/2022   MCV 89.3 09/06/2022   PLT 509 (H) 09/06/2022      Chemistry      Component Value Date/Time   NA 139 06/07/2022 0847   K 4.0 06/07/2022 0847   CL 103 06/07/2022 0847   CO2 27 06/07/2022 0847   BUN 22 06/07/2022 0847   CREATININE 1.07 06/07/2022 0847   CREATININE 1.00 05/23/2020 0819      Component Value Date/Time   CALCIUM 9.8 06/07/2022 0847   ALKPHOS 57 06/07/2022 0847   AST 19 06/07/2022 0847   ALT 14 06/07/2022 0847   BILITOT 0.8 06/07/2022 0847

## 2022-09-16 ENCOUNTER — Inpatient Hospital Stay: Payer: Medicare Other | Admitting: Hematology and Oncology

## 2022-09-16 ENCOUNTER — Inpatient Hospital Stay: Payer: Medicare Other | Attending: Hematology and Oncology

## 2022-09-16 ENCOUNTER — Other Ambulatory Visit: Payer: Self-pay

## 2022-09-16 ENCOUNTER — Encounter: Payer: Self-pay | Admitting: Hematology and Oncology

## 2022-09-16 VITALS — BP 131/60 | HR 62 | Resp 18 | Ht 70.0 in | Wt 172.0 lb

## 2022-09-16 DIAGNOSIS — D72829 Elevated white blood cell count, unspecified: Secondary | ICD-10-CM | POA: Insufficient documentation

## 2022-09-16 DIAGNOSIS — Z7982 Long term (current) use of aspirin: Secondary | ICD-10-CM | POA: Diagnosis not present

## 2022-09-16 DIAGNOSIS — Z79899 Other long term (current) drug therapy: Secondary | ICD-10-CM | POA: Insufficient documentation

## 2022-09-16 DIAGNOSIS — D471 Chronic myeloproliferative disease: Secondary | ICD-10-CM

## 2022-09-16 LAB — CBC WITH DIFFERENTIAL/PLATELET
Abs Immature Granulocytes: 0.04 10*3/uL (ref 0.00–0.07)
Basophils Absolute: 0.1 10*3/uL (ref 0.0–0.1)
Basophils Relative: 2 %
Eosinophils Absolute: 0.4 10*3/uL (ref 0.0–0.5)
Eosinophils Relative: 5 %
HCT: 41 % (ref 39.0–52.0)
Hemoglobin: 13.9 g/dL (ref 13.0–17.0)
Immature Granulocytes: 1 %
Lymphocytes Relative: 26 %
Lymphs Abs: 2.2 10*3/uL (ref 0.7–4.0)
MCH: 30.6 pg (ref 26.0–34.0)
MCHC: 33.9 g/dL (ref 30.0–36.0)
MCV: 90.3 fL (ref 80.0–100.0)
Monocytes Absolute: 0.8 10*3/uL (ref 0.1–1.0)
Monocytes Relative: 10 %
Neutro Abs: 4.8 10*3/uL (ref 1.7–7.7)
Neutrophils Relative %: 56 %
Platelets: 361 10*3/uL (ref 150–400)
RBC: 4.54 MIL/uL (ref 4.22–5.81)
RDW: 15.7 % — ABNORMAL HIGH (ref 11.5–15.5)
WBC: 8.4 10*3/uL (ref 4.0–10.5)
nRBC: 0 % (ref 0.0–0.2)

## 2022-09-16 NOTE — Progress Notes (Signed)
[place] OFFICE PROGRESS NOTE  Patient Care Team: Binnie Rail, MD as PCP - General (Internal Medicine) O'Neal, Cassie Freer, MD as PCP - Cardiology (Cardiology) Teena Irani, MD (Inactive) as Consulting Physician (Gastroenterology) Charlton Haws, [facility] as Pharmacist (Pharmacist) Warden Fillers, MD as Consulting Physician (Ophthalmology)  ASSESSMENT & PLAN:  Myeloproliferative neoplasm [facility]) He has excellent response to treatment with normalization of his CBC in less than a month since he started taking hydroxyurea He has no side effects so far He will continue current dose of hydroxyurea at 1000 mg daily along with aspirin I plan to see him again in a few weeks for further follow-up We discussed future follow-up If his next blood work remain normal, we will space out his future appointment We discussed situations that could cause excessive bone marrow suppression such as severe infection or surgery and in those circumstances, the patient has my permission to hold hydroxyurea and to call us after that  No orders of the defined types were placed in this encounter.   All questions were answered. The patient knows to call the clinic with any problems, questions or concerns. The total time spent in the appointment was 20 minutes encounter with patients including review of chart and various tests results, discussions about plan of care and coordination of care plan   Heath Lark, MD 09/16/2022 11:00 AM  INTERVAL HISTORY: Please see below for problem oriented charting. he returns for treatment follow-up on hydroxyurea for myeloproliferative disorder He is doing well He has some mild knee pain secondary to a fall several weeks ago and has appointment to see orthopedic doctor  REVIEW OF SYSTEMS:   Constitutional: Denies fevers, chills or abnormal weight loss Eyes: Denies blurriness of vision Ears, nose, mouth, throat, and face: Denies mucositis or sore  throat Respiratory: Denies cough, dyspnea or wheezes Cardiovascular: Denies palpitation, chest discomfort or lower extremity swelling Gastrointestinal:  Denies nausea, heartburn or change in bowel habits Skin: Denies abnormal skin rashes Lymphatics: Denies new lymphadenopathy or easy bruising Neurological:Denies numbness, tingling or new weaknesses Behavioral/Psych: Mood is stable, no new changes  All other systems were reviewed with the patient and are negative.  I have reviewed the past medical history, past surgical history, social history and family history with the patient and they are unchanged from previous note.  ALLERGIES:  is allergic to nitrofurantoin, penicillins, sulfonamide derivatives, amlodipine, shellfish allergy, and tamsulosin.  MEDICATIONS:  Current Outpatient Medications  Medication Sig Dispense Refill   amLODipine (NORVASC) 5 MG tablet Take 1 tablet (5 mg total) by mouth daily. 90 tablet 3   aspirin EC 81 MG tablet Take 81 mg by mouth daily. Swallow whole.     famotidine (PEPCID) 20 MG tablet TAKE 1 TABLET BY MOUTH AT  NIGHT 90 tablet 3   fexofenadine (ALLEGRA) 180 MG tablet Take 180 mg by mouth daily as needed (allergies).     fluticasone (FLONASE) 50 MCG/ACT nasal spray Place 2 sprays into the nose 2 (two) times daily as needed for rhinitis or allergies.     Hydrocortisone-Iodoquinol 1-1 % CREA Apply 1 application topically daily.     hydroxyurea (HYDREA) 500 MG capsule Take 2 capsules (1,000 mg total) by mouth daily. May take with food to minimize GI side effects. 60 capsule 1   Multiple Vitamins-Minerals (PRESERVISION AREDS PO) Take 1 tablet by mouth daily.     sertraline (ZOLOFT) 25 MG tablet TAKE 1 TABLET (25 MG TOTAL) BY MOUTH DAILY. 90 tablet 4  telmisartan-hydrochlorothiazide (MICARDIS HCT) 40-12.5 MG tablet Take 1 tablet by mouth daily. 90 tablet 3   No current facility-administered medications for this visit.    SUMMARY OF ONCOLOGIC HISTORY: CUTBERTO WINFREE 88 y.o. male is here because of elevated WBC and platelet count  He was found to have abnormal CBC from recent blood draw.  I have the opportunity to review his CBC dated back to several years On 05/23/2020, his CBC was normal with white count of 8.7, hemoglobin 16.7 and platelet count of 351 On 06/05/2021, his CBC is not normal with white count of 9.2, hemoglobin of 15 and platelet count of 4 54 On June 07, 2022, white blood cell count is 11.5, hemoglobin 14.4 and platelet count 607 On 06/18/2022, white blood cell count is elevated at 12.7, hemoglobin 14.3 and platelet count of 592 On 07/17/2022, white count 12.6, hemoglobin 14.3 and platelet count 576 On August 13, 2022, molecular testing came back positive for  CAL-R On August 22, 2022, the patient is started on hydroxyurea, initially at 500 mg daily but subsequently increased to 1000 mg daily  PHYSICAL EXAMINATION: ECOG PERFORMANCE STATUS: 0 - Asymptomatic  Vitals:   09/16/22 1033  BP: 131/60  Pulse: 62  Resp: 18  SpO2: 100%   Filed Weights   09/16/22 1033  Weight: 172 lb (78 kg)    GENERAL:alert, no distress and comfortable NEURO: alert & oriented x 3 with fluent speech, no focal motor/sensory deficits  LABORATORY DATA:  I have reviewed the data as listed    Component Value Date/Time   NA 139 06/07/2022 0847   K 4.0 06/07/2022 0847   CL 103 06/07/2022 0847   CO2 27 06/07/2022 0847   GLUCOSE 102 (H) 06/07/2022 0847   BUN 22 06/07/2022 0847   CREATININE 1.07 06/07/2022 0847   CREATININE 1.00 05/23/2020 0819   CALCIUM 9.8 06/07/2022 0847   PROT 6.9 06/07/2022 0847   ALBUMIN 4.7 06/07/2022 0847   AST 19 06/07/2022 0847   ALT 14 06/07/2022 0847   ALKPHOS 57 06/07/2022 0847   BILITOT 0.8 06/07/2022 0847   GFRNONAA >60 06/28/2018 0345   GFRAA >60 06/28/2018 0345    No results found for: "SPEP", "UPEP"  Lab Results  Component Value Date   WBC 8.4 09/16/2022   NEUTROABS 4.8 09/16/2022   HGB 13.9 09/16/2022    HCT 41.0 09/16/2022   MCV 90.3 09/16/2022   PLT 361 09/16/2022      Chemistry      Component Value Date/Time   NA 139 06/07/2022 0847   K 4.0 06/07/2022 0847   CL 103 06/07/2022 0847   CO2 27 06/07/2022 0847   BUN 22 06/07/2022 0847   CREATININE 1.07 06/07/2022 0847   CREATININE 1.00 05/23/2020 0819      Component Value Date/Time   CALCIUM 9.8 06/07/2022 0847   ALKPHOS 57 06/07/2022 0847   AST 19 06/07/2022 0847   ALT 14 06/07/2022 0847   BILITOT 0.8 06/07/2022 0847

## 2022-09-16 NOTE — Assessment & Plan Note (Addendum)
He has excellent response to treatment with normalization of his CBC in less than a month since he started taking hydroxyurea He has no side effects so far He will continue current dose of hydroxyurea at 1000 mg daily along with aspirin I plan to see him again in a few weeks for further follow-up We discussed future follow-up If his next blood work remain normal, we will space out his future appointment We discussed situations that could cause excessive bone marrow suppression such as severe infection or surgery and in those circumstances, the patient has my permission to hold hydroxyurea and to call us after that

## 2022-09-19 DIAGNOSIS — M25562 Pain in left knee: Secondary | ICD-10-CM | POA: Diagnosis not present

## 2022-09-20 ENCOUNTER — Telehealth: Payer: Self-pay

## 2022-09-20 NOTE — Telephone Encounter (Signed)
Returned call to wife. She is concerned about Mark Copeland. He had a low grade fever yesterday and today temperature is 100.8. He has take tylenol. He is having chills and shivering a lot. Denies respiratory symptoms. A few episodes of diarrhea yesterday and today. Wife is concerned about taking the Hydrea.  Called back and per Dr. Alvy Bimler, instructed to hold off on taking Hydrea until evaluated. Instructed go to urgent care to be evaluated and call the office back with update on Monday. Wife verbalized understanding.

## 2022-09-22 DIAGNOSIS — R509 Fever, unspecified: Secondary | ICD-10-CM | POA: Diagnosis not present

## 2022-09-24 ENCOUNTER — Telehealth: Payer: Self-pay

## 2022-09-24 NOTE — Telephone Encounter (Signed)
Called and given below message. She verbalized understanding. 

## 2022-09-24 NOTE — Telephone Encounter (Signed)
Called to follow up from 11/10 call. Spoke with wife. Home covid test was negative. On Sunday they went to [facility] and had repeat covid test and flu, both negative. Mark Copeland is still having fever / chills/shivering mainly at night. Temperature last night was 101.7, he takes tylenol. He will have video visit tomorrow with Dr. Quay Burow. Wife does not have any symptoms. Mark Copeland has continued taking Hydrea after the office instructed them to stop on Friday. He did not take today or Sunday. He will stop the Hydrea until instructed to continue.

## 2022-09-24 NOTE — Telephone Encounter (Signed)
-----   Message from Heath Lark, MD sent at 09/24/2022 11:37 AM EST ----- Can you call and check how he is doing/

## 2022-09-24 NOTE — Progress Notes (Unsigned)
Virtual Visit via Video Note  I connected with Mark Copeland on 09/25/22 at 11:00 AM EST by a video enabled telemedicine application and verified that I am speaking with the correct person using two identifiers.   I discussed the limitations of evaluation and management by telemedicine and the availability of in person appointments. The patient expressed understanding and agreed to proceed.  Present for the visit:  Myself, Dr Billey Gosling, [name] and his wife [name].  The patient is currently at home and I am in the office.    No referring provider.    History of Present Illness: He is here for an acute visit for recent symptoms   The end of last week he started experiencing fevers, chills and sweats.  He had a temperature of 100-100.8.  He also have diarrhea.  He did not feel well.  He had no other cold symptoms.  [facility] and his vital signs were all normal.  His exam was normal.  His COVID and flu test were negative.    He has been taking hydroxyurea for myeloproliferative disease and was doing well up until just recently.  He knew the possible side effects and he was advised that he could stop the medication if he developed any and he stopped the medication.  All of the symptoms went away and he feels great.  He has a follow-up with heme-onc in a few days.  Blood pressure this morning was 111/63, heart rate 76.  Temperature is 98.   Review of Systems  Constitutional:  Positive for chills, diaphoresis and fever.      Social History   Socioeconomic History   Marital status: Married    Spouse name: Not on file   Number of children: 1   Years of education: Not on file   Highest education level: Not on file  Occupational History   Not on file  Tobacco Use   Smoking status: Former    Types: Cigarettes    Quit date: 08/04/1960    Years since quitting: 62.1   Smokeless tobacco: Never   Tobacco comments:    smoked 1958-1961, up to 2  cigars / day  Substance and Sexual Activity   Alcohol use: Yes    Comment:  occasional wine   Drug use: No   Sexual activity: Not Currently  Other Topics Concern   Not on file  Social History Narrative   Retired Electrical engineer      Social Determinants of Health   Financial Resource Strain: [place]  (04/09/2022)   Overall Financial Resource Strain (CARDIA)    Difficulty of Paying Living Expenses: Not hard at all  Food Insecurity: No Food Insecurity (04/09/2022)   Hunger Vital Sign    Worried About Running Out of Food in the Last Year: Never true    [place] in the Last Year: Never true  Transportation Needs: No Transportation Needs (04/09/2022)   PRAPARE - Hydrologist (Medical): No    Lack of Transportation (Non-Medical): No  Physical Activity: Sufficiently Active (04/09/2022)   Exercise Vital Sign    Days of Exercise per Week: 7 days    Minutes of Exercise per Session: 60 min  Stress: No Stress Concern Present (04/09/2022)   [place]    Feeling of Stress : Not at all  Social Connections: [place] (04/09/2022)   Social Connection  and Isolation Panel [NHANES]    Frequency of Communication with Friends and Family: More than three times a week    Frequency of Social Gatherings with Friends and Family: More than three times a week    Attends Religious Services: More than 4 times per year    Active Member of Clubs or Organizations: Yes    Attends Music therapist: More than 4 times per year    Marital Status: Married     Observations/Objective: Appears well in NAD Breathing normally Skin is warm and dry  Assessment and Plan:  Side effect secondary to hydroxyurea: The end of last week experienced fevers, chills, sweats and diarrhea-infection ruled out at urgent care Secondary to hydroxyurea-once stopped the medication the symptoms all went away Has  follow-up with heme-onc  See Problem List for Assessment and Plan of chronic medical problems.   Follow Up Instructions:    I discussed the assessment and treatment plan with the patient. The patient was provided an opportunity to ask questions and all were answered. The patient agreed with the plan and demonstrated an understanding of the instructions.   The patient was advised to call back or seek an in-person evaluation if the symptoms worsen or if the condition fails to improve as anticipated.    Binnie Rail, MD

## 2022-09-24 NOTE — Telephone Encounter (Signed)
Dr. Quay Burow is an excellent physician No hydrea until further notice Keep appt as scheduled

## 2022-09-25 ENCOUNTER — Telehealth (INDEPENDENT_AMBULATORY_CARE_PROVIDER_SITE_OTHER): Payer: Medicare Other | Admitting: Internal Medicine

## 2022-09-25 ENCOUNTER — Encounter: Payer: Self-pay | Admitting: Internal Medicine

## 2022-09-25 ENCOUNTER — Telehealth: Payer: Self-pay | Admitting: *Deleted

## 2022-09-25 DIAGNOSIS — T887XXA Unspecified adverse effect of drug or medicament, initial encounter: Secondary | ICD-10-CM | POA: Diagnosis not present

## 2022-09-25 DIAGNOSIS — I10 Essential (primary) hypertension: Secondary | ICD-10-CM

## 2022-09-25 NOTE — Telephone Encounter (Signed)
Ms. Mark Copeland called office LVM with update: Mark Copeland is much better. His temperature is down now. He is not taking the medicine anymore. He had a video appt today with Dr. Quay Burow and it went well. They wanted to let Dr Alvy Bimler and Hassan Rowan know how much they appreciated all their help.

## 2022-09-25 NOTE — Assessment & Plan Note (Signed)
Chronic Blood pressure very well controlled at home-111/63 this morning Continue telmisartan-HCT 40-12.5 mg daily

## 2022-09-26 NOTE — Telephone Encounter (Signed)
Hi Mark Copeland,  He can resume hydrea, I will see him as scheduled

## 2022-09-26 NOTE — Telephone Encounter (Signed)
Called and given message to wife. She verbalized understanding.

## 2022-09-27 ENCOUNTER — Telehealth: Payer: Self-pay

## 2022-09-27 NOTE — Telephone Encounter (Signed)
Called wife back and given below message. She verbalized understanding and will continue medication. She will call the office back for questions/concerns.

## 2022-09-27 NOTE — Telephone Encounter (Signed)
Please let her know 75 is not fever Fever is defined as 101 or higher

## 2022-09-27 NOTE — Telephone Encounter (Signed)
Wife called and left a message. Bill restarted the Hydrea yesterday. He had fever last night up to 99.4 and took tylenol. Wife is concerned that the Hydrea is causing the fever.

## 2022-09-29 ENCOUNTER — Other Ambulatory Visit: Payer: Self-pay | Admitting: Hematology and Oncology

## 2022-10-07 ENCOUNTER — Other Ambulatory Visit: Payer: Self-pay

## 2022-10-07 ENCOUNTER — Inpatient Hospital Stay: Payer: Medicare Other | Admitting: Hematology and Oncology

## 2022-10-07 ENCOUNTER — Encounter: Payer: Self-pay | Admitting: Hematology and Oncology

## 2022-10-07 ENCOUNTER — Inpatient Hospital Stay: Payer: Medicare Other

## 2022-10-07 VITALS — BP 139/72 | HR 82 | Temp 97.6°F | Resp 18 | Ht 70.0 in | Wt 165.0 lb

## 2022-10-07 DIAGNOSIS — Z79899 Other long term (current) drug therapy: Secondary | ICD-10-CM | POA: Diagnosis not present

## 2022-10-07 DIAGNOSIS — D72825 Bandemia: Secondary | ICD-10-CM

## 2022-10-07 DIAGNOSIS — D72829 Elevated white blood cell count, unspecified: Secondary | ICD-10-CM | POA: Diagnosis not present

## 2022-10-07 DIAGNOSIS — D471 Chronic myeloproliferative disease: Secondary | ICD-10-CM | POA: Diagnosis not present

## 2022-10-07 DIAGNOSIS — Z7982 Long term (current) use of aspirin: Secondary | ICD-10-CM | POA: Diagnosis not present

## 2022-10-07 LAB — CBC WITH DIFFERENTIAL/PLATELET
Abs Immature Granulocytes: 0.1 10*3/uL — ABNORMAL HIGH (ref 0.00–0.07)
Basophils Absolute: 0.2 10*3/uL — ABNORMAL HIGH (ref 0.0–0.1)
Basophils Relative: 2 %
Eosinophils Absolute: 0.3 10*3/uL (ref 0.0–0.5)
Eosinophils Relative: 3 %
HCT: 40.7 % (ref 39.0–52.0)
Hemoglobin: 13.7 g/dL (ref 13.0–17.0)
Immature Granulocytes: 1 %
Lymphocytes Relative: 29 %
Lymphs Abs: 3.4 10*3/uL (ref 0.7–4.0)
MCH: 30.9 pg (ref 26.0–34.0)
MCHC: 33.7 g/dL (ref 30.0–36.0)
MCV: 91.9 fL (ref 80.0–100.0)
Monocytes Absolute: 0.9 10*3/uL (ref 0.1–1.0)
Monocytes Relative: 8 %
Neutro Abs: 7.1 10*3/uL (ref 1.7–7.7)
Neutrophils Relative %: 57 %
Platelets: 354 10*3/uL (ref 150–400)
RBC: 4.43 MIL/uL (ref 4.22–5.81)
RDW: 17.2 % — ABNORMAL HIGH (ref 11.5–15.5)
WBC: 12 10*3/uL — ABNORMAL HIGH (ref 4.0–10.5)
nRBC: 0 % (ref 0.0–0.2)

## 2022-10-07 NOTE — Assessment & Plan Note (Signed)
He has excellent response to treatment with near normalization of his CBC  I have reviewed side effects with him and his wife again I do not believe his recent fever is related He will continue current dose of hydroxyurea at 1000 mg daily along with aspirin I plan to see him again in a few weeks for further follow-up We discussed future follow-up If his next blood work remain normal, we will space out his future appointment We discussed situations that could cause excessive bone marrow suppression such as severe infection or surgery and in those circumstances, the patient has my permission to hold hydroxyurea and to call us after that

## 2022-10-07 NOTE — Progress Notes (Signed)
[place] OFFICE PROGRESS NOTE  Patient Care Team: Binnie Rail, MD as PCP - General (Internal Medicine) O'Neal, Cassie Freer, MD as PCP - Cardiology (Cardiology) Teena Irani, MD (Inactive) as Consulting Physician (Gastroenterology) Charlton Haws, [facility] as Pharmacist (Pharmacist) Warden Fillers, MD as Consulting Physician (Ophthalmology)  ASSESSMENT & PLAN:  Myeloproliferative neoplasm [facility]) He has excellent response to treatment with near normalization of his CBC  I have reviewed side effects with him and his wife again I do not believe his recent fever is related He will continue current dose of hydroxyurea at 1000 mg daily along with aspirin I plan to see him again in a few weeks for further follow-up We discussed future follow-up If his next blood work remain normal, we will space out his future appointment We discussed situations that could cause excessive bone marrow suppression such as severe infection or surgery and in those circumstances, the patient has my permission to hold hydroxyurea and to call us after that  Leukocytosis Could be related to his condition He is not symptomatic Observe only  No orders of the defined types were placed in this encounter.   All questions were answered. The patient knows to call the clinic with any problems, questions or concerns. The total time spent in the appointment was 20 minutes encounter with patients including review of chart and various tests results, discussions about plan of care and coordination of care plan   Heath Lark, MD 10/07/2022 3:11 PM  INTERVAL HISTORY: Please see below for problem oriented charting. he returns for treatment follow-up with his wife He missed 3 days of therapy due to a viral illness which has since resolved He has several questions related to side effects of treatment  REVIEW OF SYSTEMS:   Constitutional: Denies fevers, chills or abnormal weight loss Eyes: Denies  blurriness of vision Ears, nose, mouth, throat, and face: Denies mucositis or sore throat Respiratory: Denies cough, dyspnea or wheezes Cardiovascular: Denies palpitation, chest discomfort or lower extremity swelling Gastrointestinal:  Denies nausea, heartburn or change in bowel habits Skin: Denies abnormal skin rashes Lymphatics: Denies new lymphadenopathy or easy bruising Neurological:Denies numbness, tingling or new weaknesses Behavioral/Psych: Mood is stable, no new changes  All other systems were reviewed with the patient and are negative.  I have reviewed the past medical history, past surgical history, social history and family history with the patient and they are unchanged from previous note.  ALLERGIES:  is allergic to nitrofurantoin, penicillins, sulfonamide derivatives, amlodipine, shellfish allergy, and tamsulosin.  MEDICATIONS:  Current Outpatient Medications  Medication Sig Dispense Refill   amLODipine (NORVASC) 5 MG tablet Take 1 tablet (5 mg total) by mouth daily. 90 tablet 3   aspirin EC 81 MG tablet Take 81 mg by mouth daily. Swallow whole.     famotidine (PEPCID) 20 MG tablet TAKE 1 TABLET BY MOUTH AT  NIGHT 90 tablet 3   fexofenadine (ALLEGRA) 180 MG tablet Take 180 mg by mouth daily as needed (allergies).     fluticasone (FLONASE) 50 MCG/ACT nasal spray Place 2 sprays into the nose 2 (two) times daily as needed for rhinitis or allergies.     Hydrocortisone-Iodoquinol 1-1 % CREA Apply 1 application topically daily.     hydroxyurea (HYDREA) 500 MG capsule TAKE 2 CAPSULES (1,000 MG TOTAL) BY MOUTH DAILY. MAY TAKE WITH FOOD TO MINIMIZE GI SIDE EFFECTS. 180 capsule 1   Multiple Vitamins-Minerals (PRESERVISION AREDS PO) Take 1 tablet by mouth daily.  sertraline (ZOLOFT) 25 MG tablet TAKE 1 TABLET (25 MG TOTAL) BY MOUTH DAILY. 90 tablet 4   telmisartan-hydrochlorothiazide (MICARDIS HCT) 40-12.5 MG tablet Take 1 tablet by mouth daily. 90 tablet 3   No current  facility-administered medications for this visit.    SUMMARY OF ONCOLOGIC HISTORY: AVON MERGENTHALER 88 y.o. male is here because of elevated WBC and platelet count  He was found to have abnormal CBC from recent blood draw.  I have the opportunity to review his CBC dated back to several years On 05/23/2020, his CBC was normal with white count of 8.7, hemoglobin 16.7 and platelet count of 351 On 06/05/2021, his CBC is not normal with white count of 9.2, hemoglobin of 15 and platelet count of 4 54 On June 07, 2022, white blood cell count is 11.5, hemoglobin 14.4 and platelet count 607 On 06/18/2022, white blood cell count is elevated at 12.7, hemoglobin 14.3 and platelet count of 592 On 07/17/2022, white count 12.6, hemoglobin 14.3 and platelet count 576 On August 13, 2022, molecular testing came back positive for  CAL-R On August 22, 2022, the patient is started on hydroxyurea, initially at 500 mg daily but subsequently increased to 1000 mg daily PHYSICAL EXAMINATION: ECOG PERFORMANCE STATUS: 0 - Asymptomatic  Vitals:   10/07/22 1131  BP: 139/72  Pulse: 82  Resp: 18  Temp: 97.6 F (36.4 C)  SpO2: 100%   Filed Weights   10/07/22 1131  Weight: 165 lb (74.8 kg)    GENERAL:alert, no distress and comfortable NEURO: alert & oriented x 3 with fluent speech, no focal motor/sensory deficits  LABORATORY DATA:  I have reviewed the data as listed    Component Value Date/Time   NA 139 06/07/2022 0847   K 4.0 06/07/2022 0847   CL 103 06/07/2022 0847   CO2 27 06/07/2022 0847   GLUCOSE 102 (H) 06/07/2022 0847   BUN 22 06/07/2022 0847   CREATININE 1.07 06/07/2022 0847   CREATININE 1.00 05/23/2020 0819   CALCIUM 9.8 06/07/2022 0847   PROT 6.9 06/07/2022 0847   ALBUMIN 4.7 06/07/2022 0847   AST 19 06/07/2022 0847   ALT 14 06/07/2022 0847   ALKPHOS 57 06/07/2022 0847   BILITOT 0.8 06/07/2022 0847   GFRNONAA >60 06/28/2018 0345   GFRAA >60 06/28/2018 0345    No results found for:  "SPEP", "UPEP"  Lab Results  Component Value Date   WBC 12.0 (H) 10/07/2022   NEUTROABS 7.1 10/07/2022   HGB 13.7 10/07/2022   HCT 40.7 10/07/2022   MCV 91.9 10/07/2022   PLT 354 10/07/2022      Chemistry      Component Value Date/Time   NA 139 06/07/2022 0847   K 4.0 06/07/2022 0847   CL 103 06/07/2022 0847   CO2 27 06/07/2022 0847   BUN 22 06/07/2022 0847   CREATININE 1.07 06/07/2022 0847   CREATININE 1.00 05/23/2020 0819      Component Value Date/Time   CALCIUM 9.8 06/07/2022 0847   ALKPHOS 57 06/07/2022 0847   AST 19 06/07/2022 0847   ALT 14 06/07/2022 0847   BILITOT 0.8 06/07/2022 0847

## 2022-10-07 NOTE — Assessment & Plan Note (Signed)
Could be related to his condition He is not symptomatic Observe only

## 2022-11-14 ENCOUNTER — Other Ambulatory Visit: Payer: Self-pay

## 2022-11-14 ENCOUNTER — Encounter: Payer: Self-pay | Admitting: Internal Medicine

## 2022-11-14 MED ORDER — SERTRALINE HCL 25 MG PO TABS: 25.0000 mg | ORAL_TABLET | Freq: Every day | ORAL | 4 refills | Status: DC

## 2022-11-26 ENCOUNTER — Encounter: Payer: Self-pay | Admitting: Hematology and Oncology

## 2022-11-26 ENCOUNTER — Inpatient Hospital Stay: Payer: Medicare Other | Attending: Hematology and Oncology

## 2022-11-26 ENCOUNTER — Inpatient Hospital Stay: Payer: Medicare Other | Admitting: Hematology and Oncology

## 2022-11-26 ENCOUNTER — Other Ambulatory Visit: Payer: Self-pay

## 2022-11-26 VITALS — BP 149/69 | HR 63 | Temp 97.4°F | Resp 18 | Ht 70.0 in | Wt 170.0 lb

## 2022-11-26 DIAGNOSIS — Z7982 Long term (current) use of aspirin: Secondary | ICD-10-CM | POA: Diagnosis not present

## 2022-11-26 DIAGNOSIS — Z79899 Other long term (current) drug therapy: Secondary | ICD-10-CM | POA: Diagnosis not present

## 2022-11-26 DIAGNOSIS — D471 Chronic myeloproliferative disease: Secondary | ICD-10-CM

## 2022-11-26 LAB — CBC WITH DIFFERENTIAL/PLATELET
Abs Immature Granulocytes: 0.03 10*3/uL (ref 0.00–0.07)
Basophils Absolute: 0.1 10*3/uL (ref 0.0–0.1)
Basophils Relative: 2 %
Eosinophils Absolute: 0.2 10*3/uL (ref 0.0–0.5)
Eosinophils Relative: 3 %
HCT: 39.3 % (ref 39.0–52.0)
Hemoglobin: 13.8 g/dL (ref 13.0–17.0)
Immature Granulocytes: 1 %
Lymphocytes Relative: 37 %
Lymphs Abs: 2.4 10*3/uL (ref 0.7–4.0)
MCH: 35.2 pg — ABNORMAL HIGH (ref 26.0–34.0)
MCHC: 35.1 g/dL (ref 30.0–36.0)
MCV: 100.3 fL — ABNORMAL HIGH (ref 80.0–100.0)
Monocytes Absolute: 0.5 10*3/uL (ref 0.1–1.0)
Monocytes Relative: 8 %
Neutro Abs: 3.3 10*3/uL (ref 1.7–7.7)
Neutrophils Relative %: 49 %
Platelets: 244 10*3/uL (ref 150–400)
RBC: 3.92 MIL/uL — ABNORMAL LOW (ref 4.22–5.81)
RDW: 21.5 % — ABNORMAL HIGH (ref 11.5–15.5)
WBC: 6.6 10*3/uL (ref 4.0–10.5)
nRBC: 0 % (ref 0.0–0.2)

## 2022-11-26 NOTE — Progress Notes (Signed)
[place] OFFICE PROGRESS NOTE  Patient Care Team: Binnie Rail, MD as PCP - General (Internal Medicine) O'Neal, Cassie Freer, MD as PCP - Cardiology (Cardiology) Teena Irani, MD (Inactive) as Consulting Physician (Gastroenterology) Charlton Haws, [facility] as Pharmacist (Pharmacist) Warden Fillers, MD as Consulting Physician (Ophthalmology)  ASSESSMENT & PLAN:  Myeloproliferative neoplasm [facility]) He has excellent response to treatment with near normalization of his CBC  He will continue current dose of hydroxyurea at 1000 mg daily along with aspirin I plan to see him again in 3 months for further follow-up We discussed risk of infection while on treatment and I think it is a wise decision that he cancel his planned vacation I will be willing to help him complete paperwork so that he can get refund of his paid furcation We discussed situations that could cause excessive bone marrow suppression such as severe infection or surgery and in those circumstances, the patient has my permission to hold hydroxyurea and to call us after that  No orders of the defined types were placed in this encounter.   All questions were answered. The patient knows to call the clinic with any problems, questions or concerns. The total time spent in the appointment was 20 minutes encounter with patients including review of chart and various tests results, discussions about plan of care and coordination of care plan   Heath Lark, MD 11/26/2022 10:08 AM  INTERVAL HISTORY: Please see below for problem oriented charting. he returns for treatment follow-up with his wife He tolerated treatment well No recent infections We discussed future follow-up and plan of care and review side effects of treatment  REVIEW OF SYSTEMS:   Constitutional: Denies fevers, chills or abnormal weight loss Eyes: Denies blurriness of vision Ears, nose, mouth, throat, and face: Denies mucositis or sore  throat Respiratory: Denies cough, dyspnea or wheezes Cardiovascular: Denies palpitation, chest discomfort or lower extremity swelling Gastrointestinal:  Denies nausea, heartburn or change in bowel habits Skin: Denies abnormal skin rashes Lymphatics: Denies new lymphadenopathy or easy bruising Neurological:Denies numbness, tingling or new weaknesses Behavioral/Psych: Mood is stable, no new changes  All other systems were reviewed with the patient and are negative.  I have reviewed the past medical history, past surgical history, social history and family history with the patient and they are unchanged from previous note.  ALLERGIES:  is allergic to nitrofurantoin, penicillins, sulfonamide derivatives, amlodipine, shellfish allergy, and tamsulosin.  MEDICATIONS:  Current Outpatient Medications  Medication Sig Dispense Refill   amLODipine (NORVASC) 5 MG tablet Take 1 tablet (5 mg total) by mouth daily. 90 tablet 3   aspirin EC 81 MG tablet Take 81 mg by mouth daily. Swallow whole.     famotidine (PEPCID) 20 MG tablet TAKE 1 TABLET BY MOUTH AT  NIGHT 90 tablet 3   fexofenadine (ALLEGRA) 180 MG tablet Take 180 mg by mouth daily as needed (allergies).     fluticasone (FLONASE) 50 MCG/ACT nasal spray Place 2 sprays into the nose 2 (two) times daily as needed for rhinitis or allergies.     Hydrocortisone-Iodoquinol 1-1 % CREA Apply 1 application topically daily.     hydroxyurea (HYDREA) 500 MG capsule TAKE 2 CAPSULES (1,000 MG TOTAL) BY MOUTH DAILY. MAY TAKE WITH FOOD TO MINIMIZE GI SIDE EFFECTS. 180 capsule 1   Multiple Vitamins-Minerals (PRESERVISION AREDS PO) Take 1 tablet by mouth daily.     sertraline (ZOLOFT) 25 MG tablet Take 1 tablet (25 mg total) by mouth daily. 90 tablet  4   telmisartan-hydrochlorothiazide (MICARDIS HCT) 40-12.5 MG tablet Take 1 tablet by mouth daily. 90 tablet 3   No current facility-administered medications for this visit.    SUMMARY OF ONCOLOGIC HISTORY: Mark Copeland 89 y.o. male is here because of elevated WBC and platelet count  He was found to have abnormal CBC from recent blood draw.  I have the opportunity to review his CBC dated back to several years On 05/23/2020, his CBC was normal with white count of 8.7, hemoglobin 16.7 and platelet count of 351 On 06/05/2021, his CBC is not normal with white count of 9.2, hemoglobin of 15 and platelet count of 4 54 On June 07, 2022, white blood cell count is 11.5, hemoglobin 14.4 and platelet count 607 On 06/18/2022, white blood cell count is elevated at 12.7, hemoglobin 14.3 and platelet count of 592 On 07/17/2022, white count 12.6, hemoglobin 14.3 and platelet count 576 On August 13, 2022, molecular testing came back positive for  CAL-R On August 22, 2022, the patient is started on hydroxyurea, initially at 500 mg daily but subsequently increased to 1000 mg daily  PHYSICAL EXAMINATION: ECOG PERFORMANCE STATUS: 0 - Asymptomatic  Vitals:   11/26/22 0922  BP: (!) 149/69  Pulse: 63  Resp: 18  Temp: (!) 97.4 F (36.3 C)  SpO2: 100%   Filed Weights   11/26/22 0922  Weight: 170 lb (77.1 kg)    GENERAL:alert, no distress and comfortable NEURO: alert & oriented x 3 with fluent speech, no focal motor/sensory deficits  LABORATORY DATA:  I have reviewed the data as listed    Component Value Date/Time   NA 139 06/07/2022 0847   K 4.0 06/07/2022 0847   CL 103 06/07/2022 0847   CO2 27 06/07/2022 0847   GLUCOSE 102 (H) 06/07/2022 0847   BUN 22 06/07/2022 0847   CREATININE 1.07 06/07/2022 0847   CREATININE 1.00 05/23/2020 0819   CALCIUM 9.8 06/07/2022 0847   PROT 6.9 06/07/2022 0847   ALBUMIN 4.7 06/07/2022 0847   AST 19 06/07/2022 0847   ALT 14 06/07/2022 0847   ALKPHOS 57 06/07/2022 0847   BILITOT 0.8 06/07/2022 0847   GFRNONAA >60 06/28/2018 0345   GFRAA >60 06/28/2018 0345    No results found for: "SPEP", "UPEP"  Lab Results  Component Value Date   WBC 6.6 11/26/2022   NEUTROABS  3.3 11/26/2022   HGB 13.8 11/26/2022   HCT 39.3 11/26/2022   MCV 100.3 (H) 11/26/2022   PLT 244 11/26/2022      Chemistry      Component Value Date/Time   NA 139 06/07/2022 0847   K 4.0 06/07/2022 0847   CL 103 06/07/2022 0847   CO2 27 06/07/2022 0847   BUN 22 06/07/2022 0847   CREATININE 1.07 06/07/2022 0847   CREATININE 1.00 05/23/2020 0819      Component Value Date/Time   CALCIUM 9.8 06/07/2022 0847   ALKPHOS 57 06/07/2022 0847   AST 19 06/07/2022 0847   ALT 14 06/07/2022 0847   BILITOT 0.8 06/07/2022 0847

## 2022-11-26 NOTE — Assessment & Plan Note (Signed)
He has excellent response to treatment with near normalization of his CBC  He will continue current dose of hydroxyurea at 1000 mg daily along with aspirin I plan to see him again in 3 months for further follow-up We discussed risk of infection while on treatment and I think it is a wise decision that he cancel his planned vacation I will be willing to help him complete paperwork so that he can get refund of his paid furcation We discussed situations that could cause excessive bone marrow suppression such as severe infection or surgery and in those circumstances, the patient has my permission to hold hydroxyurea and to call us after that

## 2022-12-08 ENCOUNTER — Encounter: Payer: Self-pay | Admitting: Internal Medicine

## 2022-12-08 NOTE — Progress Notes (Unsigned)
    Subjective:    Patient ID: Mark Copeland, male    DOB: 04/22/1934, 89 y.o.   MRN: [id number]      HPI Mark Copeland is here for No chief complaint on file.    SOB - occasionally -     Medications and allergies reviewed with patient and updated if appropriate.  Current Outpatient Medications on File Prior to Visit  Medication Sig Dispense Refill   amLODipine (NORVASC) 5 MG tablet Take 1 tablet (5 mg total) by mouth daily. 90 tablet 3   aspirin EC 81 MG tablet Take 81 mg by mouth daily. Swallow whole.     famotidine (PEPCID) 20 MG tablet TAKE 1 TABLET BY MOUTH AT  NIGHT 90 tablet 3   fexofenadine (ALLEGRA) 180 MG tablet Take 180 mg by mouth daily as needed (allergies).     fluticasone (FLONASE) 50 MCG/ACT nasal spray Place 2 sprays into the nose 2 (two) times daily as needed for rhinitis or allergies.     Hydrocortisone-Iodoquinol 1-1 % CREA Apply 1 application topically daily.     hydroxyurea (HYDREA) 500 MG capsule TAKE 2 CAPSULES (1,000 MG TOTAL) BY MOUTH DAILY. MAY TAKE WITH FOOD TO MINIMIZE GI SIDE EFFECTS. 180 capsule 1   Multiple Vitamins-Minerals (PRESERVISION AREDS PO) Take 1 tablet by mouth daily.     sertraline (ZOLOFT) 25 MG tablet Take 1 tablet (25 mg total) by mouth daily. 90 tablet 4   telmisartan-hydrochlorothiazide (MICARDIS HCT) 40-12.5 MG tablet Take 1 tablet by mouth daily. 90 tablet 3   No current facility-administered medications on file prior to visit.    Review of Systems     Objective:  There were no vitals filed for this visit. BP Readings from Last 3 Encounters:  11/26/22 (!) 149/69  10/07/22 139/72  09/16/22 131/60   Wt Readings from Last 3 Encounters:  11/26/22 170 lb (77.1 kg)  10/07/22 165 lb (74.8 kg)  09/16/22 172 lb (78 kg)   There is no height or weight on file to calculate BMI.    Physical Exam         Assessment & Plan:    See Problem List for Assessment and Plan of chronic medical problems.

## 2022-12-10 ENCOUNTER — Ambulatory Visit (INDEPENDENT_AMBULATORY_CARE_PROVIDER_SITE_OTHER): Payer: Medicare Other | Admitting: Internal Medicine

## 2022-12-10 VITALS — BP 140/70 | HR 53 | Temp 97.9°F | Ht 70.0 in | Wt 168.0 lb

## 2022-12-10 DIAGNOSIS — F32A Depression, unspecified: Secondary | ICD-10-CM

## 2022-12-10 DIAGNOSIS — R0600 Dyspnea, unspecified: Secondary | ICD-10-CM | POA: Diagnosis not present

## 2022-12-10 DIAGNOSIS — R7303 Prediabetes: Secondary | ICD-10-CM

## 2022-12-10 DIAGNOSIS — I10 Essential (primary) hypertension: Secondary | ICD-10-CM

## 2022-12-10 DIAGNOSIS — H8110 Benign paroxysmal vertigo, unspecified ear: Secondary | ICD-10-CM | POA: Diagnosis not present

## 2022-12-10 DIAGNOSIS — F419 Anxiety disorder, unspecified: Secondary | ICD-10-CM

## 2022-12-10 LAB — COMPREHENSIVE METABOLIC PANEL
ALT: 12 U/L (ref 0–53)
AST: 16 U/L (ref 0–37)
Albumin: 4.6 g/dL (ref 3.5–5.2)
Alkaline Phosphatase: 52 U/L (ref 39–117)
BUN: 22 mg/dL (ref 6–23)
CO2: 29 mEq/L (ref 19–32)
Calcium: 9.7 mg/dL (ref 8.4–10.5)
Chloride: 101 mEq/L (ref 96–112)
Creatinine, Ser: 1.07 mg/dL (ref 0.40–1.50)
GFR: 62.8 mL/min (ref >60.00)
Glucose, Bld: 112 mg/dL — ABNORMAL HIGH (ref 70–99)
Potassium: 4.3 mEq/L (ref 3.5–5.1)
Sodium: 137 mEq/L (ref 135–145)
Total Bilirubin: 0.8 mg/dL (ref 0.2–1.2)
Total Protein: 6.9 g/dL (ref 6.0–8.3)

## 2022-12-10 LAB — HEMOGLOBIN A1C: Hgb A1c MFr Bld: 5.7 % (ref 4.6–6.5)

## 2022-12-10 LAB — TSH: TSH: 0.97 u[IU]/mL (ref 0.35–5.50)

## 2022-12-10 MED ORDER — SERTRALINE HCL 50 MG PO TABS: 50.0000 mg | ORAL_TABLET | Freq: Every day | ORAL | 5 refills | Status: DC

## 2022-12-10 NOTE — Assessment & Plan Note (Signed)
He is experiencing episodic feeling of being off balance, needing to hang onto something-feels lightheaded kind of like he is on a boat, but denies true spinning ?  BPPV variant Sometimes this occurs when he goes from laying to sitting up quickly-it is not consistent No other neurological deficit and his balance is okay in between these episodes Will refer for vestibular PT to see if that helps If no improvement will consider imaging

## 2022-12-10 NOTE — Patient Instructions (Addendum)
    An EKG was done.  It is normal.      Blood work was ordered.   The lab is on the first floor.    Medications changes include :   increase sertraline to 50 mg daily    A referral was ordered for physical therapy.     Someone will call you to schedule an appointment.    Return if symptoms worsen or fail to improve.

## 2022-12-10 NOTE — Assessment & Plan Note (Signed)
Acute States shortness of breath Shortness of breath is really occurring when he feels anxious and is likely directly related to that EKG today shows sinus bradycardia at 51 bpm, otherwise normal EKG.  Sinus bradycardia is new since his last EKG in 2020, but otherwise there are no changes.  I do not think the bradycardia is causing any symptoms and the shortness of breath is clearly related to his anxiety which we will address

## 2022-12-10 NOTE — Assessment & Plan Note (Signed)
Chronic Blood pressure adequately controlled for his age Continue amlodipine 5 mg daily, telmisartan-HCTZ 40-12.5 mg daily CMP

## 2022-12-10 NOTE — Assessment & Plan Note (Signed)
Chronic Has been well-controlled He is experiencing increased nocturia which I do not think is related to his sugars, but given all of his symptoms right now we will check A1c

## 2022-12-10 NOTE — Assessment & Plan Note (Signed)
Chronic Has low level of anxiety and depression Has a lot of stress right now between family issues and getting ready to move to a retirement community at [employer] Currently taking sertraline 25 mg daily I do not think his anxiety and depression are ideally controlled Increase sertraline to 50 mg daily Continue regular exercise

## 2022-12-23 ENCOUNTER — Ambulatory Visit: Payer: Medicare Other

## 2022-12-24 ENCOUNTER — Ambulatory Visit: Payer: Medicare Other | Attending: Internal Medicine

## 2022-12-24 ENCOUNTER — Other Ambulatory Visit: Payer: Self-pay

## 2022-12-24 DIAGNOSIS — H8110 Benign paroxysmal vertigo, unspecified ear: Secondary | ICD-10-CM | POA: Diagnosis not present

## 2022-12-24 DIAGNOSIS — R42 Dizziness and giddiness: Secondary | ICD-10-CM | POA: Insufficient documentation
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
## 2023-01-02 ENCOUNTER — Other Ambulatory Visit: Payer: Self-pay | Admitting: Internal Medicine

## 2023-02-03 DIAGNOSIS — M25562 Pain in left knee: Secondary | ICD-10-CM | POA: Diagnosis not present

## 2023-02-04 DIAGNOSIS — M25562 Pain in left knee: Secondary | ICD-10-CM | POA: Diagnosis not present

## 2023-02-11 ENCOUNTER — Telehealth: Payer: Medicare Other

## 2023-02-18 ENCOUNTER — Inpatient Hospital Stay: Payer: Medicare Other | Attending: Hematology and Oncology

## 2023-02-18 ENCOUNTER — Encounter: Payer: Self-pay | Admitting: Hematology and Oncology

## 2023-02-18 ENCOUNTER — Inpatient Hospital Stay: Payer: Medicare Other | Admitting: Hematology and Oncology

## 2023-02-18 ENCOUNTER — Other Ambulatory Visit: Payer: Self-pay

## 2023-02-18 VITALS — BP 137/56 | HR 58 | Temp 97.4°F | Resp 18 | Ht 70.0 in | Wt 166.4 lb

## 2023-02-18 DIAGNOSIS — D471 Chronic myeloproliferative disease: Secondary | ICD-10-CM

## 2023-02-18 LAB — CBC WITH DIFFERENTIAL/PLATELET
Abs Immature Granulocytes: 0.01 10*3/uL (ref 0.00–0.07)
Basophils Absolute: 0 10*3/uL (ref 0.0–0.1)
Basophils Relative: 1 %
Eosinophils Absolute: 0.2 10*3/uL (ref 0.0–0.5)
Eosinophils Relative: 4 %
HCT: 36 % — ABNORMAL LOW (ref 39.0–52.0)
Hemoglobin: 12.8 g/dL — ABNORMAL LOW (ref 13.0–17.0)
Immature Granulocytes: 0 %
Lymphocytes Relative: 42 %
Lymphs Abs: 2.2 10*3/uL (ref 0.7–4.0)
MCH: 40.5 pg — ABNORMAL HIGH (ref 26.0–34.0)
MCHC: 35.6 g/dL (ref 30.0–36.0)
MCV: 113.9 fL — ABNORMAL HIGH (ref 80.0–100.0)
Monocytes Absolute: 0.6 10*3/uL (ref 0.1–1.0)
Monocytes Relative: 11 %
Neutro Abs: 2.2 10*3/uL (ref 1.7–7.7)
Neutrophils Relative %: 42 %
Platelets: 193 10*3/uL (ref 150–400)
RBC: 3.16 MIL/uL — ABNORMAL LOW (ref 4.22–5.81)
RDW: 13.6 % (ref 11.5–15.5)
WBC: 5.2 10*3/uL (ref 4.0–10.5)
nRBC: 0 % (ref 0.0–0.2)

## 2023-02-18 MED ORDER — HYDROXYUREA 500 MG PO CAPS: ORAL_CAPSULE | ORAL | 1 refills | Status: DC

## 2023-02-18 NOTE — Assessment & Plan Note (Signed)
We reviewed recent test results He is slightly anemic compared to prior blood work I recommend reducing hydroxyurea to 11 capsules/week Written instructions given to the patient I will see him again in 3 months for further follow-up

## 2023-02-18 NOTE — Progress Notes (Signed)
[facility] OFFICE PROGRESS NOTE  Patient Care Team: Pincus Sanes, MD as PCP - General (Internal Medicine) O'Neal, Ronnald Ramp, MD as PCP - Cardiology (Cardiology) Dorena Cookey, MD (Inactive) as Consulting Physician (Gastroenterology) Kathyrn Sheriff, [facility] as Pharmacist (Pharmacist) Sallye Lat, MD as Consulting Physician (Ophthalmology)  ASSESSMENT & PLAN:  Myeloproliferative neoplasm [facility]) We reviewed recent test results He is slightly anemic compared to prior blood work I recommend reducing hydroxyurea to 11 capsules/week Written instructions given to the patient I will see him again in 3 months for further follow-up  No orders of the defined types were placed in this encounter.   All questions were answered. The patient knows to call the clinic with any problems, questions or concerns. The total time spent in the appointment was 20 minutes encounter with patients including review of chart and various tests results, discussions about plan of care and coordination of care plan   [name], MD 02/18/2023 9:32 AM  INTERVAL HISTORY: Please see below for problem oriented charting. he returns for treatment follow-up with his wife He felt great He had recent tooth infection and COVID but recovered nicely He tolerated treatment well  REVIEW OF SYSTEMS:   Constitutional: Denies fevers, chills or abnormal weight loss Eyes: Denies blurriness of vision Ears, nose, mouth, throat, and face: Denies mucositis or sore throat Respiratory: Denies cough, dyspnea or wheezes Cardiovascular: Denies palpitation, chest discomfort or lower extremity swelling Gastrointestinal:  Denies nausea, heartburn or change in bowel habits Skin: Denies abnormal skin rashes Lymphatics: Denies new lymphadenopathy or easy bruising Neurological:Denies numbness, tingling or new weaknesses Behavioral/Psych: Mood is stable, no new changes  All other systems were reviewed with the patient  and are negative.  I have reviewed the past medical history, past surgical history, social history and family history with the patient and they are unchanged from previous note.  ALLERGIES:  is allergic to nitrofurantoin, penicillins, sulfonamide derivatives, amlodipine, shellfish allergy, and tamsulosin.  MEDICATIONS:  Current Outpatient Medications  Medication Sig Dispense Refill   amLODipine (NORVASC) 5 MG tablet Take 1 tablet (5 mg total) by mouth daily. 90 tablet 3   aspirin EC 81 MG tablet Take 81 mg by mouth daily. Swallow whole.     famotidine (PEPCID) 20 MG tablet TAKE 1 TABLET BY MOUTH AT  NIGHT 90 tablet 3   fexofenadine (ALLEGRA) 180 MG tablet Take 180 mg by mouth daily as needed (allergies).     fluticasone (FLONASE) 50 MCG/ACT nasal spray Place 2 sprays into the nose 2 (two) times daily as needed for rhinitis or allergies.     Hydrocortisone-Iodoquinol 1-1 % CREA Apply 1 application topically daily.     hydroxyurea (HYDREA) 500 MG capsule Take 1 capsule (500mg ) on Mondays, Wednesdays and Fridays; take 2 capsules (1000mg ) the rest of the week 180 capsule 1   Multiple Vitamins-Minerals (PRESERVISION AREDS PO) Take 1 tablet by mouth daily.     sertraline (ZOLOFT) 50 MG tablet Take 1 tablet (50 mg total) by mouth daily. 30 tablet 5   telmisartan-hydrochlorothiazide (MICARDIS HCT) 40-12.5 MG tablet Take 1 tablet by mouth daily. 90 tablet 3   No current facility-administered medications for this visit.    SUMMARY OF ONCOLOGIC HISTORY:  Mark Copeland 89 y.o. male is here because of elevated WBC and platelet count  He was found to have abnormal CBC from recent blood draw.  I have the opportunity to review his CBC dated back to several years On 05/23/2020, his CBC  was normal with white count of 8.7, hemoglobin 16.7 and platelet count of 351 On 06/05/2021, his CBC is not normal with white count of 9.2, hemoglobin of 15 and platelet count of 4 54 On June 07, 2022, white blood cell  count is 11.5, hemoglobin 14.4 and platelet count 607 On 06/18/2022, white blood cell count is elevated at 12.7, hemoglobin 14.3 and platelet count of 592 On 07/17/2022, white count 12.6, hemoglobin 14.3 and platelet count 576 On August 13, 2022, molecular testing came back positive for  CAL-R On August 22, 2022, the patient is started on hydroxyurea, initially at 500 mg daily but subsequently increased to 1000 mg daily On February 18, 2023, the dose of hydroxyurea is reduced.  He will take 500 mg on Mondays, Wednesdays and Fridays and the rest of the week 1000 mg  PHYSICAL EXAMINATION: ECOG PERFORMANCE STATUS: 0 - Asymptomatic  Vitals:   02/18/23 0914  BP: (!) 137/56  Pulse: (!) 58  Resp: 18  Temp: (!) 97.4 F (36.3 C)  SpO2: 100%   Filed Weights   02/18/23 0914  Weight: 166 lb 6.4 oz (75.5 kg)    GENERAL:alert, no distress and comfortable  NEURO: alert & oriented x 3 with fluent speech, no focal motor/sensory deficits  LABORATORY DATA:  I have reviewed the data as listed    Component Value Date/Time   NA 137 12/10/2022 0849   K 4.3 12/10/2022 0849   CL 101 12/10/2022 0849   CO2 29 12/10/2022 0849   GLUCOSE 112 (H) 12/10/2022 0849   BUN 22 12/10/2022 0849   CREATININE 1.07 12/10/2022 0849   CREATININE 1.00 05/23/2020 0819   CALCIUM 9.7 12/10/2022 0849   PROT 6.9 12/10/2022 0849   ALBUMIN 4.6 12/10/2022 0849   AST 16 12/10/2022 0849   ALT 12 12/10/2022 0849   ALKPHOS 52 12/10/2022 0849   BILITOT 0.8 12/10/2022 0849   GFRNONAA >60 06/28/2018 0345   GFRAA >60 06/28/2018 0345    No results found for: "SPEP", "UPEP"  Lab Results  Component Value Date   WBC 5.2 02/18/2023   NEUTROABS 2.2 02/18/2023   HGB 12.8 (L) 02/18/2023   HCT 36.0 (L) 02/18/2023   MCV 113.9 (H) 02/18/2023   PLT 193 02/18/2023      Chemistry      Component Value Date/Time   NA 137 12/10/2022 0849   K 4.3 12/10/2022 0849   CL 101 12/10/2022 0849   CO2 29 12/10/2022 0849   BUN 22 12/10/2022  0849   CREATININE 1.07 12/10/2022 0849   CREATININE 1.00 05/23/2020 0819      Component Value Date/Time   CALCIUM 9.7 12/10/2022 0849   ALKPHOS 52 12/10/2022 0849   AST 16 12/10/2022 0849   ALT 12 12/10/2022 0849   BILITOT 0.8 12/10/2022 0849

## 2023-04-08 ENCOUNTER — Other Ambulatory Visit: Payer: Self-pay

## 2023-04-08 ENCOUNTER — Telehealth: Payer: Self-pay

## 2023-04-08 MED ORDER — HYDROXYUREA 500 MG PO CAPS: ORAL_CAPSULE | ORAL | 1 refills | Status: DC

## 2023-04-08 NOTE — Telephone Encounter (Signed)
Wife called and left a message requesting refill of Hydrea to mail order pharmacy, to [facility] pharmacy. Rx sent as requested.

## 2023-04-11 ENCOUNTER — Other Ambulatory Visit: Payer: Self-pay

## 2023-04-11 ENCOUNTER — Other Ambulatory Visit: Payer: Medicare Other

## 2023-04-11 ENCOUNTER — Ambulatory Visit (INDEPENDENT_AMBULATORY_CARE_PROVIDER_SITE_OTHER): Payer: Medicare Other | Admitting: *Deleted

## 2023-04-11 VITALS — BP 122/72 | Temp 98.2°F | Ht 70.0 in | Wt 166.0 lb

## 2023-04-11 DIAGNOSIS — Z111 Encounter for screening for respiratory tuberculosis: Secondary | ICD-10-CM

## 2023-04-11 DIAGNOSIS — Z0000 Encounter for general adult medical examination without abnormal findings: Secondary | ICD-10-CM

## 2023-04-11 NOTE — Patient Instructions (Signed)
Health Maintenance, Male Adopting a healthy lifestyle and getting preventive care are important in promoting health and wellness. Ask your health care provider about: The right schedule for you to have regular tests and exams. Things you can do on your own to prevent diseases and keep yourself healthy. What should I know about diet, weight, and exercise? Eat a healthy diet  Eat a diet that includes plenty of vegetables, fruits, low-fat dairy products, and lean protein. Do not eat a lot of foods that are high in solid fats, added sugars, or sodium. Maintain a healthy weight Body mass index (BMI) is a measurement that can be used to identify possible weight problems. It estimates body fat based on height and weight. Your health care provider can help determine your BMI and help you achieve or maintain a healthy weight. Get regular exercise Get regular exercise. This is one of the most important things you can do for your health. Most adults should: Exercise for at least 150 minutes each week. The exercise should increase your heart rate and make you sweat (moderate-intensity exercise). Do strengthening exercises at least twice a week. This is in addition to the moderate-intensity exercise. Spend less time sitting. Even light physical activity can be beneficial. Watch cholesterol and blood lipids Have your blood tested for lipids and cholesterol at 87 years of age, then have this test every 5 years. You may need to have your cholesterol levels checked more often if: Your lipid or cholesterol levels are high. You are older than 87 years of age. You are at high risk for heart disease. What should I know about cancer screening? Many types of cancers can be detected early and may often be prevented. Depending on your health history and family history, you may need to have cancer screening at various ages. This may include screening for: Colorectal cancer. Prostate cancer. Skin cancer. Lung  cancer. What should I know about heart disease, diabetes, and high blood pressure? Blood pressure and heart disease High blood pressure causes heart disease and increases the risk of stroke. This is more likely to develop in people who have high blood pressure readings or are overweight. Talk with your health care provider about your target blood pressure readings. Have your blood pressure checked: Every 3-5 years if you are 18-39 years of age. Every year if you are 40 years old or older. If you are between the ages of 65 and 75 and are a current or former smoker, ask your health care provider if you should have a one-time screening for abdominal aortic aneurysm (AAA). Diabetes Have regular diabetes screenings. This checks your fasting blood sugar level. Have the screening done: Once every three years after age 45 if you are at a normal weight and have a low risk for diabetes. More often and at a younger age if you are overweight or have a high risk for diabetes. What should I know about preventing infection? Hepatitis B If you have a higher risk for hepatitis B, you should be screened for this virus. Talk with your health care provider to find out if you are at risk for hepatitis B infection. Hepatitis C Blood testing is recommended for: Everyone born from 1945 through 1965. Anyone with known risk factors for hepatitis C. Sexually transmitted infections (STIs) You should be screened each year for STIs, including gonorrhea and chlamydia, if: You are sexually active and are younger than 87 years of age. You are older than 87 years of age and your   health care provider tells you that you are at risk for this type of infection. Your sexual activity has changed since you were last screened, and you are at increased risk for chlamydia or gonorrhea. Ask your health care provider if you are at risk. Ask your health care provider about whether you are at high risk for HIV. Your health care provider  may recommend a prescription medicine to help prevent HIV infection. If you choose to take medicine to prevent HIV, you should first get tested for HIV. You should then be tested every 3 months for as long as you are taking the medicine. Follow these instructions at home: Alcohol use Do not drink alcohol if your health care provider tells you not to drink. If you drink alcohol: Limit how much you have to 0-2 drinks a day. Know how much alcohol is in your drink. In the [place] one drink equals one 12 oz bottle of beer (355 mL), one 5 oz glass of wine (148 mL), or one 1 oz glass of hard liquor (44 mL). Lifestyle Do not use any products that contain nicotine or tobacco. These products include cigarettes, chewing tobacco, and vaping devices, such as e-cigarettes. If you need help quitting, ask your health care provider. Do not use street drugs. Do not share needles. Ask your health care provider for help if you need support or information about quitting drugs. General instructions Schedule regular health, dental, and eye exams. Stay current with your vaccines. Tell your health care provider if: You often feel depressed. You have ever been abused or do not feel safe at home. Summary Adopting a healthy lifestyle and getting preventive care are important in promoting health and wellness. Follow your health care provider's instructions about healthy diet, exercising, and getting tested or screened for diseases. Follow your health care provider's instructions on monitoring your cholesterol and blood pressure. This information is not intended to replace advice given to you by your health care provider. Make sure you discuss any questions you have with your health care provider. Document Revised: 03/19/2021 Document Reviewed: 03/19/2021 Elsevier Patient Education  2024 [employer]  

## 2023-04-11 NOTE — Progress Notes (Signed)
Subjective:   Mark Copeland is a 89 y.o. male who presents for Medicare Annual/Subsequent preventive examination.  Review of Systems    Defer to PCP Cardiac Risk Factors include: advanced age (>54men, >9 women);male gender     Objective:    Today's Vitals   04/11/23 0804  BP: 122/72  Temp: 98.2 F (36.8 C)  TempSrc: Oral  Weight: 166 lb (75.3 kg)  Height: 5\' 10"  (1.778 m)   Body mass index is 23.82 kg/m.     04/11/2023    8:09 AM 12/24/2022   10:18 AM 04/09/2022    1:05 PM 04/06/2021   11:25 AM 06/28/2018    3:19 AM 10/23/2011   11:00 PM  Advanced Directives  Does Patient Have a Medical Advance Directive? Yes Yes Yes Yes No Patient has advance directive, copy not in chart  Type of Advance Directive Living will;Healthcare Power of [employer] Power of Attorney Living will Living will;Healthcare Power of Attorney  Living will;Healthcare Power of Attorney  Does patient want to make changes to medical advance directive?   No - Patient declined No - Patient declined    Copy of Healthcare Power of Attorney in Chart?    No - copy requested      Current Medications (verified) Outpatient Encounter Medications as of 04/11/2023  Medication Sig   amLODipine (NORVASC) 5 MG tablet Take 1 tablet (5 mg total) by mouth daily.   aspirin EC 81 MG tablet Take 81 mg by mouth daily. Swallow whole.   famotidine (PEPCID) 20 MG tablet TAKE 1 TABLET BY MOUTH AT  NIGHT   fexofenadine (ALLEGRA) 180 MG tablet Take 180 mg by mouth daily as needed (allergies).   fluticasone (FLONASE) 50 MCG/ACT nasal spray Place 2 sprays into the nose 2 (two) times daily as needed for rhinitis or allergies.   Hydrocortisone-Iodoquinol 1-1 % CREA Apply 1 application topically daily.   hydroxyurea (HYDREA) 500 MG capsule Take 1 capsule (500mg ) on Mondays, Wednesdays and Fridays; take 2 capsules (1000mg ) the rest of the week   Multiple Vitamins-Minerals (PRESERVISION AREDS PO) Take 1 tablet by mouth daily.    sertraline (ZOLOFT) 50 MG tablet Take 1 tablet (50 mg total) by mouth daily.   telmisartan-hydrochlorothiazide (MICARDIS HCT) 40-12.5 MG tablet Take 1 tablet by mouth daily.   No facility-administered encounter medications on file as of 04/11/2023.    Allergies (verified) Nitrofurantoin, Penicillins, Sulfonamide derivatives, Amlodipine, Shellfish allergy, and Tamsulosin   History: Past Medical History:  Diagnosis Date   BPH (benign prostatic hypertrophy)    Colonic polyp 2011   Dr.John Hayes   Fasting hyperglycemia    Hiatal hernia 2012   Hyperlipidemia    Interstitial cystitis    presentation in 1997 w/ abdominal pain/distention, Dr. Eudelia Bunch, [facility]   Nephrolithiasis    x 2   Skin cancer    basal cell, Dr.Dan Yetta Barre   Past Surgical History:  Procedure Laterality Date   COLONOSCOPY W/ POLYPECTOMY     and esophageal dilation 01/2008 by Dr.Hayes; bladder stones s/p urethral dilation 10/2007 by Dr. Logan Bores   CYSTOSCOPY     2004/2011; Hospitalized 1997 w/ I.C.   LAPAROSCOPIC CHOLECYSTECTOMY  07/14/11   SEPTOPLASTY     TONSILLECTOMY     UPPER GASTROINTESTINAL ENDOSCOPY  2012   Dr Dorena Cookey; hiatal hernia   Family History  Problem Relation Age of Onset   Transient ischemic attack Father 69   Hypertension Father    Coronary artery disease Father  Stroke Mother 76   Hypertension Brother    Diabetes Neg Hx    Cancer Neg Hx    Social History   Socioeconomic History   Marital status: Married    Spouse name: Not on file   Number of children: 1   Years of education: Not on file   Highest education level: Not on file  Occupational History   Not on file  Tobacco Use   Smoking status: Former    Types: Cigarettes    Quit date: 08/04/1960    Years since quitting: 62.7   Smokeless tobacco: Never   Tobacco comments:    smoked 1958-1961, up to 2 cigars / day  Substance and Sexual Activity   Alcohol use: Not Currently    Comment:  occasional wine   Drug use: No   Sexual  activity: Not Currently  Other Topics Concern   Not on file  Social History Narrative   Retired Retail banker      Social Determinants of Health   Financial Resource Strain: Low Risk  (04/11/2023)   Overall Financial Resource Strain (CARDIA)    Difficulty of Paying Living Expenses: Not hard at all  Food Insecurity: No Food Insecurity (04/11/2023)   Hunger Vital Sign    Worried About Running Out of Food in the Last Year: Never true    Ran Out of Food in the Last Year: Never true  Transportation Needs: No Transportation Needs (04/11/2023)   PRAPARE - Administrator, Civil Service (Medical): No    Lack of Transportation (Non-Medical): No  Physical Activity: Sufficiently Active (04/11/2023)   Exercise Vital Sign    Days of Exercise per Week: 7 days    Minutes of Exercise per Session: 60 min  Stress: No Stress Concern Present (04/11/2023)   [employer] of Occupational Health - Occupational Stress Questionnaire    Feeling of Stress : Not at all  Social Connections: Moderately Integrated (04/11/2023)   Social Connection and Isolation Panel [NHANES]    Frequency of Communication with Friends and Family: Three times a week    Frequency of Social Gatherings with Friends and Family: Once a week    Attends Religious Services: More than 4 times per year    Active Member of [employer] or Organizations: No    Attends Banker Meetings: Never    Marital Status: Married    Tobacco Counseling Counseling given: Not Answered Tobacco comments: smoked 1958-1961, up to 2 cigars / day   Clinical Intake:  Pre-visit preparation completed: Yes  Pain : No/denies pain     Nutritional Status: BMI of 19-24  Normal Nutritional Risks: None Diabetes: No  How often do you need to have someone help you when you read instructions, pamphlets, or other written materials from your doctor or pharmacy?: 1 - Never What is the last grade level you completed in school?: 12th  grade  Diabetic?no  Interpreter Needed?: No      Activities of Daily Living    04/11/2023    8:31 AM 04/11/2023    8:15 AM  In your present state of health, do you have any difficulty performing the following activities:  Hearing?  0  Vision?  0  Comment  patient uses reading glasses  Difficulty concentrating or making decisions?  0  Walking or climbing stairs?  0  Dressing or bathing?  0  Doing errands, shopping?  0  Preparing Food and eating ? N   Using the Toilet? N  In the past six months, have you accidently leaked urine? N   Do you have problems with loss of bowel control? N   Managing your Medications? N   Managing your Finances? N   Housekeeping or managing your Housekeeping? N     Patient Care Team: Pincus Sanes, MD as PCP - General (Internal Medicine) O'Neal, Ronnald Ramp, MD as PCP - Cardiology (Cardiology) Dorena Cookey, MD (Inactive) as Consulting Physician (Gastroenterology) Kathyrn Sheriff, [facility] as Pharmacist (Pharmacist) Sallye Lat, MD as Consulting Physician (Ophthalmology)  Indicate any recent Medical Services you may have received from other than [facility] providers in the past year (date may be approximate).     Assessment:   This is a routine wellness examination for Mark Copeland.  Hearing/Vision screen Vision Screening - Comments:: Annual eye exam  Dietary issues and exercise activities discussed: Current Exercise Habits: Home exercise routine, Type of exercise: walking, Time (Minutes): > 60, Frequency (Times/Week): 7, Weekly Exercise (Minutes/Week): 0, Intensity: Moderate   Goals Addressed   None   Depression Screen    04/11/2023    8:13 AM 04/11/2023    8:09 AM 12/10/2022    8:25 AM 06/07/2022    8:16 AM 04/09/2022    1:18 PM 04/06/2021   11:24 AM 05/31/2020    3:15 PM  PHQ 2/9 Scores  PHQ - 2 Score 0 0 0 0 0 0 0  PHQ- 9 Score   0 0   0    Fall Risk    04/11/2023    8:08 AM 04/09/2023    4:20 PM 12/10/2022    8:24 AM 06/07/2022     8:16 AM 04/09/2022    1:10 PM  Fall Risk   Falls in the past year? 1  0 0 0  Number falls in past yr: 0 0 0 0 0  Injury with Fall? 1 1 0 0 0  Risk for fall due to : Impaired balance/gait  No Fall Risks No Fall Risks No Fall Risks  Follow up Falls evaluation completed  Falls evaluation completed Falls evaluation completed Falls evaluation completed    FALL RISK PREVENTION PERTAINING TO THE HOME:  Any stairs in or around the home? Yes  If so, are there any without handrails? Yes  Home free of loose throw rugs in walkways, pet beds, electrical cords, etc? yes  Adequate lighting in your home to reduce risk of falls? Yes   ASSISTIVE DEVICES UTILIZED TO PREVENT FALLS:  Life alert? Yes  Use of a cane, walker or w/c? No  Grab bars in the bathroom? Yes  Shower chair or bench in shower? No  Elevated toilet seat or a handicapped toilet? Yes   TIMED UP AND GO:  Was the test performed? Yes .  Length of time to ambulate 10 feet: 6 sec.   Gait steady and fast without use of assistive device  Cognitive Function:        04/11/2023    8:31 AM 04/09/2022    1:18 PM  6CIT Screen  What Year? 0 points 0 points  What month? 0 points 0 points  What time? 0 points 0 points  Count back from 20  0 points  Months in reverse 0 points 0 points  Repeat phrase 0 points 0 points  Total Score  0 points    Immunizations Immunization History  Administered Date(s) Administered   Fluad Quad(high Dose 65+) 08/07/2019, 08/11/2020, 08/13/2021, 07/30/2022   Influenza Whole 08/04/2008   Influenza, High Dose Seasonal  PF 08/08/2016, 07/09/2017, 08/13/2018   Influenza,inj,Quad PF,6+ Mos 08/03/2015   Influenza-Unspecified 07/28/2012   PFIZER(Purple Top)SARS-COV-2 Vaccination 11/22/2019, 12/12/2019, 08/29/2020, 02/13/2021, 07/30/2021   Pneumococcal Conjugate-13 04/04/2015   Pneumococcal Polysaccharide-23 09/16/2002   Td 07/22/2000, 10/11/2010   Tdap 05/10/2021   Zoster Recombinat (Shingrix) 02/20/2018,  05/15/2018   Zoster, Live 09/24/2007    TDAP status: Up to date  Flu Vaccine status: Up to date  Pneumococcal vaccine status: Up to date  Covid-19 vaccine status: Completed vaccines  Qualifies for Shingles Vaccine? Yes   Zostavax completed Yes   Shingrix Completed?: Yes  Screening Tests Health Maintenance  Topic Date Due   COVID-19 Vaccine (6 - 2023-24 season) 07/12/2022   INFLUENZA VACCINE  06/12/2023   Medicare Annual Wellness (AWV)  04/10/2024   DTaP/Tdap/Td (4 - Td or Tdap) 05/11/2031   Pneumonia Vaccine 42+ Years old  Completed   Zoster Vaccines- Shingrix  Completed   HPV VACCINES  Aged Out    Health Maintenance  Health Maintenance Due  Topic Date Due   COVID-19 Vaccine (6 - 2023-24 season) 07/12/2022    Colorectal cancer screening: No longer required.   Lung Cancer Screening: (Low Dose CT Chest recommended if Age 28-80 years, 30 pack-year currently smoking OR have quit w/in 15years.) does not qualify.   Lung Cancer Screening Referral: n/a  Additional Screening:  Hepatitis C Screening: does qualify; Completed unknown  Vision Screening: Recommended annual ophthalmology exams for early detection of glaucoma and other disorders of the eye. Is the patient up to date with their annual eye exam?  Yes  Who is the provider or what is the name of the office in which the patient attends annual eye exams? Dr. Dione Booze If pt is not established with a provider, would they like to be referred to a provider to establish care? No .   Dental Screening: Recommended annual dental exams for proper oral hygiene  Community Resource Referral / Chronic Care Management: CRR required this visit?  No   CCM required this visit?  No      Plan:     I have personally reviewed and noted the following in the patient's chart:   Medical and social history Use of alcohol, tobacco or illicit drugs  Current medications and supplements including opioid prescriptions. Patient is not  currently taking opioid prescriptions. Functional ability and status Nutritional status Physical activity Advanced directives List of other physicians Hospitalizations, surgeries, and ER visits in previous 12 months Vitals Screenings to include cognitive, depression, and falls Referrals and appointments  In addition, I have reviewed and discussed with patient certain preventive protocols, quality metrics, and best practice recommendations. A written personalized care plan for preventive services as well as general preventive health recommendations were provided to patient.     Tamela Oddi, CMA   04/11/2023   Nurse Notes:  Mark Copeland , Thank you for taking time to come for your Medicare Wellness Visit. I appreciate your ongoing commitment to your health goals. Please review the following plan we discussed and let me know if I can assist you in the future.   These are the goals we discussed:  Goals      Manage My Medicine     Timeframe:  Long-Range Goal Priority:  Medium Start Date:   02/12/2022                          Expected End Date: 02/13/2023  Follow Up Date 02/2023   - call for medicine refill 2 or 3 days before it runs out - call if I am sick and can't take my medicine - keep a list of all the medicines I take; vitamins and herbals too - learn to read medicine labels    Why is this important?   These steps will help you keep on track with your medicines.   Notes:      Patient Stated     To maintain my current health status by continuing to eat healthy, stay physically active and socially active.        This is a list of the screening recommended for you and due dates:  Health Maintenance  Topic Date Due   COVID-19 Vaccine (6 - 2023-24 season) 07/12/2022   Flu Shot  06/12/2023   Medicare Annual Wellness Visit  04/10/2024   DTaP/Tdap/Td vaccine (4 - Td or Tdap) 05/11/2031   Pneumonia Vaccine  Completed   Zoster (Shingles) Vaccine   Completed   HPV Vaccine  Aged Out

## 2023-04-12 ENCOUNTER — Other Ambulatory Visit: Payer: Self-pay | Admitting: Hematology and Oncology

## 2023-04-13 LAB — QUANTIFERON-TB GOLD PLUS
Mitogen-NIL: >10 IU/mL
NIL: 0.03 IU/mL
QuantiFERON-TB Gold Plus: NEGATIVE
TB1-NIL: 0.06 IU/mL
TB2-NIL: 0 IU/mL

## 2023-04-15 DIAGNOSIS — H2513 Age-related nuclear cataract, bilateral: Secondary | ICD-10-CM | POA: Diagnosis not present

## 2023-04-15 DIAGNOSIS — H353131 Nonexudative age-related macular degeneration, bilateral, early dry stage: Secondary | ICD-10-CM | POA: Diagnosis not present

## 2023-04-15 DIAGNOSIS — H43813 Vitreous degeneration, bilateral: Secondary | ICD-10-CM | POA: Diagnosis not present

## 2023-04-15 DIAGNOSIS — H0102B Squamous blepharitis left eye, upper and lower eyelids: Secondary | ICD-10-CM | POA: Diagnosis not present

## 2023-04-15 DIAGNOSIS — H0102A Squamous blepharitis right eye, upper and lower eyelids: Secondary | ICD-10-CM | POA: Diagnosis not present

## 2023-04-23 ENCOUNTER — Telehealth: Payer: Self-pay

## 2023-04-23 ENCOUNTER — Other Ambulatory Visit: Payer: Self-pay

## 2023-04-23 NOTE — Telephone Encounter (Signed)
Forms faxed today to [phone] and fax conformation received with 15 pages.

## 2023-05-05 ENCOUNTER — Other Ambulatory Visit: Payer: Self-pay | Admitting: Internal Medicine

## 2023-05-12 DIAGNOSIS — L570 Actinic keratosis: Secondary | ICD-10-CM | POA: Diagnosis not present

## 2023-05-20 ENCOUNTER — Encounter: Payer: Self-pay | Admitting: Hematology and Oncology

## 2023-05-20 ENCOUNTER — Inpatient Hospital Stay: Payer: Medicare Other | Admitting: Hematology and Oncology

## 2023-05-20 ENCOUNTER — Other Ambulatory Visit: Payer: Self-pay

## 2023-05-20 ENCOUNTER — Inpatient Hospital Stay: Payer: Medicare Other | Attending: Hematology and Oncology

## 2023-05-20 VITALS — BP 145/61 | HR 65 | Temp 97.9°F | Resp 18 | Ht 70.0 in | Wt 168.2 lb

## 2023-05-20 DIAGNOSIS — D471 Chronic myeloproliferative disease: Secondary | ICD-10-CM

## 2023-05-20 LAB — CBC WITH DIFFERENTIAL/PLATELET
Abs Immature Granulocytes: 0.04 10*3/uL (ref 0.00–0.07)
Basophils Absolute: 0.1 10*3/uL (ref 0.0–0.1)
Basophils Relative: 1 %
Eosinophils Absolute: 0.2 10*3/uL (ref 0.0–0.5)
Eosinophils Relative: 3 %
HCT: 38.5 % — ABNORMAL LOW (ref 39.0–52.0)
Hemoglobin: 13.2 g/dL (ref 13.0–17.0)
Immature Granulocytes: 1 %
Lymphocytes Relative: 34 %
Lymphs Abs: 2.5 10*3/uL (ref 0.7–4.0)
MCH: 39.8 pg — ABNORMAL HIGH (ref 26.0–34.0)
MCHC: 34.3 g/dL (ref 30.0–36.0)
MCV: 116 fL — ABNORMAL HIGH (ref 80.0–100.0)
Monocytes Absolute: 0.7 10*3/uL (ref 0.1–1.0)
Monocytes Relative: 10 %
Neutro Abs: 3.8 10*3/uL (ref 1.7–7.7)
Neutrophils Relative %: 51 %
Platelets: 240 10*3/uL (ref 150–400)
RBC: 3.32 MIL/uL — ABNORMAL LOW (ref 4.22–5.81)
RDW: 11.8 % (ref 11.5–15.5)
WBC: 7.4 10*3/uL (ref 4.0–10.5)
nRBC: 0 % (ref 0.0–0.2)

## 2023-05-20 NOTE — Progress Notes (Signed)
[facility] OFFICE PROGRESS NOTE  Patient Care Team: Pincus Sanes, MD as PCP - General (Internal Medicine) O'Neal, Ronnald Ramp, MD as PCP - Cardiology (Cardiology) Dorena Cookey, MD (Inactive) as Consulting Physician (Gastroenterology) Kathyrn Sheriff, [facility] (Inactive) as Pharmacist (Pharmacist) Sallye Lat, MD as Consulting Physician (Ophthalmology)  ASSESSMENT & PLAN:  Myeloproliferative neoplasm [facility]) We reviewed recent test results His CBC is normal He will continue current dose of hydroxyurea as directed I will see him again in 3 months for further follow-up We discussed certain circumstances that could cause bone marrow suppression such as infection, major distress and surgery and those can sometimes affect his blood count  No orders of the defined types were placed in this encounter.   All questions were answered. The patient knows to call the clinic with any problems, questions or concerns. The total time spent in the appointment was 20 minutes encounter with patients including review of chart and various tests results, discussions about plan of care and coordination of care plan   [name], MD 05/20/2023 9:51 AM  INTERVAL HISTORY: Please see below for problem oriented charting. he returns for treatment follow-up with his wife He is taking hydroxyurea as directed No recent infection  REVIEW OF SYSTEMS:   Constitutional: Denies fevers, chills or abnormal weight loss Eyes: Denies blurriness of vision Ears, nose, mouth, throat, and face: Denies mucositis or sore throat Respiratory: Denies cough, dyspnea or wheezes Cardiovascular: Denies palpitation, chest discomfort or lower extremity swelling Gastrointestinal:  Denies nausea, heartburn or change in bowel habits Skin: Denies abnormal skin rashes Lymphatics: Denies new lymphadenopathy or easy bruising Neurological:Denies numbness, tingling or new weaknesses Behavioral/Psych: Mood is stable, no  new changes  All other systems were reviewed with the patient and are negative.  I have reviewed the past medical history, past surgical history, social history and family history with the patient and they are unchanged from previous note.  ALLERGIES:  is allergic to nitrofurantoin, penicillins, sulfonamide derivatives, amlodipine, shellfish allergy, and tamsulosin.  MEDICATIONS:  Current Outpatient Medications  Medication Sig Dispense Refill   amLODipine (NORVASC) 5 MG tablet Take 1 tablet (5 mg total) by mouth daily. 90 tablet 3   aspirin EC 81 MG tablet Take 81 mg by mouth daily. Swallow whole.     famotidine (PEPCID) 20 MG tablet TAKE 1 TABLET BY MOUTH AT  NIGHT 90 tablet 3   fexofenadine (ALLEGRA) 180 MG tablet Take 180 mg by mouth daily as needed (allergies).     fluticasone (FLONASE) 50 MCG/ACT nasal spray Place 2 sprays into the nose 2 (two) times daily as needed for rhinitis or allergies.     Hydrocortisone-Iodoquinol 1-1 % CREA Apply 1 application topically daily.     hydroxyurea (HYDREA) 500 MG capsule Take 1 capsule (500mg ) on Mondays, Wednesdays and Fridays; take 2 capsules (1000mg ) the rest of the week 180 capsule 1   Multiple Vitamins-Minerals (PRESERVISION AREDS PO) Take 1 tablet by mouth daily.     sertraline (ZOLOFT) 50 MG tablet Take 1 tablet (50 mg total) by mouth daily. 30 tablet 5   telmisartan-hydrochlorothiazide (MICARDIS HCT) 40-12.5 MG tablet Take 1 tablet by mouth daily. Annual appt is due in July must see provider for future refills 90 tablet 0   No current facility-administered medications for this visit.    SUMMARY OF ONCOLOGIC HISTORY: He was found to have abnormal CBC from recent blood draw.  I have the opportunity to review his CBC dated back to several years  On 05/23/2020, his CBC was normal with white count of 8.7, hemoglobin 16.7 and platelet count of 351 On 06/05/2021, his CBC is not normal with white count of 9.2, hemoglobin of 15 and platelet count of 4  54 On June 07, 2022, white blood cell count is 11.5, hemoglobin 14.4 and platelet count 607 On 06/18/2022, white blood cell count is elevated at 12.7, hemoglobin 14.3 and platelet count of 592 On 07/17/2022, white count 12.6, hemoglobin 14.3 and platelet count 576 On August 13, 2022, molecular testing came back positive for  CAL-R On August 22, 2022, the patient is started on hydroxyurea, initially at 500 mg daily but subsequently increased to 1000 mg daily On February 18, 2023, the dose of hydroxyurea is reduced.  He will take 500 mg on Mondays, Wednesdays and Fridays and the rest of the week 1000 mg  PHYSICAL EXAMINATION: ECOG PERFORMANCE STATUS: 0 - Asymptomatic  Vitals:   05/20/23 0929  BP: (!) 145/61  Pulse: 65  Resp: 18  Temp: 97.9 F (36.6 C)  SpO2: 100%   Filed Weights   05/20/23 0929  Weight: 168 lb 3.2 oz (76.3 kg)    GENERAL:alert, no distress and comfortable NEURO: alert & oriented x 3 with fluent speech, no focal motor/sensory deficits  LABORATORY DATA:  I have reviewed the data as listed    Component Value Date/Time   NA 137 12/10/2022 0849   K 4.3 12/10/2022 0849   CL 101 12/10/2022 0849   CO2 29 12/10/2022 0849   GLUCOSE 112 (H) 12/10/2022 0849   BUN 22 12/10/2022 0849   CREATININE 1.07 12/10/2022 0849   CREATININE 1.00 05/23/2020 0819   CALCIUM 9.7 12/10/2022 0849   PROT 6.9 12/10/2022 0849   ALBUMIN 4.6 12/10/2022 0849   AST 16 12/10/2022 0849   ALT 12 12/10/2022 0849   ALKPHOS 52 12/10/2022 0849   BILITOT 0.8 12/10/2022 0849   GFRNONAA >60 06/28/2018 0345   GFRAA >60 06/28/2018 0345    No results found for: "SPEP", "UPEP"  Lab Results  Component Value Date   WBC 7.4 05/20/2023   NEUTROABS 3.8 05/20/2023   HGB 13.2 05/20/2023   HCT 38.5 (L) 05/20/2023   MCV 116.0 (H) 05/20/2023   PLT 240 05/20/2023      Chemistry      Component Value Date/Time   NA 137 12/10/2022 0849   K 4.3 12/10/2022 0849   CL 101 12/10/2022 0849   CO2 29 12/10/2022  0849   BUN 22 12/10/2022 0849   CREATININE 1.07 12/10/2022 0849   CREATININE 1.00 05/23/2020 0819      Component Value Date/Time   CALCIUM 9.7 12/10/2022 0849   ALKPHOS 52 12/10/2022 0849   AST 16 12/10/2022 0849   ALT 12 12/10/2022 0849   BILITOT 0.8 12/10/2022 0849

## 2023-05-20 NOTE — Assessment & Plan Note (Signed)
We reviewed recent test results His CBC is normal He will continue current dose of hydroxyurea as directed I will see him again in 3 months for further follow-up We discussed certain circumstances that could cause bone marrow suppression such as infection, major distress and surgery and those can sometimes affect his blood count

## 2023-06-03 ENCOUNTER — Other Ambulatory Visit: Payer: Self-pay | Admitting: Internal Medicine

## 2023-06-03 DIAGNOSIS — I10 Essential (primary) hypertension: Secondary | ICD-10-CM

## 2023-07-08 ENCOUNTER — Telehealth: Payer: Self-pay

## 2023-07-08 NOTE — Telephone Encounter (Signed)
Returned call to wife asking if Dr. Bertis Ruddy recommends covid vaccine. Left a message per Dr. Bertis Ruddy, she does not recommend the covid vaccine. She does recommend the flu vaccine. Ask for a call back to the office for questions.

## 2023-08-04 ENCOUNTER — Other Ambulatory Visit: Payer: Self-pay | Admitting: Internal Medicine

## 2023-08-11 ENCOUNTER — Other Ambulatory Visit: Payer: Self-pay | Admitting: Internal Medicine

## 2023-08-21 ENCOUNTER — Inpatient Hospital Stay: Payer: Medicare Other | Admitting: Hematology and Oncology

## 2023-08-21 ENCOUNTER — Inpatient Hospital Stay: Payer: Medicare Other | Attending: Hematology and Oncology

## 2023-08-21 ENCOUNTER — Encounter: Payer: Self-pay | Admitting: Hematology and Oncology

## 2023-08-21 VITALS — BP 147/67 | HR 64 | Temp 97.4°F | Resp 18 | Ht 70.0 in | Wt 173.2 lb

## 2023-08-21 DIAGNOSIS — D471 Chronic myeloproliferative disease: Secondary | ICD-10-CM | POA: Diagnosis present

## 2023-08-21 LAB — CBC WITH DIFFERENTIAL/PLATELET
Abs Immature Granulocytes: 0.06 10*3/uL (ref 0.00–0.07)
Basophils Absolute: 0.1 10*3/uL (ref 0.0–0.1)
Basophils Relative: 2 %
Eosinophils Absolute: 0.3 10*3/uL (ref 0.0–0.5)
Eosinophils Relative: 5 %
HCT: 39.6 % (ref 39.0–52.0)
Hemoglobin: 13.5 g/dL (ref 13.0–17.0)
Immature Granulocytes: 1 %
Lymphocytes Relative: 33 %
Lymphs Abs: 2.3 10*3/uL (ref 0.7–4.0)
MCH: 38.7 pg — ABNORMAL HIGH (ref 26.0–34.0)
MCHC: 34.1 g/dL (ref 30.0–36.0)
MCV: 113.5 fL — ABNORMAL HIGH (ref 80.0–100.0)
Monocytes Absolute: 0.7 10*3/uL (ref 0.1–1.0)
Monocytes Relative: 10 %
Neutro Abs: 3.5 10*3/uL (ref 1.7–7.7)
Neutrophils Relative %: 49 %
Platelets: 278 10*3/uL (ref 150–400)
RBC: 3.49 MIL/uL — ABNORMAL LOW (ref 4.22–5.81)
RDW: 13.2 % (ref 11.5–15.5)
WBC: 6.9 10*3/uL (ref 4.0–10.5)
nRBC: 0 % (ref 0.0–0.2)

## 2023-08-21 NOTE — Assessment & Plan Note (Signed)
We reviewed recent test results His CBC is normal He will continue current dose of hydroxyurea as directed I will see him again in 3 months for further follow-up We discussed certain circumstances that could cause bone marrow suppression such as infection, major distress and surgery and those can sometimes affect his blood count

## 2023-08-21 NOTE — Progress Notes (Signed)
[facility] OFFICE PROGRESS NOTE  Patient Care Team: Pincus Sanes, MD as PCP - General (Internal Medicine) O'Neal, Ronnald Ramp, MD as PCP - Cardiology (Cardiology) Dorena Cookey, MD (Inactive) as Consulting Physician (Gastroenterology) Kathyrn Sheriff, [facility] (Inactive) as Pharmacist (Pharmacist) Sallye Lat, MD as Consulting Physician (Ophthalmology)  ASSESSMENT & PLAN:  Myeloproliferative neoplasm [facility]) We reviewed recent test results His CBC is normal He will continue current dose of hydroxyurea as directed I will see him again in 3 months for further follow-up We discussed certain circumstances that could cause bone marrow suppression such as infection, major distress and surgery and those can sometimes affect his blood count  No orders of the defined types were placed in this encounter.   All questions were answered. The patient knows to call the clinic with any problems, questions or concerns. The total time spent in the appointment was 20 minutes encounter with patients including review of chart and various tests results, discussions about plan of care and coordination of care plan   [name], MD 08/21/2023 1:20 PM  INTERVAL HISTORY: Please see below for problem oriented charting. he returns for surveillance follow-up on hydroxyurea He denies missing doses No recent infection We discussed test results and future follow-up  REVIEW OF SYSTEMS:   Constitutional: Denies fevers, chills or abnormal weight loss Eyes: Denies blurriness of vision Ears, nose, mouth, throat, and face: Denies mucositis or sore throat Respiratory: Denies cough, dyspnea or wheezes Cardiovascular: Denies palpitation, chest discomfort or lower extremity swelling Gastrointestinal:  Denies nausea, heartburn or change in bowel habits Skin: Denies abnormal skin rashes Lymphatics: Denies new lymphadenopathy or easy bruising Neurological:Denies numbness, tingling or new  weaknesses Behavioral/Psych: Mood is stable, no new changes  All other systems were reviewed with the patient and are negative.  I have reviewed the past medical history, past surgical history, social history and family history with the patient and they are unchanged from previous note.  ALLERGIES:  is allergic to nitrofurantoin, penicillins, sulfonamide derivatives, amlodipine, shellfish allergy, and tamsulosin.  MEDICATIONS:  Current Outpatient Medications  Medication Sig Dispense Refill   amLODipine (NORVASC) 5 MG tablet Take 1 tablet (5 mg total) by mouth daily. Follow-up appt due in Oct must see provider for future refills 100 tablet 0   aspirin EC 81 MG tablet Take 81 mg by mouth daily. Swallow whole.     famotidine (PEPCID) 20 MG tablet TAKE 1 TABLET BY MOUTH AT  BEDTIME FOLLOW-UP APPOINTMENT  DUE IN OCT MUST SEE PROVIDER FOR FUTURE REFILLS 100 tablet 0   fexofenadine (ALLEGRA) 180 MG tablet Take 180 mg by mouth daily as needed (allergies).     fluticasone (FLONASE) 50 MCG/ACT nasal spray Place 2 sprays into the nose 2 (two) times daily as needed for rhinitis or allergies.     Hydrocortisone-Iodoquinol 1-1 % CREA Apply 1 application topically daily.     hydroxyurea (HYDREA) 500 MG capsule Take 1 capsule (500mg ) on Mondays, Wednesdays and Fridays; take 2 capsules (1000mg ) the rest of the week 180 capsule 1   Multiple Vitamins-Minerals (PRESERVISION AREDS PO) Take 1 tablet by mouth daily.     sertraline (ZOLOFT) 50 MG tablet Take 1 tablet (50 mg total) by mouth daily. 30 tablet 5   telmisartan-hydrochlorothiazide (MICARDIS HCT) 40-12.5 MG tablet TAKE 1 TABLET BY MOUTH DAILY 90 tablet 3   No current facility-administered medications for this visit.    SUMMARY OF ONCOLOGIC HISTORY: Oncology History   No history exists.  PHYSICAL EXAMINATION: ECOG PERFORMANCE STATUS: 0 - Asymptomatic  Vitals:   08/21/23 0917  BP: (!) 147/67  Pulse: 64  Resp: 18  Temp: (!) 97.4 F (36.3 C)   SpO2: 100%   Filed Weights   08/21/23 0917  Weight: 173 lb 3.2 oz (78.6 kg)    GENERAL:alert, no distress and comfortable SKIN: skin color, texture, turgor are normal, no rashes or significant lesions EYES: normal, Conjunctiva are pink and non-injected, sclera clear OROPHARYNX:no exudate, no erythema and lips, buccal mucosa, and tongue normal  NECK: supple, thyroid normal size, non-tender, without nodularity LYMPH:  no palpable lymphadenopathy in the cervical, axillary or inguinal LUNGS: clear to auscultation and percussion with normal breathing effort HEART: regular rate & rhythm and no murmurs and no lower extremity edema ABDOMEN:abdomen soft, non-tender and normal bowel sounds Musculoskeletal:no cyanosis of digits and no clubbing  NEURO: alert & oriented x 3 with fluent speech, no focal motor/sensory deficits  LABORATORY DATA:  I have reviewed the data as listed    Component Value Date/Time   NA 137 12/10/2022 0849   K 4.3 12/10/2022 0849   CL 101 12/10/2022 0849   CO2 29 12/10/2022 0849   GLUCOSE 112 (H) 12/10/2022 0849   BUN 22 12/10/2022 0849   CREATININE 1.07 12/10/2022 0849   CREATININE 1.00 05/23/2020 0819   CALCIUM 9.7 12/10/2022 0849   PROT 6.9 12/10/2022 0849   ALBUMIN 4.6 12/10/2022 0849   AST 16 12/10/2022 0849   ALT 12 12/10/2022 0849   ALKPHOS 52 12/10/2022 0849   BILITOT 0.8 12/10/2022 0849   GFRNONAA >60 06/28/2018 0345   GFRAA >60 06/28/2018 0345    No results found for: "SPEP", "UPEP"  Lab Results  Component Value Date   WBC 6.9 08/21/2023   NEUTROABS 3.5 08/21/2023   HGB 13.5 08/21/2023   HCT 39.6 08/21/2023   MCV 113.5 (H) 08/21/2023   PLT 278 08/21/2023      Chemistry      Component Value Date/Time   NA 137 12/10/2022 0849   K 4.3 12/10/2022 0849   CL 101 12/10/2022 0849   CO2 29 12/10/2022 0849   BUN 22 12/10/2022 0849   CREATININE 1.07 12/10/2022 0849   CREATININE 1.00 05/23/2020 0819      Component Value Date/Time   CALCIUM  9.7 12/10/2022 0849   ALKPHOS 52 12/10/2022 0849   AST 16 12/10/2022 0849   ALT 12 12/10/2022 0849   BILITOT 0.8 12/10/2022 0849

## 2023-09-03 DIAGNOSIS — L570 Actinic keratosis: Secondary | ICD-10-CM | POA: Diagnosis not present

## 2023-09-03 DIAGNOSIS — L821 Other seborrheic keratosis: Secondary | ICD-10-CM | POA: Diagnosis not present

## 2023-09-03 DIAGNOSIS — L905 Scar conditions and fibrosis of skin: Secondary | ICD-10-CM | POA: Diagnosis not present

## 2023-09-03 DIAGNOSIS — L218 Other seborrheic dermatitis: Secondary | ICD-10-CM | POA: Diagnosis not present

## 2023-09-03 DIAGNOSIS — Z85828 Personal history of other malignant neoplasm of skin: Secondary | ICD-10-CM | POA: Diagnosis not present

## 2023-09-22 ENCOUNTER — Other Ambulatory Visit: Payer: Self-pay | Admitting: Internal Medicine

## 2023-09-22 DIAGNOSIS — I10 Essential (primary) hypertension: Secondary | ICD-10-CM

## 2023-10-21 DIAGNOSIS — M25562 Pain in left knee: Secondary | ICD-10-CM | POA: Diagnosis not present

## 2023-11-06 ENCOUNTER — Other Ambulatory Visit: Payer: Self-pay | Admitting: *Deleted

## 2023-11-06 MED ORDER — HYDROXYUREA 500 MG PO CAPS: ORAL_CAPSULE | ORAL | 1 refills | Status: DC

## 2023-11-20 ENCOUNTER — Other Ambulatory Visit (INDEPENDENT_AMBULATORY_CARE_PROVIDER_SITE_OTHER): Payer: Medicare Other

## 2023-11-20 ENCOUNTER — Other Ambulatory Visit: Payer: Self-pay | Admitting: Internal Medicine

## 2023-11-20 ENCOUNTER — Telehealth: Payer: Self-pay

## 2023-11-20 DIAGNOSIS — R300 Dysuria: Secondary | ICD-10-CM

## 2023-11-20 LAB — URINALYSIS, ROUTINE W REFLEX MICROSCOPIC
Bilirubin Urine: NEGATIVE
Hgb urine dipstick: NEGATIVE
Ketones, ur: NEGATIVE
Leukocytes,Ua: NEGATIVE
Nitrite: NEGATIVE
Specific Gravity, Urine: 1.025 (ref 1.000–1.030)
Total Protein, Urine: NEGATIVE
Urine Glucose: NEGATIVE
Urobilinogen, UA: 0.2 (ref 0.0–1.0)
pH: 6 (ref 5.0–8.0)

## 2023-11-20 NOTE — Telephone Encounter (Signed)
 Called regarding aptps tomorrow. Wife would like to move to Monday. Appts moved and wife aware of appt times.

## 2023-11-21 ENCOUNTER — Inpatient Hospital Stay: Payer: Medicare Other | Admitting: Hematology and Oncology

## 2023-11-21 ENCOUNTER — Inpatient Hospital Stay: Payer: Medicare Other

## 2023-11-21 ENCOUNTER — Encounter: Payer: Self-pay | Admitting: Internal Medicine

## 2023-11-21 LAB — URINE CULTURE: Result:: NO GROWTH

## 2023-11-24 ENCOUNTER — Telehealth: Payer: Self-pay

## 2023-11-24 ENCOUNTER — Inpatient Hospital Stay: Payer: Medicare Other | Admitting: Hematology and Oncology

## 2023-11-24 ENCOUNTER — Inpatient Hospital Stay: Payer: Medicare Other | Attending: Hematology and Oncology

## 2023-11-24 ENCOUNTER — Encounter: Payer: Self-pay | Admitting: Hematology and Oncology

## 2023-11-24 VITALS — BP 141/61 | HR 67 | Temp 98.1°F | Resp 16 | Ht 70.0 in | Wt 174.9 lb

## 2023-11-24 DIAGNOSIS — D471 Chronic myeloproliferative disease: Secondary | ICD-10-CM | POA: Diagnosis present

## 2023-11-24 LAB — CBC WITH DIFFERENTIAL/PLATELET
Abs Immature Granulocytes: 0.1 10*3/uL — ABNORMAL HIGH (ref 0.00–0.07)
Basophils Absolute: 0.1 10*3/uL (ref 0.0–0.1)
Basophils Relative: 1 %
Eosinophils Absolute: 0.2 10*3/uL (ref 0.0–0.5)
Eosinophils Relative: 2 %
HCT: 38.1 % — ABNORMAL LOW (ref 39.0–52.0)
Hemoglobin: 13.3 g/dL (ref 13.0–17.0)
Immature Granulocytes: 1 %
Lymphocytes Relative: 26 %
Lymphs Abs: 2.2 10*3/uL (ref 0.7–4.0)
MCH: 38.7 pg — ABNORMAL HIGH (ref 26.0–34.0)
MCHC: 34.9 g/dL (ref 30.0–36.0)
MCV: 110.8 fL — ABNORMAL HIGH (ref 80.0–100.0)
Monocytes Absolute: 0.9 10*3/uL (ref 0.1–1.0)
Monocytes Relative: 11 %
Neutro Abs: 5.1 10*3/uL (ref 1.7–7.7)
Neutrophils Relative %: 59 %
Platelets: 262 10*3/uL (ref 150–400)
RBC: 3.44 MIL/uL — ABNORMAL LOW (ref 4.22–5.81)
RDW: 13.2 % (ref 11.5–15.5)
WBC: 8.6 10*3/uL (ref 4.0–10.5)
nRBC: 0 % (ref 0.0–0.2)

## 2023-11-24 NOTE — Assessment & Plan Note (Signed)
 We reviewed recent test results His CBC is normal He will continue current dose of hydroxyurea  as directed He has appointment to see his primary care doctor in the spring and I recommend he has his CBC rechecked then I will see him again in 6 months for further follow-up We discussed certain circumstances that could cause bone marrow suppression such as infection, major distress and surgery and those can sometimes affect his blood count

## 2023-11-24 NOTE — Telephone Encounter (Signed)
 Copied from CRM [id number]. Topic: General - Other >> Nov 24, 2023  9:58 AM Adele Barthel wrote: Reason for CRM: Patient's wife returning message, would like call back at #[phone] (M) regarding message sent on MyChart

## 2023-11-24 NOTE — Progress Notes (Signed)
 [facility] OFFICE PROGRESS NOTE  Patient Care Team: Geofm Glade PARAS, MD as PCP - General (Internal Medicine) O'Neal, Darryle Ned, MD as PCP - Cardiology (Cardiology) Dyane Rush, MD (Inactive) as Consulting Physician (Gastroenterology) Fate Morna SAILOR, [facility] (Inactive) as Pharmacist (Pharmacist) Octavia Bruckner, MD as Consulting Physician (Ophthalmology)  ASSESSMENT & PLAN:  Myeloproliferative neoplasm [facility]) We reviewed recent test results His CBC is normal He will continue current dose of hydroxyurea  as directed He has appointment to see his primary care doctor in the spring and I recommend he has his CBC rechecked then I will see him again in 6 months for further follow-up We discussed certain circumstances that could cause bone marrow suppression such as infection, major distress and surgery and those can sometimes affect his blood count  No orders of the defined types were placed in this encounter.   All questions were answered. The patient knows to call the clinic with any problems, questions or concerns. The total time spent in the appointment was 20 minutes encounter with patients including review of chart and various tests results, discussions about plan of care and coordination of care plan   Almarie Bedford, MD 11/24/2023 12:43 PM  INTERVAL HISTORY: Please see below for problem oriented charting. he returns for surveillance follow-up  REVIEW OF SYSTEMS:   Constitutional: Denies fevers, chills or abnormal weight loss Eyes: Denies blurriness of vision Ears, nose, mouth, throat, and face: Denies mucositis or sore throat Respiratory: Denies cough, dyspnea or wheezes Cardiovascular: Denies palpitation, chest discomfort or lower extremity swelling Gastrointestinal:  Denies nausea, heartburn or change in bowel habits Skin: Denies abnormal skin rashes Lymphatics: Denies new lymphadenopathy or easy bruising Neurological:Denies numbness, tingling or new  weaknesses Behavioral/Psych: Mood is stable, no new changes  All other systems were reviewed with the patient and are negative.  I have reviewed the past medical history, past surgical history, social history and family history with the patient and they are unchanged from previous note.  ALLERGIES:  is allergic to nitrofurantoin, penicillins, sulfonamide derivatives, amlodipine , shellfish allergy, and tamsulosin.  MEDICATIONS:  Current Outpatient Medications  Medication Sig Dispense Refill   amLODipine  (NORVASC ) 5 MG tablet TAKE 1 TABLET BY MOUTH DAILY 100 tablet 0   aspirin  EC 81 MG tablet Take 81 mg by mouth daily. Swallow whole.     famotidine  (PEPCID ) 20 MG tablet TAKE 1 TABLET BY MOUTH AT  BEDTIME FOLLOW-UP APPOINTMENT  DUE IN OCT MUST SEE PROVIDER FOR FUTURE REFILLS 100 tablet 0   fexofenadine (ALLEGRA) 180 MG tablet Take 180 mg by mouth daily as needed (allergies).     fluticasone  (FLONASE ) 50 MCG/ACT nasal spray Place 2 sprays into the nose 2 (two) times daily as needed for rhinitis or allergies.     Hydrocortisone-Iodoquinol 1-1 % CREA Apply 1 application topically daily.     hydroxyurea  (HYDREA ) 500 MG capsule Take 1 capsule (500mg ) on Mondays, Wednesdays and Fridays; take 2 capsules (1000mg ) the rest of the week 180 capsule 1   Multiple Vitamins-Minerals (PRESERVISION AREDS PO) Take 1 tablet by mouth daily.     sertraline  (ZOLOFT ) 50 MG tablet Take 1 tablet (50 mg total) by mouth daily. 30 tablet 5   telmisartan -hydrochlorothiazide  (MICARDIS  HCT) 40-12.5 MG tablet TAKE 1 TABLET BY MOUTH DAILY 90 tablet 3   No current facility-administered medications for this visit.    SUMMARY OF ONCOLOGIC HISTORY: He was found to have abnormal CBC from recent blood draw.  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  He will take 500 mg on Mondays, Wednesdays and Fridays and the rest of the week 1000 mg  PHYSICAL EXAMINATION: ECOG PERFORMANCE STATUS: 0 - Asymptomatic  Vitals:   11/24/23 1213  BP: (!) 141/61  Pulse: 67  Resp: 16  Temp: 98.1 F (36.7 C)  SpO2: 96%   Filed Weights   11/24/23 1213  Weight: 174 lb 14.4 oz (79.3 kg)    GENERAL:alert, no distress and comfortable   LABORATORY DATA:  I have reviewed the data as listed    Component Value Date/Time   NA 137 12/10/2022 0849   K 4.3 12/10/2022 0849   CL 101 12/10/2022 0849   CO2 29 12/10/2022 0849   GLUCOSE 112 (H) 12/10/2022 0849   BUN 22 12/10/2022 0849   CREATININE 1.07 12/10/2022 0849   CREATININE 1.00 05/23/2020 0819   CALCIUM 9.7 12/10/2022 0849   PROT 6.9 12/10/2022 0849   ALBUMIN 4.6 12/10/2022 0849   AST 16 12/10/2022 0849   ALT 12 12/10/2022 0849   ALKPHOS 52 12/10/2022 0849   BILITOT 0.8 12/10/2022 0849   GFRNONAA >60 06/28/2018 0345   GFRAA >60 06/28/2018 0345    No results found for: SPEP, UPEP  Lab Results  Component Value Date   WBC 8.6 11/24/2023   NEUTROABS 5.1 11/24/2023   HGB 13.3 11/24/2023   HCT 38.1 (L) 11/24/2023   MCV 110.8 (H) 11/24/2023   PLT 262 11/24/2023      Chemistry      Component Value Date/Time   NA 137 12/10/2022 0849   K 4.3 12/10/2022 0849   CL 101 12/10/2022 0849   CO2 29 12/10/2022 0849   BUN 22 12/10/2022 0849    CREATININE 1.07 12/10/2022 0849   CREATININE 1.00 05/23/2020 0819      Component Value Date/Time   CALCIUM 9.7 12/10/2022 0849   ALKPHOS 52 12/10/2022 0849   AST 16 12/10/2022 0849   ALT 12 12/10/2022 0849   BILITOT 0.8 12/10/2022 0849

## 2023-11-25 ENCOUNTER — Telehealth: Payer: Self-pay | Admitting: Internal Medicine

## 2023-11-25 NOTE — Telephone Encounter (Signed)
 Copied from CRM [id number]. Topic: General - Other >> Nov 25, 2023  1:39 PM Victoria A wrote: Reason for CRM: Patient's wife called because she said some one from the office called her but she was able to reach the phone. Mrs. Lepkowski said that her computer has not been working so she did not know if anyone was trying to reach her or husband through [employer]. She also requested for Tobias to give her a call back

## 2023-11-25 NOTE — Telephone Encounter (Signed)
Left message for patient today. 

## 2023-11-25 NOTE — Telephone Encounter (Signed)
 Called patient back and left message.   If she calls back please see if they were able to schedule with urology or we need to see him and place referral.

## 2023-11-25 NOTE — Telephone Encounter (Signed)
 Patient has an appointment with Dr. Logan Bores on 02/09/2024.  Patient has a prescription that needs to be renewed - [place] - [facility] - Please let patient know if Dr. Lawerance Bach can not fill this.  Phone:  [phone] or cell:  [phone]

## 2023-11-26 MED ORDER — ELMIRON 100 MG PO CAPS: 100.0000 mg | ORAL_CAPSULE | Freq: Three times a day (TID) | ORAL | 0 refills | Status: DC

## 2023-11-26 NOTE — Telephone Encounter (Signed)
 30-day supply sent to the pharmacy-I would like urology to make sure this medication is appropriate for him.

## 2023-11-26 NOTE — Addendum Note (Signed)
 Addended by: Colene Dauphin on: 11/26/2023 04:38 PM   Modules accepted: Orders

## 2023-11-27 ENCOUNTER — Ambulatory Visit: Payer: Medicare Other | Admitting: Internal Medicine

## 2023-11-27 NOTE — Telephone Encounter (Signed)
Attempted to reach patient today to let him know about medication being sent in.  If wife calls back please relay message from Dr. Lawerance Bach.

## 2023-12-12 ENCOUNTER — Other Ambulatory Visit: Payer: Self-pay | Admitting: Internal Medicine

## 2023-12-19 ENCOUNTER — Other Ambulatory Visit: Payer: Self-pay | Admitting: Internal Medicine

## 2023-12-19 DIAGNOSIS — I10 Essential (primary) hypertension: Secondary | ICD-10-CM

## 2023-12-24 ENCOUNTER — Ambulatory Visit (INDEPENDENT_AMBULATORY_CARE_PROVIDER_SITE_OTHER): Payer: Medicare Other | Admitting: Family Medicine

## 2023-12-24 ENCOUNTER — Encounter: Payer: Self-pay | Admitting: Family Medicine

## 2023-12-24 VITALS — BP 122/80 | HR 81 | Temp 97.7°F | Ht 70.0 in | Wt 175.0 lb

## 2023-12-24 DIAGNOSIS — D84821 Immunodeficiency due to drugs: Secondary | ICD-10-CM | POA: Diagnosis not present

## 2023-12-24 DIAGNOSIS — R0602 Shortness of breath: Secondary | ICD-10-CM | POA: Diagnosis not present

## 2023-12-24 DIAGNOSIS — R0609 Other forms of dyspnea: Secondary | ICD-10-CM

## 2023-12-24 DIAGNOSIS — Z79899 Other long term (current) drug therapy: Secondary | ICD-10-CM

## 2023-12-24 LAB — COMPREHENSIVE METABOLIC PANEL
ALT: 15 U/L (ref 0–53)
AST: 20 U/L (ref 0–37)
Albumin: 4.5 g/dL (ref 3.5–5.2)
Alkaline Phosphatase: 70 U/L (ref 39–117)
BUN: 21 mg/dL (ref 6–23)
CO2: 28 meq/L (ref 19–32)
Calcium: 9.9 mg/dL (ref 8.4–10.5)
Chloride: 103 meq/L (ref 96–112)
Creatinine, Ser: 1.22 mg/dL (ref 0.40–1.50)
GFR: 53.26 mL/min — ABNORMAL LOW (ref >60.00)
Glucose, Bld: 69 mg/dL — ABNORMAL LOW (ref 70–99)
Potassium: 4.8 meq/L (ref 3.5–5.1)
Sodium: 140 meq/L (ref 135–145)
Total Bilirubin: 0.7 mg/dL (ref 0.2–1.2)
Total Protein: 7.1 g/dL (ref 6.0–8.3)

## 2023-12-24 LAB — CBC WITH DIFFERENTIAL/PLATELET
Basophils Absolute: 0.1 10*3/uL (ref 0.0–0.1)
Basophils Relative: 1.1 % (ref 0.0–3.0)
Eosinophils Absolute: 0.3 10*3/uL (ref 0.0–0.7)
Eosinophils Relative: 2.8 % (ref 0.0–5.0)
HCT: 43.1 % (ref 39.0–52.0)
Hemoglobin: 14.4 g/dL (ref 13.0–17.0)
Lymphocytes Relative: 20.1 % (ref 12.0–46.0)
Lymphs Abs: 2.3 10*3/uL (ref 0.7–4.0)
MCHC: 33.3 g/dL (ref 30.0–36.0)
MCV: 115.6 fL — ABNORMAL HIGH (ref 78.0–100.0)
Monocytes Absolute: 1.3 10*3/uL — ABNORMAL HIGH (ref 0.1–1.0)
Monocytes Relative: 11 % (ref 3.0–12.0)
Neutro Abs: 7.4 10*3/uL (ref 1.4–7.7)
Neutrophils Relative %: 65 % (ref 43.0–77.0)
Platelets: 271 10*3/uL (ref 150.0–400.0)
RBC: 3.73 Mil/uL — ABNORMAL LOW (ref 4.22–5.81)
RDW: 14.1 % (ref 11.5–15.5)
WBC: 11.4 10*3/uL — ABNORMAL HIGH (ref 4.0–10.5)

## 2023-12-24 LAB — BRAIN NATRIURETIC PEPTIDE: Pro B Natriuretic peptide (BNP): 49 pg/mL (ref 0.0–100.0)

## 2023-12-24 NOTE — Patient Instructions (Addendum)
We are checking labs today, will be in contact with any results that require further attention  I have placed a referral for you for cardiology today.  Someone will be reaching out to get you scheduled.   Follow-up with me for new or worsening symptoms.  Please follow up in the ER for sustained elevated heart rate, loss of consciousness, worsening shortness of breath for further eval and workup.

## 2023-12-24 NOTE — Progress Notes (Signed)
Acute Office Visit  Subjective:     Patient ID: Mark Copeland, male    DOB: 29-Dec-1933, 89 y.o.   MRN: [id number]  Chief Complaint  Patient presents with   Acute Visit    Ongoing for a few months, no matter what he does he is out of breath. Normally pretty active    HPI Patient is in today for shortness of breath for the last few months.  States that it does not matter what he does, he is always short of breath. He is very active but has been unable to tolerate exercise to SOB.  Reports increased BLE swelling.  Reports this is worsening and not improving.  No recent echo, does not currently see cardiology. Accompanied by his wife today. Reports normal BP at home, sometimes with elevated HR up to 100.  Denies chest pain, palpitations, numbness, tingling, recent illnes or sick contacts, other symptoms.  ROS Per HPI      Objective:    BP 122/80 (BP Location: Left Arm, Patient Position: Sitting, Cuff Size: Normal)   Pulse 81   Temp 97.7 F (36.5 C) (Oral)   Ht 5\' 10"  (1.778 m)   Wt 175 lb (79.4 kg)   SpO2 92%   BMI 25.11 kg/m    Physical Exam Vitals and nursing note reviewed.  Constitutional:      General: He is not in acute distress.    Appearance: Normal appearance. He is well-developed and normal weight.  HENT:     Head: Normocephalic and atraumatic.     Nose: Nose normal.     Mouth/Throat:     Mouth: Mucous membranes are moist.     Pharynx: Oropharynx is clear.  Eyes:     Extraocular Movements: Extraocular movements intact.     Conjunctiva/sclera: Conjunctivae normal.     Pupils: Pupils are equal, round, and reactive to light.  Cardiovascular:     Rate and Rhythm: Normal rate and regular rhythm.     Pulses: Normal pulses.  Pulmonary:     Effort: Pulmonary effort is normal. No respiratory distress.     Breath sounds: No stridor. No wheezing, rhonchi or rales.  Chest:     Chest wall: No tenderness.  Musculoskeletal:        General: Normal range  of motion.     Cervical back: Normal range of motion and neck supple.     Right lower leg: Edema (+1 nonpitting) present.     Left lower leg: Edema (+2 pitting) present.  Skin:    General: Skin is warm and dry.     Capillary Refill: Capillary refill takes less than 2 seconds.  Neurological:     General: No focal deficit present.     Mental Status: He is alert and oriented to person, place, and time.  Psychiatric:        Mood and Affect: Mood normal.        Behavior: Behavior normal.        Thought Content: Thought content normal.     No results found for any visits on 12/24/23.  ED ECG REPORT   Date: 12/24/2023  EKG Time: 1:05 PM  Rate: 76  Rhythm: normal sinus rhythm,  normal EKG, normal sinus rhythm, unchanged from previous tracings  Axis: NA   Intervals:none  ST&T Change: NA  Narrative Interpretation: NSR            Assessment & Plan:  1. SOB (shortness of breath) (Primary)  - Ambulatory  referral to Cardiology - EKG 12-Lead  2. Immunocompromised state due to drug therapy (HCC)  - Labs today - F/u heme/onc as scheduled  3. DOE (dyspnea on exertion)  - CBC with Differential/Platelet - Comprehensive metabolic panel - Brain natriuretic peptide - D-dimer, quantitative - EKG 12-Lead  - If D dimer positive, will direct to ER for further eval and tx - If BNP positive, will rx lasix    No orders of the defined types were placed in this encounter.   Return if symptoms worsen or fail to improve.  Moshe Cipro, FNP

## 2023-12-25 ENCOUNTER — Observation Stay (HOSPITAL_COMMUNITY)
Admission: EM | Admit: 2023-12-25 | Discharge: 2023-12-27 | Disposition: A | Discharge status: Home / Self Care | Payer: Medicare Other | Attending: Internal Medicine | Admitting: Internal Medicine

## 2023-12-25 ENCOUNTER — Encounter (HOSPITAL_COMMUNITY): Payer: Self-pay

## 2023-12-25 ENCOUNTER — Telehealth: Payer: Self-pay | Admitting: Internal Medicine

## 2023-12-25 ENCOUNTER — Other Ambulatory Visit: Payer: Self-pay

## 2023-12-25 ENCOUNTER — Telehealth: Payer: Self-pay

## 2023-12-25 DIAGNOSIS — I2510 Atherosclerotic heart disease of native coronary artery without angina pectoris: Secondary | ICD-10-CM | POA: Diagnosis not present

## 2023-12-25 DIAGNOSIS — I2699 Other pulmonary embolism without acute cor pulmonale: Secondary | ICD-10-CM | POA: Diagnosis not present

## 2023-12-25 DIAGNOSIS — D471 Chronic myeloproliferative disease: Secondary | ICD-10-CM | POA: Diagnosis present

## 2023-12-25 DIAGNOSIS — I82402 Acute embolism and thrombosis of unspecified deep veins of left lower extremity: Secondary | ICD-10-CM

## 2023-12-25 DIAGNOSIS — R0602 Shortness of breath: Secondary | ICD-10-CM | POA: Diagnosis not present

## 2023-12-25 DIAGNOSIS — Z7982 Long term (current) use of aspirin: Secondary | ICD-10-CM | POA: Insufficient documentation

## 2023-12-25 DIAGNOSIS — I10 Essential (primary) hypertension: Secondary | ICD-10-CM | POA: Diagnosis present

## 2023-12-25 DIAGNOSIS — R059 Cough, unspecified: Secondary | ICD-10-CM | POA: Diagnosis not present

## 2023-12-25 DIAGNOSIS — Z79899 Other long term (current) drug therapy: Secondary | ICD-10-CM | POA: Insufficient documentation

## 2023-12-25 DIAGNOSIS — Z87891 Personal history of nicotine dependence: Secondary | ICD-10-CM | POA: Insufficient documentation

## 2023-12-25 DIAGNOSIS — E785 Hyperlipidemia, unspecified: Secondary | ICD-10-CM | POA: Diagnosis present

## 2023-12-25 DIAGNOSIS — R600 Localized edema: Secondary | ICD-10-CM | POA: Diagnosis not present

## 2023-12-25 DIAGNOSIS — R0600 Dyspnea, unspecified: Secondary | ICD-10-CM | POA: Diagnosis not present

## 2023-12-25 DIAGNOSIS — Z85828 Personal history of other malignant neoplasm of skin: Secondary | ICD-10-CM | POA: Insufficient documentation

## 2023-12-25 LAB — D-DIMER, QUANTITATIVE: D-Dimer, Quant: 8 ug{FEU}/mL — ABNORMAL HIGH (ref <0.50)

## 2023-12-25 NOTE — ED Triage Notes (Addendum)
PT states his PCP sent him to get a chest xray because he has had an occasional cough, leg swelling & SOB x 2 months. Pt reports getting SOB with exercise. Denies any s/s at time of triage.  Pt was called & told his D-Dimer was 8.

## 2023-12-25 NOTE — Telephone Encounter (Signed)
Returned call to wife. Dr. Bertis Ruddy reviewed yesterdays labs as requested. The elevated WBC could be due to infection. Otherwise his labs are stable. DDimer is not specific, agree with PCP recommendation for further eval. Please keep the office posted and call back with updates. Wife verbalized understanding.

## 2023-12-25 NOTE — Telephone Encounter (Signed)
Notified of elevated D Dimer  I called pt and family all on speaker phone  D/w that although elevated D dimer not specific, we can't r/o PE - pt referred to [facility] ED

## 2023-12-26 ENCOUNTER — Other Ambulatory Visit: Payer: Self-pay | Admitting: Family Medicine

## 2023-12-26 ENCOUNTER — Inpatient Hospital Stay (HOSPITAL_COMMUNITY): Payer: Medicare Other

## 2023-12-26 ENCOUNTER — Emergency Department (HOSPITAL_COMMUNITY): Payer: Medicare Other

## 2023-12-26 ENCOUNTER — Encounter (HOSPITAL_COMMUNITY): Payer: Self-pay

## 2023-12-26 DIAGNOSIS — Z86711 Personal history of pulmonary embolism: Secondary | ICD-10-CM

## 2023-12-26 DIAGNOSIS — R0602 Shortness of breath: Secondary | ICD-10-CM

## 2023-12-26 DIAGNOSIS — I2699 Other pulmonary embolism without acute cor pulmonale: Secondary | ICD-10-CM

## 2023-12-26 DIAGNOSIS — I2510 Atherosclerotic heart disease of native coronary artery without angina pectoris: Secondary | ICD-10-CM | POA: Diagnosis not present

## 2023-12-26 DIAGNOSIS — R059 Cough, unspecified: Secondary | ICD-10-CM | POA: Diagnosis not present

## 2023-12-26 DIAGNOSIS — R0600 Dyspnea, unspecified: Secondary | ICD-10-CM | POA: Diagnosis not present

## 2023-12-26 LAB — ECHOCARDIOGRAM COMPLETE
AR max vel: 2.6 cm2
AV Area VTI: 2.69 cm2
AV Area mean vel: 2.43 cm2
AV Mean grad: 3 mm[Hg]
AV Peak grad: 6.6 mm[Hg]
AV Vena cont: 0.5 cm
Ao pk vel: 1.28 m/s
Area-P 1/2: 2.95 cm2
Height: 70 in
P 1/2 time: 517 ms
S' Lateral: 2 cm
Weight: 2799.98 [oz_av]

## 2023-12-26 LAB — COMPREHENSIVE METABOLIC PANEL
ALT: 17 U/L (ref 0–44)
AST: 20 U/L (ref 15–41)
Albumin: 4 g/dL (ref 3.5–5.0)
Alkaline Phosphatase: 59 U/L (ref 38–126)
Anion gap: 8 (ref 5–15)
BUN: 22 mg/dL (ref 8–23)
CO2: 25 mmol/L (ref 22–32)
Calcium: 9.6 mg/dL (ref 8.9–10.3)
Chloride: 105 mmol/L (ref 98–111)
Creatinine, Ser: 1.1 mg/dL (ref 0.61–1.24)
GFR, Estimated: >60 mL/min (ref >60)
Glucose, Bld: 114 mg/dL — ABNORMAL HIGH (ref 70–99)
Potassium: 4.6 mmol/L (ref 3.5–5.1)
Sodium: 138 mmol/L (ref 135–145)
Total Bilirubin: 0.8 mg/dL (ref 0.0–1.2)
Total Protein: 6.9 g/dL (ref 6.5–8.1)

## 2023-12-26 LAB — CBC WITH DIFFERENTIAL/PLATELET
Abs Immature Granulocytes: 0.11 10*3/uL — ABNORMAL HIGH (ref 0.00–0.07)
Basophils Absolute: 0.1 10*3/uL (ref 0.0–0.1)
Basophils Relative: 1 %
Eosinophils Absolute: 0.3 10*3/uL (ref 0.0–0.5)
Eosinophils Relative: 4 %
HCT: 41.1 % (ref 39.0–52.0)
Hemoglobin: 13.9 g/dL (ref 13.0–17.0)
Immature Granulocytes: 1 %
Lymphocytes Relative: 35 %
Lymphs Abs: 3.1 10*3/uL (ref 0.7–4.0)
MCH: 38.5 pg — ABNORMAL HIGH (ref 26.0–34.0)
MCHC: 33.8 g/dL (ref 30.0–36.0)
MCV: 113.9 fL — ABNORMAL HIGH (ref 80.0–100.0)
Monocytes Absolute: 1.1 10*3/uL — ABNORMAL HIGH (ref 0.1–1.0)
Monocytes Relative: 12 %
Neutro Abs: 4.2 10*3/uL (ref 1.7–7.7)
Neutrophils Relative %: 47 %
Platelets: 242 10*3/uL (ref 150–400)
RBC: 3.61 MIL/uL — ABNORMAL LOW (ref 4.22–5.81)
RDW: 13.3 % (ref 11.5–15.5)
WBC: 9 10*3/uL (ref 4.0–10.5)
nRBC: 0 % (ref 0.0–0.2)

## 2023-12-26 LAB — PROTIME-INR
INR: 1 (ref 0.8–1.2)
Prothrombin Time: 13.7 s (ref 11.4–15.2)

## 2023-12-26 LAB — HEPARIN LEVEL (UNFRACTIONATED)
Heparin Unfractionated: 0.27 [IU]/mL — ABNORMAL LOW (ref 0.30–0.70)
Heparin Unfractionated: 0.46 [IU]/mL (ref 0.30–0.70)

## 2023-12-26 LAB — BRAIN NATRIURETIC PEPTIDE: B Natriuretic Peptide: 43.9 pg/mL (ref 0.0–100.0)

## 2023-12-26 LAB — APTT
aPTT: 28 s (ref 24–36)
aPTT: 59 s — ABNORMAL HIGH (ref 24–36)

## 2023-12-26 LAB — TROPONIN I (HIGH SENSITIVITY)
Troponin I (High Sensitivity): 7 ng/L (ref <18)
Troponin I (High Sensitivity): 9 ng/L (ref <18)

## 2023-12-26 MED ORDER — FUROSEMIDE 10 MG/ML IJ SOLN
20.0000 mg | Freq: Once | INTRAMUSCULAR | Status: AC
  Administered 2023-12-26: 20 mg via INTRAVENOUS
  Filled 2023-12-26: qty 4

## 2023-12-26 MED ORDER — SODIUM CHLORIDE (PF) 0.9 % IJ SOLN
INTRAMUSCULAR | Status: AC
  Filled 2023-12-26: qty 50

## 2023-12-26 MED ORDER — ACETAMINOPHEN 650 MG RE SUPP: 650.0000 mg | Freq: Four times a day (QID) | RECTAL | Status: DC | PRN

## 2023-12-26 MED ORDER — SERTRALINE HCL 50 MG PO TABS
50.0000 mg | ORAL_TABLET | Freq: Every day | ORAL | Status: DC
  Administered 2023-12-26 – 2023-12-27 (×2): 50 mg via ORAL
  Filled 2023-12-26 (×2): qty 1

## 2023-12-26 MED ORDER — HEPARIN (PORCINE) 25000 UT/250ML-% IV SOLN
1450.0000 [IU]/h | INTRAVENOUS | Status: AC
  Administered 2023-12-26: 1450 [IU]/h via INTRAVENOUS
  Administered 2023-12-26: 1300 [IU]/h via INTRAVENOUS
  Filled 2023-12-26 (×2): qty 250

## 2023-12-26 MED ORDER — ACETAMINOPHEN 325 MG PO TABS: 650.0000 mg | ORAL_TABLET | Freq: Four times a day (QID) | ORAL | Status: DC | PRN

## 2023-12-26 MED ORDER — HYDROXYUREA 500 MG PO CAPS: 500.0000 mg | ORAL_CAPSULE | Freq: Every day | ORAL | Status: DC

## 2023-12-26 MED ORDER — ONDANSETRON HCL 4 MG/2ML IJ SOLN: 4.0000 mg | Freq: Four times a day (QID) | INTRAMUSCULAR | Status: DC | PRN

## 2023-12-26 MED ORDER — TELMISARTAN-HCTZ 40-12.5 MG PO TABS: 1.0000 | ORAL_TABLET | Freq: Every day | ORAL | Status: DC

## 2023-12-26 MED ORDER — HEPARIN BOLUS VIA INFUSION
1000.0000 [IU] | Freq: Once | INTRAVENOUS | Status: AC
  Administered 2023-12-26: 1000 [IU] via INTRAVENOUS
  Filled 2023-12-26: qty 1000

## 2023-12-26 MED ORDER — OXYCODONE HCL 5 MG PO TABS: 5.0000 mg | ORAL_TABLET | ORAL | Status: DC | PRN

## 2023-12-26 MED ORDER — HEPARIN BOLUS VIA INFUSION
4000.0000 [IU] | Freq: Once | INTRAVENOUS | Status: AC
  Administered 2023-12-26: 4000 [IU] via INTRAVENOUS
  Filled 2023-12-26: qty 4000

## 2023-12-26 MED ORDER — ALBUTEROL SULFATE (2.5 MG/3ML) 0.083% IN NEBU: 2.5000 mg | INHALATION_SOLUTION | RESPIRATORY_TRACT | Status: DC | PRN

## 2023-12-26 MED ORDER — ASPIRIN 81 MG PO TBEC: 81.0000 mg | DELAYED_RELEASE_TABLET | Freq: Every day | ORAL | Status: DC

## 2023-12-26 MED ORDER — IOHEXOL 350 MG/ML SOLN
75.0000 mL | Freq: Once | INTRAVENOUS | Status: AC | PRN
  Administered 2023-12-26: 75 mL via INTRAVENOUS

## 2023-12-26 MED ORDER — AMLODIPINE BESYLATE 5 MG PO TABS: 5.0000 mg | ORAL_TABLET | Freq: Every day | ORAL | Status: DC

## 2023-12-26 MED ORDER — FAMOTIDINE 20 MG PO TABS
20.0000 mg | ORAL_TABLET | Freq: Every day | ORAL | Status: DC
  Administered 2023-12-26: 20 mg via ORAL
  Filled 2023-12-26: qty 1

## 2023-12-26 MED ORDER — ONDANSETRON HCL 4 MG PO TABS: 4.0000 mg | ORAL_TABLET | Freq: Four times a day (QID) | ORAL | Status: DC | PRN

## 2023-12-26 MED ORDER — HYDROXYUREA 500 MG PO CAPS
1000.0000 mg | ORAL_CAPSULE | ORAL | Status: DC
  Administered 2023-12-27: 1000 mg via ORAL
  Filled 2023-12-26: qty 2

## 2023-12-26 MED ORDER — HYDROXYUREA 500 MG PO CAPS
500.0000 mg | ORAL_CAPSULE | ORAL | Status: DC
  Administered 2023-12-26: 500 mg via ORAL
  Filled 2023-12-26: qty 1

## 2023-12-26 MED ORDER — FLUTICASONE PROPIONATE 50 MCG/ACT NA SUSP: 2.0000 | Freq: Two times a day (BID) | NASAL | Status: DC | PRN

## 2023-12-26 MED ORDER — HYDROXYUREA 500 MG PO CAPS
500.0000 mg | ORAL_CAPSULE | ORAL | Status: DC
Start: 2023-12-26 — End: 2023-12-26

## 2023-12-26 NOTE — ED Notes (Signed)
Echocardiogram at bedside.
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
## 2023-12-26 NOTE — Progress Notes (Signed)
BLE venous duplex has been completed.  Preliminary findings given to Dr. Kirby Crigler.   Results can be found under chart review under CV PROC. 12/26/2023 12:12 PM Magdaline Zollars RVT, RDMS
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
## 2023-12-26 NOTE — Progress Notes (Signed)
*  PRELIMINARY RESULTS* Echocardiogram 2D Echocardiogram has been performed.  Mark Copeland 12/26/2023, 2:13 PM
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
## 2023-12-26 NOTE — ED Notes (Addendum)
Will get patients temperature after he finishes drinking his coffee. Patient was drinking coffee when I went in to get his temperature.

## 2023-12-26 NOTE — Plan of Care (Signed)

## 2023-12-26 NOTE — Progress Notes (Signed)
Patient made aware of results via Dr.John, patient is currently admitted in [facility].
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
## 2023-12-26 NOTE — Plan of Care (Signed)
Problem: Clinical Measurements: Goal: Ability to maintain clinical measurements within normal limits will improve Outcome: Progressing Goal: Will remain free from infection Outcome: Progressing Goal: Diagnostic test results will improve Outcome: Progressing   Problem: Coping: Goal: Level of anxiety will decrease Outcome: Progressing
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
## 2023-12-27 ENCOUNTER — Other Ambulatory Visit (HOSPITAL_COMMUNITY): Payer: Self-pay

## 2023-12-27 DIAGNOSIS — I824Y2 Acute embolism and thrombosis of unspecified deep veins of left proximal lower extremity: Secondary | ICD-10-CM

## 2023-12-27 DIAGNOSIS — I2699 Other pulmonary embolism without acute cor pulmonale: Secondary | ICD-10-CM | POA: Diagnosis not present

## 2023-12-27 DIAGNOSIS — I82402 Acute embolism and thrombosis of unspecified deep veins of left lower extremity: Secondary | ICD-10-CM

## 2023-12-27 LAB — BASIC METABOLIC PANEL
Anion gap: 7 (ref 5–15)
BUN: 20 mg/dL (ref 8–23)
CO2: 26 mmol/L (ref 22–32)
Calcium: 9 mg/dL (ref 8.9–10.3)
Chloride: 106 mmol/L (ref 98–111)
Creatinine, Ser: 1.16 mg/dL (ref 0.61–1.24)
GFR, Estimated: >60 mL/min (ref >60)
Glucose, Bld: 107 mg/dL — ABNORMAL HIGH (ref 70–99)
Potassium: 3.9 mmol/L (ref 3.5–5.1)
Sodium: 139 mmol/L (ref 135–145)

## 2023-12-27 LAB — CBC WITH DIFFERENTIAL/PLATELET
Abs Immature Granulocytes: 0.1 10*3/uL — ABNORMAL HIGH (ref 0.00–0.07)
Basophils Absolute: 0.1 10*3/uL (ref 0.0–0.1)
Basophils Relative: 1 %
Eosinophils Absolute: 0.5 10*3/uL (ref 0.0–0.5)
Eosinophils Relative: 6 %
HCT: 38 % — ABNORMAL LOW (ref 39.0–52.0)
Hemoglobin: 12.3 g/dL — ABNORMAL LOW (ref 13.0–17.0)
Immature Granulocytes: 1 %
Lymphocytes Relative: 37 %
Lymphs Abs: 3.2 10*3/uL (ref 0.7–4.0)
MCH: 37.7 pg — ABNORMAL HIGH (ref 26.0–34.0)
MCHC: 32.4 g/dL (ref 30.0–36.0)
MCV: 116.6 fL — ABNORMAL HIGH (ref 80.0–100.0)
Monocytes Absolute: 0.9 10*3/uL (ref 0.1–1.0)
Monocytes Relative: 10 %
Neutro Abs: 3.8 10*3/uL (ref 1.7–7.7)
Neutrophils Relative %: 45 %
Platelets: 234 10*3/uL (ref 150–400)
RBC: 3.26 MIL/uL — ABNORMAL LOW (ref 4.22–5.81)
RDW: 13.5 % (ref 11.5–15.5)
WBC: 8.6 10*3/uL (ref 4.0–10.5)
nRBC: 0 % (ref 0.0–0.2)

## 2023-12-27 LAB — HEPARIN LEVEL (UNFRACTIONATED): Heparin Unfractionated: 0.45 [IU]/mL (ref 0.30–0.70)

## 2023-12-27 MED ORDER — RIVAROXABAN (XARELTO) VTE STARTER PACK (15 & 20 MG)
ORAL_TABLET | ORAL | 0 refills | Status: DC
Start: 2023-12-27 — End: 2024-02-13
  Filled 2023-12-27: qty 51, 28d supply, fill #0

## 2023-12-27 MED ORDER — RIVAROXABAN 20 MG PO TABS: 20.0000 mg | ORAL_TABLET | Freq: Every day | ORAL | 5 refills | Status: DC

## 2023-12-27 MED ORDER — RIVAROXABAN 20 MG PO TABS: 20.0000 mg | ORAL_TABLET | Freq: Every day | ORAL | Status: DC

## 2023-12-27 MED ORDER — RIVAROXABAN 15 MG PO TABS
15.0000 mg | ORAL_TABLET | Freq: Two times a day (BID) | ORAL | Status: DC
  Administered 2023-12-27: 15 mg via ORAL
  Filled 2023-12-27: qty 1

## 2023-12-27 NOTE — Assessment & Plan Note (Signed)
-   No meds noted on med rec
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
## 2023-12-27 NOTE — Assessment & Plan Note (Signed)
-   follows with Dr. Bertis Ruddy; seen last on 11/24/23 - continue hydroxyurea

## 2023-12-27 NOTE — Care Management CC44 (Signed)
 Condition Code 44 Documentation Completed  Patient Details  Name: Mark Copeland MRN: [id number] Date of Birth: [birth year]   Condition Code 44 given:  Yes Patient signature on Condition Code 44 notice:  Yes Documentation of 2 MD's agreement:  Yes Code 44 added to claim:  Yes    Jenesis Martin, LCSW 12/27/2023, 12:35 PM
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
## 2023-12-27 NOTE — Discharge Instructions (Addendum)
 Information on my medicine - XARELTO (rivaroxaban)   WHY WAS XARELTO PRESCRIBED FOR YOU? Xarelto was prescribed to treat blood clots that may have been found in the veins of your legs (deep vein thrombosis) or in your lungs (pulmonary embolism) and to reduce the risk of them occurring again.  What do you need to know about Xarelto? The starting dose is one 15 mg tablet taken TWICE daily with food for the FIRST 21 DAYS then on 01/17/24 the dose is changed to one 20 mg tablet taken ONCE A DAY with your evening meal.  DO NOT stop taking Xarelto without talking to the health care provider who prescribed the medication.  Refill your prescription for 20 mg tablets before you run out.  After discharge, you should have regular check-up appointments with your healthcare provider that is prescribing your Xarelto.  In the future your dose may need to be changed if your kidney function changes by a significant amount.  What do you do if you miss a dose? If you are taking Xarelto TWICE DAILY and you miss a dose, take it as soon as you remember. You may take two 15 mg tablets (total 30 mg) at the same time then resume your regularly scheduled 15 mg twice daily the next day.  If you are taking Xarelto ONCE DAILY and you miss a dose, take it as soon as you remember on the same day then continue your regularly scheduled once daily regimen the next day. Do not take two doses of Xarelto at the same time.   Important Safety Information Xarelto is a blood thinner medicine that can cause bleeding. You should call your healthcare provider right away if you experience any of the following: Bleeding from an injury or your nose that does not stop. Unusual colored urine (red or dark brown) or unusual colored stools (red or black). Unusual bruising for unknown reasons. A serious fall or if you hit your head (even if there is no bleeding).  Some medicines may interact with Xarelto and might increase your risk  of bleeding while on Xarelto. To help avoid this, consult your healthcare provider or pharmacist prior to using any new prescription or non-prescription medications, including herbals, vitamins, non-steroidal anti-inflammatory drugs (NSAIDs) and supplements.  This website has more information on Xarelto: VisitDestination.com.br.

## 2023-12-27 NOTE — Progress Notes (Signed)
 PHARMACY - ANTICOAGULATION CONSULT NOTE  Pharmacy Consult for Xarelto Indication: pulmonary embolus  Allergies  Allergen Reactions   Nitrofurantoin Nausea Only   Penicillins     Hives Because of a history of documented adverse serious drug reaction;Medi Alert bracelet  is recommended   Sulfonamide Derivatives     Scrotal exfoliative rash Because of a history of documented adverse serious drug reaction;Medi Alert bracelet  is recommended    Amlodipine     See 08/10/2013   Shellfish Allergy Diarrhea and Nausea And Vomiting    Gi problems    Tamsulosin     REACTION: ? caused palpitations    Patient Measurements: Height: 5\' 10"  (177.8 cm) Weight: 79.4 kg (175 lb) IBW/kg (Calculated) : 73  Vital Signs: Temp: 97.7 F (36.5 C) (02/15 0805) Temp Source: Oral (02/15 0805) BP: 147/74 (02/15 0805) Pulse Rate: 64 (02/15 0805)  Labs: Recent Labs    12/24/23 1158 12/26/23 0020 12/26/23 0900 12/26/23 1101 12/26/23 2216 12/27/23 0427 12/27/23 0556  HGB 14.4 13.9  --   --   --   --  12.3*  HCT 43.1 41.1  --   --   --   --  38.0*  PLT 271.0 242  --   --   --   --  234  APTT  --  28 59*  --   --   --   --   LABPROT  --  13.7  --   --   --   --   --   INR  --  1.0  --   --   --   --   --   HEPARINUNFRC  --   --   --  0.27* 0.46 0.45  --   CREATININE 1.22 1.10  --   --   --  1.16  --   TROPONINIHS  --  9  --  7  --   --   --     Estimated Creatinine Clearance: 46.3 mL/min (by C-G formula based on SCr of 1.16 mg/dL).   Medical History: Past Medical History:  Diagnosis Date   BPH (benign prostatic hypertrophy)    Colonic polyp 2011   Dr.John Hayes   Fasting hyperglycemia    Hiatal hernia 2012   Hyperlipidemia    Interstitial cystitis    presentation in 1997 w/ abdominal pain/distention, Dr. Eudelia Bunch, [facility]   Nephrolithiasis    x 2   Skin cancer    basal cell, Dr.Dan Yetta Barre    Medications:  No oral anticoagulation PTA Infusions:   heparin 1,450 Units/hr (12/26/23  1800)     Assessment: 89 yr male presents to ED on 2/13 with swelling of left leg. Reported left knee injury 6 months ago. CTAngio with pulmonary embolism; no evidence of RHS Doppler: acute DVT of left femoral vein, left popliteal vein, left posterior tibial veins, and left peroneal veins.  Patient was started on heparin infusion. Now to transition to Xarelto.   Plan:  -Stop heparin infusion with first dose of Xarelto -Start Xarelto 15 mg BID x 21 days followed by 20 mg daily -Pharmacy to provide education prior to discharge  Pricilla Riffle, PharmD, BCPS Clinical Pharmacist 12/27/2023 10:41 AM

## 2023-12-27 NOTE — Assessment & Plan Note (Signed)
-   presented with DOE - CTA consistent with acute B/L PE - no right heart strain on echo; EF normal - s/p heparin drip on admission; transition to Xarelto at discharge - follow up with Dr. Bertis Ruddy to discuss length of treatment
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
## 2023-12-27 NOTE — Assessment & Plan Note (Signed)
-   Continue Micardis HCT

## 2023-12-27 NOTE — Assessment & Plan Note (Signed)
-   Lower extremity duplex revealed acute left DVT involving left femoral, popliteal, posterior tibial, and peroneal veins. - continue Xarelto

## 2023-12-27 NOTE — Care Management Obs Status (Signed)
 MEDICARE OBSERVATION STATUS NOTIFICATION   Patient Details  Name: KAHLIL COWANS MRN: [id number] Date of Birth: [birth year]   Medicare Observation Status Notification Given:  Hart Robinsons, LCSW 12/27/2023, 12:34 PM

## 2023-12-29 ENCOUNTER — Telehealth: Payer: Self-pay

## 2023-12-29 ENCOUNTER — Encounter: Payer: Self-pay | Admitting: Internal Medicine

## 2023-12-29 NOTE — Telephone Encounter (Signed)
 Wife called back and scheduled appt for 2/20 at 11 am. They are aware of appt date/time.
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
## 2023-12-29 NOTE — Telephone Encounter (Signed)
 Called and left a message asking for a call back to the office. Offering appt with Dr. Bertis Ruddy.

## 2023-12-29 NOTE — Patient Instructions (Addendum)
       Medications changes include :   None    A referral was ordered and someone will call you to schedule an appointment.     Return in about 6 months (around 06/28/2024) for Physical Exam  - cancel 2/27 appt.

## 2023-12-30 ENCOUNTER — Ambulatory Visit: Payer: Medicare Other | Admitting: Internal Medicine

## 2023-12-30 VITALS — BP 110/68 | HR 75 | Temp 98.0°F | Ht 70.0 in | Wt 178.0 lb

## 2023-12-30 DIAGNOSIS — R0609 Other forms of dyspnea: Secondary | ICD-10-CM | POA: Diagnosis not present

## 2023-12-30 DIAGNOSIS — I824Y2 Acute embolism and thrombosis of unspecified deep veins of left proximal lower extremity: Secondary | ICD-10-CM

## 2023-12-30 DIAGNOSIS — K219 Gastro-esophageal reflux disease without esophagitis: Secondary | ICD-10-CM

## 2023-12-30 DIAGNOSIS — I10 Essential (primary) hypertension: Secondary | ICD-10-CM

## 2023-12-30 DIAGNOSIS — I2699 Other pulmonary embolism without acute cor pulmonale: Secondary | ICD-10-CM

## 2023-12-30 NOTE — Assessment & Plan Note (Signed)
 Recently started having DOE which is new - also noted LLE edema Evaluated here and d-dimer was positive - referred to ED Dx with b/l PE with moderate embolic burden , no r heart strain Pos LLE DVT Reassured DOE will improved over the next several days - weeks
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
## 2023-12-30 NOTE — Assessment & Plan Note (Signed)
 Chronic Blood pressure well controlled Continue amlodipine 5 mg daily, telmisartan-HCTZ 40-12.5 mg daily

## 2023-12-30 NOTE — Assessment & Plan Note (Signed)
 Chronic With moderate hiatal hernia on Ct scan 12/2023 GERD controlled Continue Pepcid 20 mg daily
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
## 2024-01-01 ENCOUNTER — Inpatient Hospital Stay: Payer: Medicare Other | Attending: Hematology and Oncology | Admitting: Hematology and Oncology

## 2024-01-01 ENCOUNTER — Encounter: Payer: Self-pay | Admitting: Hematology and Oncology

## 2024-01-01 VITALS — BP 151/58 | HR 68 | Temp 98.3°F | Resp 18 | Ht 70.0 in | Wt 178.4 lb

## 2024-01-01 DIAGNOSIS — M7989 Other specified soft tissue disorders: Secondary | ICD-10-CM | POA: Diagnosis not present

## 2024-01-01 DIAGNOSIS — D471 Chronic myeloproliferative disease: Secondary | ICD-10-CM | POA: Diagnosis not present

## 2024-01-01 DIAGNOSIS — I2699 Other pulmonary embolism without acute cor pulmonale: Secondary | ICD-10-CM | POA: Diagnosis not present

## 2024-01-01 DIAGNOSIS — Z7901 Long term (current) use of anticoagulants: Secondary | ICD-10-CM | POA: Diagnosis not present
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
## 2024-01-01 NOTE — Assessment & Plan Note (Signed)
 I have reviewed his imaging studies I suspect the cause of his PE is due to dehydration We discussed duration of anticoagulation therapy We discussed importance of increasing oral fluid intake with a goal of at least 90 ounces of liquid, potentially more if the patient is active with exercise I also recommend elastic compression hose to reduce leg swelling We discussed signs and symptoms to watch out for bleeding I will see him in July as scheduled

## 2024-01-01 NOTE — Assessment & Plan Note (Signed)
 He will continue current dose of hydroxyurea as directed He has appointment to see his primary care doctor in the spring and I recommend he has his CBC rechecked then

## 2024-01-07 NOTE — Progress Notes (Signed)
 Subjective:    Patient ID: Mark Copeland, male    DOB: 07/25/1934, 89 y.o.   MRN: [id number]     HPI Mark Copeland is here for a physical exam and his chronic medical problems.  He is feeling good. No concerns.    Medications and allergies reviewed with patient and updated if appropriate.  Current Outpatient Medications on File Prior to Visit  Medication Sig Dispense Refill   amLODipine (NORVASC) 5 MG tablet TAKE 1 TABLET BY MOUTH DAILY 100 tablet 2   famotidine (PEPCID) 20 MG tablet TAKE 1 TABLET BY MOUTH AT  BEDTIME (Patient taking differently: Take 20 mg by mouth daily as needed for heartburn or indigestion.) 100 tablet 2   fexofenadine (ALLEGRA) 180 MG tablet Take 180 mg by mouth daily as needed (allergies).     fluticasone (FLONASE) 50 MCG/ACT nasal spray Place 1 spray into the nose daily.     Hydrocortisone-Iodoquinol 1-1 % CREA Apply 1 application  topically daily as needed (rash/itching).     hydroxyurea (HYDREA) 500 MG capsule Take 1 capsule (500mg ) on Mondays, Wednesdays and Fridays; take 2 capsules (1000mg ) the rest of the week 180 capsule 1   pentosan polysulfate (ELMIRON) 100 MG capsule Take 1 capsule (100 mg total) by mouth 3 (three) times daily before meals. (Patient taking differently: Take 100 mg by mouth in the morning and at bedtime.) 90 capsule 0   rivaroxaban (XARELTO) 20 MG TABS tablet Take 1 tablet (20 mg total) by mouth daily with supper. Start taking after finishing the starter pack 30 tablet 5   RIVAROXABAN (XARELTO) VTE STARTER PACK (15 & 20 MG) Follow package directions: Take one 15mg  tablet by mouth twice a day. On day 22, switch to one 20mg  tablet once a day. Take with food. 51 each 0   sertraline (ZOLOFT) 25 MG tablet Take 25 mg by mouth daily.     telmisartan-hydrochlorothiazide (MICARDIS HCT) 40-12.5 MG tablet TAKE 1 TABLET BY MOUTH DAILY 90 tablet 3   No current facility-administered medications on file prior to visit.    Review of Systems   Constitutional:  Negative for fever.  HENT:  Positive for tinnitus (occ).   Eyes:  Negative for visual disturbance.  Respiratory:  Positive for shortness of breath (mild - improved). Negative for cough and wheezing.   Cardiovascular:  Positive for leg swelling (minimal in left leg - improved - almost gone). Negative for chest pain and palpitations.  Gastrointestinal:  Negative for abdominal pain, blood in stool, constipation and diarrhea.       No gerd  Genitourinary:  Positive for frequency (drinking lots of water).  Musculoskeletal:  Positive for arthralgias (left knee). Negative for back pain.  Neurological:  Negative for light-headedness and headaches.  Psychiatric/Behavioral:  Negative for dysphoric mood. The patient is not nervous/anxious.        Objective:   Vitals:   01/08/24 1109  BP: 110/60  Pulse: (!) 55  Temp: (!) 97.5 F (36.4 C)  SpO2: 98%   Filed Weights   01/08/24 1109  Weight: 175 lb 12.8 oz (79.7 kg)   Body mass index is 25.22 kg/m.  BP Readings from Last 3 Encounters:  01/08/24 110/60  01/01/24 (!) 151/58  12/30/23 110/68    Wt Readings from Last 3 Encounters:  01/08/24 175 lb 12.8 oz (79.7 kg)  01/01/24 178 lb 6.4 oz (80.9 kg)  12/30/23 178 lb (80.7 kg)      Physical Exam Constitutional: He appears well-developed and  well-nourished. No distress.  HENT:  Head: Normocephalic and atraumatic.  Right Ear: External ear normal.  Left Ear: External ear normal.  Normal ear canals and TM b/l  Mouth/Throat: Oropharynx is clear and moist. Eyes: Conjunctivae and EOM are normal.  Neck: Neck supple. No tracheal deviation present. No thyromegaly present.  No carotid bruit  Cardiovascular: Normal rate, regular rhythm, normal heart sounds and intact distal pulses.   No murmur heard.  No lower extremity edema. Pulmonary/Chest: Effort normal and breath sounds normal. No respiratory distress. He has no wheezes. He has no rales.  Abdominal: Soft. He exhibits  no distension. There is no tenderness.  Genitourinary: deferred  Lymphadenopathy:   He has no cervical adenopathy.  Skin: Skin is warm and dry. He is not diaphoretic.  Psychiatric: He has a normal mood and affect. His behavior is normal.         Assessment & Plan:   Physical exam: Screening blood work  ordered Exercise   walking Weight  normal  Substance abuse   none   Reviewed recommended immunizations.   Health Maintenance  Topic Date Due   COVID-19 Vaccine (6 - 2024-25 season) 07/13/2023   Medicare Annual Wellness (AWV)  04/10/2024   DTaP/Tdap/Td (4 - Td or Tdap) 05/11/2031   Pneumonia Vaccine 74+ Years old  Completed   INFLUENZA VACCINE  Completed   Zoster Vaccines- Shingrix  Completed   HPV VACCINES  Aged Out     See Problem List for Assessment and Plan of chronic medical problems.

## 2024-01-07 NOTE — Patient Instructions (Addendum)
 Blood work was ordered.   Have this done in a couple of months.      Medications changes include :   None     Return in about 6 months (around 07/07/2024) for follow up.   Health Maintenance, Male Adopting a healthy lifestyle and getting preventive care are important in promoting health and wellness. Ask your health care provider about: The right schedule for you to have regular tests and exams. Things you can do on your own to prevent diseases and keep yourself healthy. What should I know about diet, weight, and exercise? Eat a healthy diet  Eat a diet that includes plenty of vegetables, fruits, low-fat dairy products, and lean protein. Do not eat a lot of foods that are high in solid fats, added sugars, or sodium. Maintain a healthy weight Body mass index (BMI) is a measurement that can be used to identify possible weight problems. It estimates body fat based on height and weight. Your health care provider can help determine your BMI and help you achieve or maintain a healthy weight. Get regular exercise Get regular exercise. This is one of the most important things you can do for your health. Most adults should: Exercise for at least 150 minutes each week. The exercise should increase your heart rate and make you sweat (moderate-intensity exercise). Do strengthening exercises at least twice a week. This is in addition to the moderate-intensity exercise. Spend less time sitting. Even light physical activity can be beneficial. Watch cholesterol and blood lipids Have your blood tested for lipids and cholesterol at 88 years of age, then have this test every 5 years. You may need to have your cholesterol levels checked more often if: Your lipid or cholesterol levels are high. You are older than 88 years of age. You are at high risk for heart disease. What should I know about cancer screening? Many types of cancers can be detected early and may often be prevented. Depending  on your health history and family history, you may need to have cancer screening at various ages. This may include screening for: Colorectal cancer. Prostate cancer. Skin cancer. Lung cancer. What should I know about heart disease, diabetes, and high blood pressure? Blood pressure and heart disease High blood pressure causes heart disease and increases the risk of stroke. This is more likely to develop in people who have high blood pressure readings or are overweight. Talk with your health care provider about your target blood pressure readings. Have your blood pressure checked: Every 3-5 years if you are 68-60 years of age. Every year if you are 63 years old or older. If you are between the ages of 66 and 72 and are a current or former smoker, ask your health care provider if you should have a one-time screening for abdominal aortic aneurysm (AAA). Diabetes Have regular diabetes screenings. This checks your fasting blood sugar level. Have the screening done: Once every three years after age 56 if you are at a normal weight and have a low risk for diabetes. More often and at a younger age if you are overweight or have a high risk for diabetes. What should I know about preventing infection? Hepatitis B If you have a higher risk for hepatitis B, you should be screened for this virus. Talk with your health care provider to find out if you are at risk for hepatitis B infection. Hepatitis C Blood testing is recommended for: Everyone born from [birth year] through 1965. Anyone  with known risk factors for hepatitis C. Sexually transmitted infections (STIs) You should be screened each year for STIs, including gonorrhea and chlamydia, if: You are sexually active and are younger than 88 years of age. You are older than 88 years of age and your health care provider tells you that you are at risk for this type of infection. Your sexual activity has changed since you were last screened, and you are at  increased risk for chlamydia or gonorrhea. Ask your health care provider if you are at risk. Ask your health care provider about whether you are at high risk for HIV. Your health care provider may recommend a prescription medicine to help prevent HIV infection. If you choose to take medicine to prevent HIV, you should first get tested for HIV. You should then be tested every 3 months for as long as you are taking the medicine. Follow these instructions at home: Alcohol use Do not drink alcohol if your health care provider tells you not to drink. If you drink alcohol: Limit how much you have to 0-2 drinks a day. Know how much alcohol is in your drink. In the [place] one drink equals one 12 oz bottle of beer (355 mL), one 5 oz glass of wine (148 mL), or one 1 oz glass of hard liquor (44 mL). Lifestyle Do not use any products that contain nicotine or tobacco. These products include cigarettes, chewing tobacco, and vaping devices, such as e-cigarettes. If you need help quitting, ask your health care provider. Do not use street drugs. Do not share needles. Ask your health care provider for help if you need support or information about quitting drugs. General instructions Schedule regular health, dental, and eye exams. Stay current with your vaccines. Tell your health care provider if: You often feel depressed. You have ever been abused or do not feel safe at home. Summary Adopting a healthy lifestyle and getting preventive care are important in promoting health and wellness. Follow your health care provider's instructions about healthy diet, exercising, and getting tested or screened for diseases. Follow your health care provider's instructions on monitoring your cholesterol and blood pressure. This information is not intended to replace advice given to you by your health care provider. Make sure you discuss any questions you have with your health care provider. Document Revised: 03/19/2021  Document Reviewed: 03/19/2021 Elsevier Patient Education  2024 [employer].

## 2024-01-08 ENCOUNTER — Ambulatory Visit (INDEPENDENT_AMBULATORY_CARE_PROVIDER_SITE_OTHER): Payer: Medicare Other | Admitting: Internal Medicine

## 2024-01-08 ENCOUNTER — Encounter: Payer: Self-pay | Admitting: Internal Medicine

## 2024-01-08 VITALS — BP 110/60 | HR 55 | Temp 97.5°F | Ht 70.0 in | Wt 175.8 lb

## 2024-01-08 DIAGNOSIS — F32A Depression, unspecified: Secondary | ICD-10-CM

## 2024-01-08 DIAGNOSIS — R7303 Prediabetes: Secondary | ICD-10-CM

## 2024-01-08 DIAGNOSIS — N3010 Interstitial cystitis (chronic) without hematuria: Secondary | ICD-10-CM | POA: Diagnosis not present

## 2024-01-08 DIAGNOSIS — F419 Anxiety disorder, unspecified: Secondary | ICD-10-CM

## 2024-01-08 DIAGNOSIS — K219 Gastro-esophageal reflux disease without esophagitis: Secondary | ICD-10-CM

## 2024-01-08 DIAGNOSIS — Z0000 Encounter for general adult medical examination without abnormal findings: Secondary | ICD-10-CM

## 2024-01-08 DIAGNOSIS — E782 Mixed hyperlipidemia: Secondary | ICD-10-CM

## 2024-01-08 DIAGNOSIS — F4323 Adjustment disorder with mixed anxiety and depressed mood: Secondary | ICD-10-CM

## 2024-01-08 DIAGNOSIS — I10 Essential (primary) hypertension: Secondary | ICD-10-CM | POA: Diagnosis not present

## 2024-01-08 NOTE — Assessment & Plan Note (Signed)
 Chronic To see urology soon Taking elmiron as needed

## 2024-01-11 NOTE — Assessment & Plan Note (Signed)
 Chronic Blood pressure well controlled Cmp, cbc Continue amlodipine 5 mg daily, telmisartan-HCTZ 40-12.5 mg daily

## 2024-01-11 NOTE — Assessment & Plan Note (Signed)
 Chronic Lab Results  Component Value Date   HGBA1C 5.7 12/10/2022   Check A1c Continue exercise, healthy diet

## 2024-01-11 NOTE — Assessment & Plan Note (Signed)
Chronic Controlled, Stable Taking sertraline 25 mg daily as needed

## 2024-01-11 NOTE — Assessment & Plan Note (Signed)
Chronic Regular exercise and healthy diet encouraged Check lipid panel  Continue diet control 

## 2024-01-11 NOTE — Assessment & Plan Note (Signed)
Chronic Controlled, stable Continue sertraline 25 mg daily  

## 2024-01-11 NOTE — Assessment & Plan Note (Signed)
 Chronic With moderate hiatal hernia on Ct scan 12/2023 GERD controlled Continue Pepcid 20 mg daily

## 2024-01-27 DIAGNOSIS — M25562 Pain in left knee: Secondary | ICD-10-CM | POA: Diagnosis not present

## 2024-02-09 ENCOUNTER — Other Ambulatory Visit (INDEPENDENT_AMBULATORY_CARE_PROVIDER_SITE_OTHER)

## 2024-02-09 DIAGNOSIS — R7303 Prediabetes: Secondary | ICD-10-CM

## 2024-02-09 DIAGNOSIS — N3010 Interstitial cystitis (chronic) without hematuria: Secondary | ICD-10-CM | POA: Diagnosis not present

## 2024-02-09 DIAGNOSIS — I10 Essential (primary) hypertension: Secondary | ICD-10-CM

## 2024-02-09 DIAGNOSIS — N320 Bladder-neck obstruction: Secondary | ICD-10-CM | POA: Diagnosis not present

## 2024-02-09 LAB — CBC WITH DIFFERENTIAL/PLATELET
Basophils Absolute: 0.1 10*3/uL (ref 0.0–0.1)
Basophils Relative: 1 % (ref 0.0–3.0)
Eosinophils Absolute: 0.1 10*3/uL (ref 0.0–0.7)
Eosinophils Relative: 1.2 % (ref 0.0–5.0)
HCT: 42.4 % (ref 39.0–52.0)
Hemoglobin: 14.2 g/dL (ref 13.0–17.0)
Lymphocytes Relative: 25.1 % (ref 12.0–46.0)
Lymphs Abs: 2.5 10*3/uL (ref 0.7–4.0)
MCHC: 33.5 g/dL (ref 30.0–36.0)
MCV: 116.1 fl — ABNORMAL HIGH (ref 78.0–100.0)
Monocytes Absolute: 0.8 10*3/uL (ref 0.1–1.0)
Monocytes Relative: 7.7 % (ref 3.0–12.0)
Neutro Abs: 6.5 10*3/uL (ref 1.4–7.7)
Neutrophils Relative %: 65 % (ref 43.0–77.0)
Platelets: 430 10*3/uL — ABNORMAL HIGH (ref 150.0–400.0)
RBC: 3.65 Mil/uL — ABNORMAL LOW (ref 4.22–5.81)
RDW: 14.3 % (ref 11.5–15.5)
WBC: 10 10*3/uL (ref 4.0–10.5)

## 2024-02-09 LAB — COMPREHENSIVE METABOLIC PANEL WITH GFR
ALT: 19 U/L (ref 0–53)
AST: 19 U/L (ref 0–37)
Albumin: 4.7 g/dL (ref 3.5–5.2)
Alkaline Phosphatase: 61 U/L (ref 39–117)
BUN: 22 mg/dL (ref 6–23)
CO2: 28 meq/L (ref 19–32)
Calcium: 10 mg/dL (ref 8.4–10.5)
Chloride: 104 meq/L (ref 96–112)
Creatinine, Ser: 1.17 mg/dL (ref 0.40–1.50)
GFR: 55.95 mL/min — ABNORMAL LOW (ref >60.00)
Glucose, Bld: 108 mg/dL — ABNORMAL HIGH (ref 70–99)
Potassium: 4.8 meq/L (ref 3.5–5.1)
Sodium: 140 meq/L (ref 135–145)
Total Bilirubin: 0.6 mg/dL (ref 0.2–1.2)
Total Protein: 7.1 g/dL (ref 6.0–8.3)

## 2024-02-09 LAB — HEMOGLOBIN A1C: Hgb A1c MFr Bld: 5.8 % (ref 4.6–6.5)

## 2024-02-10 ENCOUNTER — Telehealth: Payer: Self-pay

## 2024-02-10 ENCOUNTER — Encounter: Payer: Self-pay | Admitting: Internal Medicine

## 2024-02-10 NOTE — Telephone Encounter (Signed)
 Called pt and spoke with him extensively regarding his reluctance for Xarelto.  He states, "it makes me feel bad. I just dont feel good" when asked to elaborate, he reports tinnitus, dizziness, feeling unsteady, abd discomfort, urinary urgency, and oliguria. He states these symptoms only last for a couple of hours after taking Xarleto then it stops. Mark Copeland also states this did not happen with the Xarelto starter pack, only when he switched to 20 mg. He would like to go back to the starter pack or halve his 20 mg dose. Advised pt per MD, the pill cannot be halved and he states, "well then I'm not taking it anymore until I see her because I can't live like this, I feel like hell right now." Pt was educated on risk of not taking Xarelto and he states he is willing to take the risk of not taking it. He was offered appt with MD  Friday, April 4th and he accepted 0820 appt. He knows to arrive 15 min prior for check in process.

## 2024-02-10 NOTE — Telephone Encounter (Signed)
 Attempted to call pt per MD to advise about Xarelto. LVM for call back on mobile device. Unable to reach via landline; no VM available on that line.

## 2024-02-10 NOTE — Telephone Encounter (Signed)
-----   Message from [name] sent at 02/10/2024 10:05 AM EDT ----- Please call him It is not approriate to change xarelto to 10 mg BID If he does not understand I can see him on Thursday or Friday to discuss

## 2024-02-13 ENCOUNTER — Encounter: Payer: Self-pay | Admitting: Hematology and Oncology

## 2024-02-13 ENCOUNTER — Inpatient Hospital Stay: Attending: Hematology and Oncology | Admitting: Hematology and Oncology

## 2024-02-13 VITALS — BP 152/84 | HR 60 | Temp 97.8°F | Resp 18 | Ht 70.0 in | Wt 175.8 lb

## 2024-02-13 DIAGNOSIS — I2699 Other pulmonary embolism without acute cor pulmonale: Secondary | ICD-10-CM | POA: Diagnosis not present

## 2024-02-13 DIAGNOSIS — I10 Essential (primary) hypertension: Secondary | ICD-10-CM | POA: Diagnosis not present

## 2024-02-13 DIAGNOSIS — R030 Elevated blood-pressure reading, without diagnosis of hypertension: Secondary | ICD-10-CM | POA: Insufficient documentation

## 2024-02-13 DIAGNOSIS — N4889 Other specified disorders of penis: Secondary | ICD-10-CM | POA: Diagnosis not present

## 2024-02-13 MED ORDER — APIXABAN 5 MG PO TABS: 5.0000 mg | ORAL_TABLET | Freq: Two times a day (BID) | ORAL | 2 refills | Status: DC

## 2024-02-13 NOTE — Assessment & Plan Note (Addendum)
 Patient was surprised by elevated blood pressure noted on exam today His blood pressure is typically within normal range It is possible that the patient is bothered by recent poor sleep and penile pain, with component of whitecoat hypertension causing his blood pressure to be elevated today I recommend the patient to check his blood pressure at home I noted that he has a cardiologist appointment coming up I am positive that his blood pressure will improve at home

## 2024-02-13 NOTE — Assessment & Plan Note (Addendum)
 He has frequent urination and was placed on medication by urologist who then tell him that he probably does not need it He also have intermittent penile pain but had normal exam per his urologist I told the patient this is unlikely to be caused by Xarelto It is possible that when I told the patient to increase oral fluid intake, it might exacerbate his problem with BPH We discussed reduced oral intake moving forward to see if his symptoms might be alleviated

## 2024-02-13 NOTE — Assessment & Plan Note (Addendum)
 The patient is attributing some of his symptoms with changes on the dose of Xarelto I told the patient they are unlikely to be related Since he is not tolerating current prescription well We discussed risk and benefits of changing his prescription to Eliquis and he is in agreement

## 2024-02-13 NOTE — Progress Notes (Signed)
 [facility] OFFICE PROGRESS NOTE  Patient Care Team: Pincus Sanes, MD as PCP - General (Internal Medicine) O'Neal, Ronnald Ramp, MD as PCP - Cardiology (Cardiology) Dorena Cookey, MD (Inactive) as Consulting Physician (Gastroenterology) Kathyrn Sheriff, [facility] (Inactive) as Pharmacist (Pharmacist) Sallye Lat, MD as Consulting Physician (Ophthalmology)  Assessment & Plan Acute pulmonary embolism without acute cor pulmonale, unspecified pulmonary embolism type [facility]) The patient is attributing some of his symptoms with changes on the dose of Xarelto I told the patient they are unlikely to be related Since he is not tolerating current prescription well We discussed risk and benefits of changing his prescription to Eliquis and he is in agreement Penile pain He has frequent urination and was placed on medication by urologist who then tell him that he probably does not need it He also have intermittent penile pain but had normal exam per his urologist I told the patient this is unlikely to be caused by Xarelto It is possible that when I told the patient to increase oral fluid intake, it might exacerbate his problem with BPH We discussed reduced oral intake moving forward to see if his symptoms might be alleviated White coat syndrome with hypertension Patient was surprised by elevated blood pressure noted on exam today His blood pressure is typically within normal range It is possible that the patient is bothered by recent poor sleep and penile pain, with component of whitecoat hypertension causing his blood pressure to be elevated today I recommend the patient to check his blood pressure at home I noted that he has a cardiologist appointment coming up I am positive that his blood pressure will improve at home  No orders of the defined types were placed in this encounter.  Spent 30 minutes with the patient today [name], MD  INTERVAL HISTORY: he returns for  somewhat urgent evaluation When his anticoagulation therapy was transitioned from Xarelto 15 mg twice daily to 20 mg once daily, he noted significant changes to his sleep Is having frequent urination at night and penile pain when he urinates In addition to being placed on anticoagulation therapy, I also encouraged him to increase oral fluid intake from the last visit to approximately 55 ounces per day I suspect this is likely the cause of his urination problem and not from Xarelto However, given his difficulties tolerating 20 mg daily, we discussed risk and benefits of changing his anticoagulation therapy to Eliquis He is also very bothered by elevated blood pressure in the office today  PHYSICAL EXAMINATION: ECOG PERFORMANCE STATUS: 1 - Symptomatic but completely ambulatory  Vitals:   02/13/24 0829  BP: (!) 152/84  Pulse: 60  Resp: 18  Temp: 97.8 F (36.6 C)  SpO2: 100%   Filed Weights   02/13/24 0829  Weight: 175 lb 12.8 oz (79.7 kg)
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
## 2024-02-25 ENCOUNTER — Ambulatory Visit: Payer: Medicare Other | Admitting: Cardiovascular Disease

## 2024-03-10 ENCOUNTER — Telehealth: Payer: Self-pay

## 2024-03-10 NOTE — Telephone Encounter (Signed)
 Received call from Mr Daughdrill family requesting a call back at [phone]. Attempted to call, no answer; LVM for call back.

## 2024-04-07 ENCOUNTER — Ambulatory Visit (INDEPENDENT_AMBULATORY_CARE_PROVIDER_SITE_OTHER)

## 2024-04-07 VITALS — Ht 70.0 in | Wt 175.0 lb

## 2024-04-07 DIAGNOSIS — Z0000 Encounter for general adult medical examination without abnormal findings: Secondary | ICD-10-CM | POA: Diagnosis not present

## 2024-04-07 NOTE — Patient Instructions (Signed)
 Mr. Mark Copeland , Thank you for taking time out of your busy schedule to complete your Annual Wellness Visit with me. I enjoyed our conversation and look forward to speaking with you again next year. I, as well as your care team,  appreciate your ongoing commitment to your health goals. Please review the following plan we discussed and let me know if I can assist you in the future. Your Game plan/ To Do List   Follow up Visits: Next Medicare AWV with our clinical staff: 04/11/2025.   Have you seen your provider in the last 6 months (3 months if uncontrolled diabetes)? Yes Next Office Visit with your provider: 04/14/2025.  Clinician Recommendations:  Aim for 30 minutes of exercise or brisk walking, 6-8 glasses of water, and 5 servings of fruits and vegetables each day. Keep up the good work.      This is a list of the screening recommended for you and due dates:  Health Maintenance  Topic Date Due   COVID-19 Vaccine (6 - 2024-25 season) 07/13/2023   Flu Shot  06/11/2024   Medicare Annual Wellness Visit  04/07/2025   DTaP/Tdap/Td vaccine (4 - Td or Tdap) 05/11/2031   Pneumonia Vaccine  Completed   Zoster (Shingles) Vaccine  Completed   HPV Vaccine  Aged Out   Meningitis B Vaccine  Aged Out    Advanced directives: (Copy Requested) Please bring a copy of your health care power of attorney and living will to the office to be added to your chart at your convenience. You can mail to [facility] [address] [place], [place] [address] or email to [email or web address] Advance Care Planning is important because it:  [x]  Makes sure you receive the medical care that is consistent with your values, goals, and preferences  [x]  It provides guidance to your family and loved ones and reduces their decisional burden about whether or not they are making the right decisions based on your wishes.  Follow the link provided in your after visit summary or read over the paperwork we have  mailed to you to help you started getting your Advance Directives in place. If you need assistance in completing these, please reach out to us  so that we can help you!  See attachments for Preventive Care and Fall Prevention Tips.

## 2024-04-07 NOTE — Progress Notes (Signed)
 Subjective:   Mark Copeland is a 89 y.o. who presents for a Medicare Wellness preventive visit.  As a reminder, Annual Wellness Visits don't include a physical exam, and some assessments may be limited, especially if this visit is performed virtually. We may recommend an in-person follow-up visit with your provider if needed.  Visit Complete: Virtual I connected with  Mark Copeland on 04/07/24 by a audio enabled telemedicine application and verified that I am speaking with the correct person using two identifiers.  Patient Location: Home  Provider Location: Home Office  I discussed the limitations of evaluation and management by telemedicine. The patient expressed understanding and agreed to proceed.  Vital Signs: Because this visit was a virtual/telehealth visit, some criteria may be missing or patient reported. Any vitals not documented were not able to be obtained and vitals that have been documented are patient reported.  VideoDeclined- This patient declined Librarian, academic. Therefore the visit was completed with audio only.  Persons Participating in Visit: Patient.  AWV Questionnaire: No: Patient Medicare AWV questionnaire was not completed prior to this visit.  Cardiac Risk Factors include: advanced age (>59men, >19 women);sedentary lifestyle;male gender;hypertension     Objective:     Today's Vitals   04/07/24 1341  Weight: 175 lb (79.4 kg)  Height: 5\' 10"  (1.778 m)   Body mass index is 25.11 kg/m.     04/07/2024    1:47 PM 12/26/2023    3:20 PM 12/25/2023   10:36 PM 04/11/2023    8:09 AM 12/24/2022   10:18 AM 04/09/2022    1:05 PM 04/06/2021   11:25 AM  Advanced Directives  Does Patient Have a Medical Advance Directive? Yes Yes Yes Yes Yes Yes Yes  Type of Estate agent of [place];Living will Healthcare Power of [place];Living will Living will;Healthcare Power of Attorney Living will;Healthcare Power of  Attorney Healthcare Power of Attorney Living will Living will;Healthcare Power of Attorney  Does patient want to make changes to medical advance directive?  No - Patient declined    No - Patient declined No - Patient declined  Copy of Healthcare Power of Attorney in Chart? No - copy requested Yes - validated most recent copy scanned in chart (See row information)     No - copy requested    Current Medications (verified) Outpatient Encounter Medications as of 04/07/2024  Medication Sig   amLODipine  (NORVASC ) 5 MG tablet TAKE 1 TABLET BY MOUTH DAILY   apixaban  (ELIQUIS ) 5 MG TABS tablet Take 1 tablet (5 mg total) by mouth 2 (two) times daily.   famotidine  (PEPCID ) 20 MG tablet TAKE 1 TABLET BY MOUTH AT  BEDTIME (Patient taking differently: Take 20 mg by mouth daily as needed for heartburn or indigestion.)   fexofenadine (ALLEGRA) 180 MG tablet Take 180 mg by mouth daily as needed (allergies).   fluticasone  (FLONASE ) 50 MCG/ACT nasal spray Place 1 spray into the nose daily.   Hydrocortisone-Iodoquinol 1-1 % CREA Apply 1 application  topically daily as needed (rash/itching).   hydroxyurea  (HYDREA ) 500 MG capsule Take 1 capsule (500mg ) on Mondays, Wednesdays and Fridays; take 2 capsules (1000mg ) the rest of the week   pentosan polysulfate (ELMIRON ) 100 MG capsule Take 1 capsule (100 mg total) by mouth 3 (three) times daily before meals. (Patient taking differently: Take 100 mg by mouth in the morning and at bedtime.)   sertraline  (ZOLOFT ) 25 MG tablet Take 25 mg by mouth daily.   telmisartan -hydrochlorothiazide  (MICARDIS  HCT)  40-12.5 MG tablet TAKE 1 TABLET BY MOUTH DAILY   XARELTO  20 MG TABS tablet Take 20 mg by mouth daily.   No facility-administered encounter medications on file as of 04/07/2024.    Allergies (verified) Nitrofurantoin, Penicillins, Sulfonamide derivatives, Amlodipine , Shellfish allergy, and Tamsulosin   History: Past Medical History:  Diagnosis Date   BPH (benign prostatic  hypertrophy)    Colonic polyp 2011   Dr.John Hayes   Fasting hyperglycemia    Hiatal hernia 2012   Hyperlipidemia    Interstitial cystitis    presentation in 1997 w/ abdominal pain/distention, Dr. Michele Ahle, [facility]   Nephrolithiasis    x 2   Skin cancer    basal cell, Dr.Dan Rochelle Chu   Past Surgical History:  Procedure Laterality Date   COLONOSCOPY W/ POLYPECTOMY     and esophageal dilation 01/2008 by Dr.Hayes; bladder stones s/p urethral dilation 10/2007 by Dr. Luster Salters   CYSTOSCOPY     2004/2011; Hospitalized 1997 w/ I.C.   LAPAROSCOPIC CHOLECYSTECTOMY  07/14/11   SEPTOPLASTY     TONSILLECTOMY     UPPER GASTROINTESTINAL ENDOSCOPY  2012   Dr Delilah Fend; hiatal hernia   Family History  Problem Relation Age of Onset   Transient ischemic attack Father 22   Hypertension Father    Coronary artery disease Father    Stroke Mother 82   Hypertension Brother    Diabetes Neg Hx    Cancer Neg Hx    Social History   Socioeconomic History   Marital status: Married    Spouse name: Perla Bradford   Number of children: 1   Years of education: Not on file   Highest education level: 12th grade  Occupational History   Occupation: RETIRED  Tobacco Use   Smoking status: Former    Current packs/day: 0.00    Types: Cigarettes    Quit date: 08/04/1960    Years since quitting: 63.7   Smokeless tobacco: Never   Tobacco comments:    smoked 1958-1961, up to 2 cigars / day  Vaping Use   Vaping status: Never Used  Substance and Sexual Activity   Alcohol use: Not Currently    Comment:  occasional wine   Drug use: No   Sexual activity: Not Currently  Other Topics Concern   Not on file  Social History Narrative   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 with wife at [employer]   Social Drivers of Health   Financial Resource Strain: Low Risk  (04/07/2024)   Overall Financial Resource Strain (CARDIA)    Difficulty of Paying Living Expenses: Not hard at all  Food Insecurity: No Food Insecurity (04/07/2024)    Hunger Vital Sign    Worried About Running Out of Food in the Last Year: Never true    Ran Out of Food in the Last Year: Never true  Transportation Needs: No Transportation Needs (04/07/2024)   PRAPARE - Administrator, Civil Service (Medical): No    Lack of Transportation (Non-Medical): No  Physical Activity: Sufficiently Active (04/07/2024)   Exercise Vital Sign    Days of Exercise per Week: 3 days    Minutes of Exercise per Session: 60 min  Stress: No Stress Concern Present (04/07/2024)   [employer] of Occupational Health - Occupational Stress Questionnaire    Feeling of Stress : Not at all  Social Connections: Socially Integrated (04/07/2024)   Social Connection and Isolation Panel [NHANES]    Frequency of Communication with Friends and Family: More than  three times a week    Frequency of Social Gatherings with Friends and Family: More than three times a week    Attends Religious Services: More than 4 times per year    Active Member of [employer] or Organizations: Yes    Attends Banker Meetings: Never    Marital Status: Married    Tobacco Counseling Counseling given: Not Answered Tobacco comments: smoked 1958-1961, up to 2 cigars / day    Clinical Intake:  Pre-visit preparation completed: Yes  Pain : No/denies pain     BMI - recorded: 25.11 Nutritional Status: BMI 25 -29 Overweight Nutritional Risks: None Diabetes: No  Lab Results  Component Value Date   HGBA1C 5.8 02/09/2024   HGBA1C 5.7 12/10/2022   HGBA1C 6.1 06/07/2022     How often do you need to have someone help you when you read instructions, pamphlets, or other written materials from your doctor or pharmacy?: 1 - Never  Interpreter Needed?: No  Information entered by :: Meeghan Skipper, RMA   Activities of Daily Living     04/07/2024    1:44 PM 12/26/2023    3:20 PM  In your present state of health, do you have any difficulty performing the following activities:   Hearing? 1 0  Vision? 1 0  Difficulty concentrating or making decisions? 1 0  Walking or climbing stairs? 1   Dressing or bathing? 1   Doing errands, shopping? 1 0  Preparing Food and eating ? Y   Using the Toilet? Y   In the past six months, have you accidently leaked urine? Y   Do you have problems with loss of bowel control? Y   Managing your Medications? Y   Managing your Finances? Y   Housekeeping or managing your Housekeeping? Y     Patient Care Team: Colene Dauphin, MD as PCP - General (Internal Medicine) O'Neal, Cathay Clonts, MD as PCP - Cardiology (Cardiology) Delilah Fend, MD (Inactive) as Consulting Physician (Gastroenterology) Jonathan Neighbor, [facility] (Inactive) as Pharmacist (Pharmacist) Devin Foerster, MD as Consulting Physician (Ophthalmology)  Indicate any recent Medical Services you may have received from other than [facility] providers in the past year (date may be approximate).     Assessment:    This is a routine wellness examination for Mark Copeland.  Hearing/Vision screen Hearing Screening - Comments:: Denies hearing difficulties   Vision Screening - Comments:: Wears eyeglasses/Dr. Candi Chafe   Goals Addressed             This Visit's Progress    Patient Stated   On track    To maintain my current health status by continuing to eat healthy, stay physically active and socially active./2005       Depression Screen     04/07/2024    1:51 PM 12/30/2023    4:27 PM 04/11/2023    8:13 AM 04/11/2023    8:09 AM 12/10/2022    8:25 AM 06/07/2022    8:16 AM 04/09/2022    1:18 PM  PHQ 2/9 Scores  PHQ - 2 Score 0 0 0 0 0 0 0  PHQ- 9 Score 0    0 0     Fall Risk     04/07/2024    1:48 PM 12/30/2023    4:27 PM 04/11/2023    8:08 AM 04/09/2023    4:20 PM 12/10/2022    8:24 AM  Fall Risk   Falls in the past year? 0 0 1  0  Number  falls in past yr: 0 0 0 0 0  Injury with Fall? 0 0 1 1 0  Risk for fall due to :  No Fall Risks Impaired balance/gait  No Fall Risks   Follow up Falls evaluation completed;Falls prevention discussed Falls evaluation completed Falls evaluation completed  Falls evaluation completed    MEDICARE RISK AT HOME:  Medicare Risk at Home Any stairs in or around the home?: No If so, are there any without handrails?: No Home free of loose throw rugs in walkways, pet beds, electrical cords, etc?: Yes Adequate lighting in your home to reduce risk of falls?: Yes Life alert?: Yes Use of a cane, walker or w/c?: No Grab bars in the bathroom?: Yes Shower chair or bench in shower?: Yes Elevated toilet seat or a handicapped toilet?: Yes  TIMED UP AND GO:  Was the test performed?  No  Cognitive Function: Declined/Normal: No cognitive concerns noted by patient or family. Patient alert, oriented, able to answer questions appropriately and recall recent events. No signs of memory loss or confusion.        04/11/2023    8:31 AM 04/09/2022    1:18 PM  6CIT Screen  What Year? 0 points 0 points  What month? 0 points 0 points  What time? 0 points 0 points  Count back from 20  0 points  Months in reverse 0 points 0 points  Repeat phrase 0 points 0 points  Total Score  0 points    Immunizations Immunization History  Administered Date(s) Administered   Fluad Quad(high Dose 65+) 08/07/2019, 08/11/2020, 08/13/2021, 07/30/2022   Influenza Whole 08/04/2008   Influenza, High Dose Seasonal PF 08/08/2016, 07/09/2017, 08/13/2018, 07/18/2023   Influenza,inj,Quad PF,6+ Mos 08/03/2015   Influenza-Unspecified 07/28/2012, 07/18/2023   PFIZER(Purple Top)SARS-COV-2 Vaccination 11/22/2019, 12/12/2019, 08/29/2020, 02/13/2021, 07/30/2021   Pneumococcal Conjugate-13 04/04/2015   Pneumococcal Polysaccharide-23 09/16/2002   Td 07/22/2000, 10/11/2010   Tdap 05/10/2021   Zoster Recombinant(Shingrix) 02/20/2018, 05/15/2018   Zoster, Live 09/24/2007    Screening Tests Health Maintenance  Topic Date Due   COVID-19 Vaccine (6 - 2024-25 season)  07/13/2023   INFLUENZA VACCINE  06/11/2024   Medicare Annual Wellness (AWV)  04/07/2025   DTaP/Tdap/Td (4 - Td or Tdap) 05/11/2031   Pneumonia Vaccine 55+ Years old  Completed   Zoster Vaccines- Shingrix  Completed   HPV VACCINES  Aged Out   Meningococcal B Vaccine  Aged Out    Health Maintenance  Health Maintenance Due  Topic Date Due   COVID-19 Vaccine (6 - 2024-25 season) 07/13/2023   Health Maintenance Items Addressed: See Nurse Notes  Additional Screening:  Vision Screening: Recommended annual ophthalmology exams for early detection of glaucoma and other disorders of the eye.  Dental Screening: Recommended annual dental exams for proper oral hygiene  Community Resource Referral / Chronic Care Management: CRR required this visit?  No   CCM required this visit?  No   Plan:    I have personally reviewed and noted the following in the patient's chart:   Medical and social history Use of alcohol, tobacco or illicit drugs  Current medications and supplements including opioid prescriptions. Patient is not currently taking opioid prescriptions. Functional ability and status Nutritional status Physical activity Advanced directives List of other physicians Hospitalizations, surgeries, and ER visits in previous 12 months Vitals Screenings to include cognitive, depression, and falls Referrals and appointments  In addition, I have reviewed and discussed with patient certain preventive protocols, quality metrics, and best practice recommendations.  A written personalized care plan for preventive services as well as general preventive health recommendations were provided to patient.   Tyrese Capriotti L Jamez Ambrocio, CMA   04/07/2024   After Visit Summary: (MyChart) Due to this being a telephonic visit, the after visit summary with patients personalized plan was offered to patient via MyChart   Notes: Please refer to Routing Comments.

## 2024-04-08 ENCOUNTER — Other Ambulatory Visit: Payer: Self-pay | Admitting: Internal Medicine

## 2024-04-13 ENCOUNTER — Encounter: Payer: Self-pay | Admitting: Internal Medicine

## 2024-04-13 ENCOUNTER — Other Ambulatory Visit: Payer: Self-pay | Admitting: Internal Medicine

## 2024-04-13 NOTE — Patient Instructions (Incomplete)
      Blood work was ordered.       Medications changes include :   None    A referral was ordered and someone will call you to schedule an appointment.     No follow-ups on file.

## 2024-04-13 NOTE — Progress Notes (Unsigned)
      Subjective:    Patient ID: Mark Copeland, male    DOB: 19-Mar-1934, 89 y.o.   MRN: [id number]     HPI Dalvin is here for follow up of his chronic medical problems.  No labs needed  Medications and allergies reviewed with patient and updated if appropriate.  Current Outpatient Medications on File Prior to Visit  Medication Sig Dispense Refill   amLODipine  (NORVASC ) 5 MG tablet TAKE 1 TABLET BY MOUTH DAILY 100 tablet 2   apixaban  (ELIQUIS ) 5 MG TABS tablet Take 1 tablet (5 mg total) by mouth 2 (two) times daily. 60 tablet 2   ELMIRON  100 MG capsule TAKE 1 CAPSULE BY MOUTH 3 TIMES  DAILY BEFORE MEALS 90 capsule 0   famotidine  (PEPCID ) 20 MG tablet TAKE 1 TABLET BY MOUTH AT  BEDTIME (Patient taking differently: Take 20 mg by mouth daily as needed for heartburn or indigestion.) 100 tablet 2   fexofenadine (ALLEGRA) 180 MG tablet Take 180 mg by mouth daily as needed (allergies).     fluticasone  (FLONASE ) 50 MCG/ACT nasal spray Place 1 spray into the nose daily.     Hydrocortisone-Iodoquinol 1-1 % CREA Apply 1 application  topically daily as needed (rash/itching).     hydroxyurea  (HYDREA ) 500 MG capsule Take 1 capsule (500mg ) on Mondays, Wednesdays and Fridays; take 2 capsules (1000mg ) the rest of the week 180 capsule 1   sertraline  (ZOLOFT ) 25 MG tablet TAKE 1 TABLET BY MOUTH DAILY 90 tablet 3   telmisartan -hydrochlorothiazide  (MICARDIS  HCT) 40-12.5 MG tablet TAKE 1 TABLET BY MOUTH DAILY 90 tablet 3   XARELTO  20 MG TABS tablet Take 20 mg by mouth daily.     No current facility-administered medications on file prior to visit.     Review of Systems     Objective:  There were no vitals filed for this visit. BP Readings from Last 3 Encounters:  02/13/24 (!) 152/84  01/08/24 110/60  01/01/24 (!) 151/58   Wt Readings from Last 3 Encounters:  04/07/24 175 lb (79.4 kg)  02/13/24 175 lb 12.8 oz (79.7 kg)  01/08/24 175 lb 12.8 oz (79.7 kg)   There is no height or weight on  file to calculate BMI.    Physical Exam     Lab Results  Component Value Date   WBC 10.0 02/09/2024   HGB 14.2 02/09/2024   HCT 42.4 02/09/2024   PLT 430.0 (H) 02/09/2024   GLUCOSE 108 (H) 02/09/2024   CHOL 156 06/07/2022   TRIG 99.0 06/07/2022   HDL 45.60 06/07/2022   LDLCALC 91 06/07/2022   ALT 19 02/09/2024   AST 19 02/09/2024   NA 140 02/09/2024   K 4.8 02/09/2024   CL 104 02/09/2024   CREATININE 1.17 02/09/2024   BUN 22 02/09/2024   CO2 28 02/09/2024   TSH 0.97 12/10/2022   PSA 1.52 04/27/2018   INR 1.0 12/26/2023   HGBA1C 5.8 02/09/2024     Assessment & Plan:    See Problem List for Assessment and Plan of chronic medical problems.
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
## 2024-04-14 ENCOUNTER — Ambulatory Visit (INDEPENDENT_AMBULATORY_CARE_PROVIDER_SITE_OTHER): Admitting: Internal Medicine

## 2024-04-14 ENCOUNTER — Telehealth: Payer: Self-pay

## 2024-04-14 VITALS — BP 126/78 | HR 76 | Temp 98.1°F | Ht 70.0 in | Wt 171.0 lb

## 2024-04-14 DIAGNOSIS — F32A Depression, unspecified: Secondary | ICD-10-CM

## 2024-04-14 DIAGNOSIS — R7303 Prediabetes: Secondary | ICD-10-CM

## 2024-04-14 DIAGNOSIS — D75839 Thrombocytosis, unspecified: Secondary | ICD-10-CM

## 2024-04-14 DIAGNOSIS — I10 Essential (primary) hypertension: Secondary | ICD-10-CM | POA: Diagnosis not present

## 2024-04-14 DIAGNOSIS — F419 Anxiety disorder, unspecified: Secondary | ICD-10-CM

## 2024-04-14 DIAGNOSIS — K219 Gastro-esophageal reflux disease without esophagitis: Secondary | ICD-10-CM | POA: Diagnosis not present

## 2024-04-14 DIAGNOSIS — N3010 Interstitial cystitis (chronic) without hematuria: Secondary | ICD-10-CM

## 2024-04-14 MED ORDER — SERTRALINE HCL 25 MG PO TABS: 25.0000 mg | ORAL_TABLET | Freq: Every day | ORAL | 3 refills | Status: AC

## 2024-04-14 MED ORDER — XARELTO 20 MG PO TABS: 20.0000 mg | ORAL_TABLET | Freq: Every day | ORAL | Status: DC

## 2024-04-14 NOTE — Assessment & Plan Note (Signed)
 Chronic Blood pressure well controlled Continue amlodipine 5 mg daily, telmisartan-HCTZ 40-12.5 mg daily

## 2024-04-14 NOTE — Telephone Encounter (Signed)
 Left message for patient to call back

## 2024-04-14 NOTE — Assessment & Plan Note (Signed)
Chronic Controlled, stable Continue sertraline 25 mg daily  

## 2024-04-14 NOTE — Assessment & Plan Note (Addendum)
 Chronic With moderate hiatal hernia on Ct scan 12/2023 GERD controlled Continue Pepcid  20 mg daily prn - has not taken this in a while

## 2024-04-14 NOTE — Assessment & Plan Note (Signed)
 Chronic Following with oncology-Dr. Marton Sleeper On hydroxyurea 

## 2024-04-14 NOTE — Telephone Encounter (Signed)
 Called patient and let him know his appointment will be canceled.

## 2024-04-14 NOTE — Telephone Encounter (Signed)
-----   Message from Janelle Mediate sent at 04/13/2024  5:29 PM EDT -----  ----- Message ----- From: Wellington Half, FNP Sent: 04/13/2024   4:43 PM EDT To: Loyde Rule, MD  I think this can be cancelled ----- Message ----- From: Loyde Rule, MD Sent: 04/13/2024   3:53 PM EDT To: Wellington Half, FNP; #  Does this patient really need cardiology evaluation found to have significant PE

## 2024-04-14 NOTE — Assessment & Plan Note (Signed)
 Chronic Lab Results  Component Value Date   HGBA1C 5.8 02/09/2024    Continue exercise, healthy diet

## 2024-04-14 NOTE — Assessment & Plan Note (Addendum)
 Chronic Following with urology

## 2024-04-19 DIAGNOSIS — H353131 Nonexudative age-related macular degeneration, bilateral, early dry stage: Secondary | ICD-10-CM | POA: Diagnosis not present

## 2024-04-19 DIAGNOSIS — H2513 Age-related nuclear cataract, bilateral: Secondary | ICD-10-CM | POA: Diagnosis not present

## 2024-04-19 DIAGNOSIS — H0102B Squamous blepharitis left eye, upper and lower eyelids: Secondary | ICD-10-CM | POA: Diagnosis not present

## 2024-04-19 DIAGNOSIS — H0102A Squamous blepharitis right eye, upper and lower eyelids: Secondary | ICD-10-CM | POA: Diagnosis not present

## 2024-04-19 DIAGNOSIS — H43813 Vitreous degeneration, bilateral: Secondary | ICD-10-CM | POA: Diagnosis not present

## 2024-04-22 ENCOUNTER — Ambulatory Visit
Admission: RE | Admit: 2024-04-22 | Discharge: 2024-04-22 | Disposition: A | Discharge status: Home / Self Care | Source: Ambulatory Visit | Attending: Nurse Practitioner | Admitting: Nurse Practitioner

## 2024-04-22 VITALS — BP 153/75 | HR 70 | Temp 98.0°F | Resp 17

## 2024-04-22 DIAGNOSIS — J069 Acute upper respiratory infection, unspecified: Secondary | ICD-10-CM | POA: Diagnosis not present

## 2024-04-22 MED ORDER — BENZONATATE 100 MG PO CAPS
100.0000 mg | ORAL_CAPSULE | Freq: Three times a day (TID) | ORAL | 0 refills | Status: DC | PRN
Start: 2024-04-22 — End: 2024-05-11

## 2024-04-22 NOTE — ED Triage Notes (Signed)
 Pt c/o cough and chest congestion x 4 days. Denies fever. Decongestant and robitussin prn.

## 2024-04-22 NOTE — ED Provider Notes (Signed)
 [facility]    CSN: [id number] Arrival date & time: 04/22/24  0950      History   Chief Complaint Chief Complaint  Patient presents with   Cough    chest congestion     HPI Mark Copeland is a 89 y.o. male.   Patient presents today to urgent care for 4 day history of dry cough with some productive coughing that has begun in the past 24 hours after taking Robitussin.  He endorses chest congestion, runny and stuffy nose, decreased appetite, and fatigue as well since symptoms began.  No fever, body aches, chills, shortness of breath or chest pain/pressure.  No sore throat, headache, ear pain, abdominal pain, nausea/vomiting, or diarrhea.  He has taking Robitussin and a decongestant with minimal improvement.  No known sick contacts.     Past Medical History:  Diagnosis Date   BPH (benign prostatic hypertrophy)    Colonic polyp 2011   Dr.John Hayes   Fasting hyperglycemia    Hiatal hernia 2012   Hyperlipidemia    Interstitial cystitis    presentation in 1997 w/ abdominal pain/distention, Dr. Michele Ahle, [facility]   Nephrolithiasis    x 2   Skin cancer    basal cell, Dr.Dan Rochelle Chu    Patient Active Problem List   Diagnosis Date Noted   Penile pain 02/13/2024   White coat syndrome with hypertension 02/13/2024   Left leg DVT (HCC) 12/27/2023   Acute pulmonary embolism (HCC) 12/26/2023   Benign paroxysmal positional vertigo 12/10/2022   Myeloproliferative neoplasm (HCC) 08/20/2022   Dehydration 08/13/2022   Leukocytosis 08/12/2022   Thrombocytosis 06/08/2022   Tinnitus 06/07/2022   Dyspnea 05/21/2019   Anxiety and depression 10/30/2018   Bilateral sensorineural hearing loss 06/25/2018   Otalgia of both ears 06/18/2018   Dizziness 06/18/2018   Macular degeneration 04/27/2018   Mild aortic regurgitation 04/27/2018   Radiculopathy of leg 07/09/2017   Median nerve injury 06/17/2017   Bilateral temporomandibular joint pain 04/25/2017   Hiatal hernia, moderate  04/23/2016   Erectile dysfunction 05/06/2012   Bladder outlet obstruction 05/06/2012   Hypertension 11/13/2011   Abnormal chest x-ray 11/13/2011   NEPHROLITHIASIS, HX OF 10/14/2010   Prediabetes 10/11/2010   SKIN CANCER, HX OF 10/11/2010   INTENTION TREMOR 09/15/2009   BPH (benign prostatic hyperplasia) 09/15/2009   History of colonic polyps 12/05/2008   Hyperlipidemia 02/18/2008   Esophageal reflux 02/18/2008   INTERSTITIAL CYSTITIS 02/18/2008    Past Surgical History:  Procedure Laterality Date   COLONOSCOPY W/ POLYPECTOMY     and esophageal dilation 01/2008 by Dr.Hayes; bladder stones s/p urethral dilation 10/2007 by Dr. Luster Salters   CYSTOSCOPY     2004/2011; Hospitalized 1997 w/ [facility]   LAPAROSCOPIC CHOLECYSTECTOMY  07/14/11   SEPTOPLASTY     TONSILLECTOMY     UPPER GASTROINTESTINAL ENDOSCOPY  2012   Dr Delilah Fend; hiatal hernia       Home Medications    Prior to Admission medications   Medication Sig Start Date End Date Taking? Authorizing Provider  benzonatate  (TESSALON ) 100 MG capsule Take 1 capsule (100 mg total) by mouth 3 (three) times daily as needed for cough. Do not take with alcohol or while operating or driving heavy machinery 11/29/12  Yes Thena Fireman A, NP  amLODipine  (NORVASC ) 5 MG tablet TAKE 1 TABLET BY MOUTH DAILY 12/19/23   Colene Dauphin, MD  famotidine  (PEPCID ) 20 MG tablet TAKE 1 TABLET BY MOUTH AT  BEDTIME Patient taking differently: Take  20 mg by mouth daily as needed for heartburn or indigestion. 12/19/23   Colene Dauphin, MD  fexofenadine (ALLEGRA) 180 MG tablet Take 180 mg by mouth daily as needed (allergies).    [provider]  fluticasone  (FLONASE ) 50 MCG/ACT nasal spray Place 1 spray into the nose daily.    [provider]  Hydrocortisone-Iodoquinol 1-1 % CREA Apply 1 application  topically daily as needed (rash/itching). 03/04/20   [provider]  hydroxyurea  (HYDREA ) 500 MG capsule Take 1 capsule (500mg ) on Mondays,  Wednesdays and Fridays; take 2 capsules (1000mg ) the rest of the week 11/06/23   Almeda Jacobs, MD  sertraline  (ZOLOFT ) 25 MG tablet Take 1 tablet (25 mg total) by mouth daily. 04/14/24   Colene Dauphin, MD  telmisartan -hydrochlorothiazide  (MICARDIS  HCT) 40-12.5 MG tablet TAKE 1 TABLET BY MOUTH DAILY 08/04/23   Colene Dauphin, MD  XARELTO  20 MG TABS tablet Take 1 tablet (20 mg total) by mouth daily. 04/14/24   Colene Dauphin, MD    Family History Family History  Problem Relation Age of Onset   Transient ischemic attack Father 68   Hypertension Father    Coronary artery disease Father    Stroke Mother 101   Hypertension Brother    Diabetes Neg Hx    Cancer Neg Hx     Social History Social History   Tobacco Use   Smoking status: Former    Current packs/day: 0.00    Types: Cigarettes    Quit date: 08/04/1960    Years since quitting: 63.7   Smokeless tobacco: Never   Tobacco comments:    smoked 1958-1961, up to 2 cigars / day  Vaping Use   Vaping status: Never Used  Substance Use Topics   Alcohol use: Not Currently    Comment:  occasional wine   Drug use: No     Allergies   Nitrofurantoin, Penicillins, Sulfonamide derivatives, Amlodipine , Shellfish allergy, and Tamsulosin   Review of Systems Review of Systems Per HPI  Physical Exam Triage Vital Signs ED Triage Vitals  Encounter Vitals Group     BP 04/22/24 1003 (!) 153/75     Girls Systolic BP Percentile --      Girls Diastolic BP Percentile --      Boys Systolic BP Percentile --      Boys Diastolic BP Percentile --      Pulse Rate 04/22/24 1003 70     Resp 04/22/24 1003 17     Temp 04/22/24 1003 98 F (36.7 C)     Temp Source 04/22/24 1003 Oral     SpO2 04/22/24 1003 95 %     Weight --      Height --      Head Circumference --      Peak Flow --      Pain Score 04/22/24 1004 0     Pain Loc --      Pain Education --      Exclude from Growth Chart --    No data found.  Updated Vital Signs BP (!) 153/75 (BP  Location: Right Arm)   Pulse 70   Temp 98 F (36.7 C) (Oral)   Resp 17   SpO2 95%   Visual Acuity Right Eye Distance:   Left Eye Distance:   Bilateral Distance:    Right Eye Near:   Left Eye Near:    Bilateral Near:     Physical Exam Vitals and nursing note reviewed.  Constitutional:  General: He is not in acute distress.    Appearance: Normal appearance. He is not ill-appearing or toxic-appearing.  HENT:     Head: Normocephalic and atraumatic.     Right Ear: Tympanic membrane, ear canal and external ear normal.     Left Ear: Tympanic membrane, ear canal and external ear normal.     Nose: Congestion present. No rhinorrhea.     Mouth/Throat:     Mouth: Mucous membranes are moist.     Pharynx: Oropharynx is clear. No oropharyngeal exudate or posterior oropharyngeal erythema.   Eyes:     General: No scleral icterus.    Extraocular Movements: Extraocular movements intact.    Cardiovascular:     Rate and Rhythm: Normal rate and regular rhythm.  Pulmonary:     Effort: Pulmonary effort is normal. No respiratory distress.     Breath sounds: Normal breath sounds. No wheezing, rhonchi or rales.     Comments: Patient is talking in complete sentences without accessory muscle use  Musculoskeletal:     Cervical back: Normal range of motion and neck supple.  Lymphadenopathy:     Cervical: No cervical adenopathy.   Skin:    General: Skin is warm and dry.     Coloration: Skin is not jaundiced or pale.     Findings: No erythema or rash.   Neurological:     Mental Status: He is alert and oriented to person, place, and time.   Psychiatric:        Behavior: Behavior is cooperative.      UC Treatments / Results  Labs (all labs ordered are listed, but only abnormal results are displayed) Labs Reviewed - No data to display  EKG   Radiology No results found.  Procedures Procedures (including critical care time)  Medications Ordered in UC Medications - No data to  display  Initial Impression / Assessment and Plan / UC Course  I have reviewed the triage vital signs and the nursing notes.  Pertinent labs & imaging results that were available during my care of the patient were reviewed by me and considered in my medical decision making (see chart for details).   Patient is mildly hypertensive in triage today, otherwise vital signs are normal.  1. Viral URI with cough Vitals and examination are reassuring today Suspect viral etiology COVID-19 testing deferred given length of symptoms Supportive care discussed with patient, start cough suppressant medication ER and return precautions discussed  The patient was given the opportunity to ask questions.  All questions answered to their satisfaction.  The patient is in agreement to this plan.   Final Clinical Impressions(s) / UC Diagnoses   Final diagnoses:  Viral URI with cough     Discharge Instructions      You have a viral upper respiratory infection.  Symptoms should improve over the next week to 10 days.  If you develop chest pain or shortness of breath, go to the emergency room.  Some things that can make you feel better are: - Increased rest - Increasing fluid with water/sugar free electrolytes - Acetaminophen  as needed for fever/pain - Salt water gargling, chloraseptic spray and throat lozenges - OTC guaifenesin (Mucinex) 600 mg twice daily for congestion - Saline sinus flushes or a neti pot - Humidifying the air -Tessalon  Perles every 8 hours as needed for dry cough      ED Prescriptions     Medication Sig Dispense Auth. Provider   benzonatate  (TESSALON ) 100 MG capsule Take 1  capsule (100 mg total) by mouth 3 (three) times daily as needed for cough. Do not take with alcohol or while operating or driving heavy machinery 21 capsule Wilhemena Harbour, NP      PDMP not reviewed this encounter.   Wilhemena Harbour, NP 04/22/24 1055

## 2024-04-22 NOTE — Discharge Instructions (Signed)
 You have a viral upper respiratory infection.  Symptoms should improve over the next week to 10 days.  If you develop chest pain or shortness of breath, go to the emergency room.  Some things that can make you feel better are: - Increased rest - Increasing fluid with water/sugar free electrolytes - Acetaminophen  as needed for fever/pain - Salt water gargling, chloraseptic spray and throat lozenges - OTC guaifenesin (Mucinex) 600 mg twice daily for congestion - Saline sinus flushes or a neti pot - Humidifying the air -Tessalon  Perles every 8 hours as needed for dry cough

## 2024-04-23 ENCOUNTER — Ambulatory Visit: Admitting: Cardiovascular Disease

## 2024-05-11 ENCOUNTER — Telehealth: Payer: Self-pay | Admitting: Family Medicine

## 2024-05-11 MED ORDER — BENZONATATE 200 MG PO CAPS
200.0000 mg | ORAL_CAPSULE | Freq: Three times a day (TID) | ORAL | 0 refills | Status: AC | PRN
Start: 2024-05-11 — End: ?

## 2024-05-11 NOTE — Telephone Encounter (Signed)
 Patient came by the office requesting a refill of his Tessalon .  As a courtesy I will refill at 1 time but he needs to follow-up with his PCP if the cough persists.

## 2024-05-24 DIAGNOSIS — M25562 Pain in left knee: Secondary | ICD-10-CM | POA: Diagnosis not present

## 2024-05-25 ENCOUNTER — Encounter: Payer: Self-pay | Admitting: Hematology and Oncology

## 2024-05-25 ENCOUNTER — Inpatient Hospital Stay: Payer: Medicare Other | Admitting: Hematology and Oncology

## 2024-05-25 ENCOUNTER — Inpatient Hospital Stay: Payer: Medicare Other | Attending: Hematology and Oncology

## 2024-05-25 VITALS — BP 124/70 | HR 63 | Temp 98.3°F | Resp 18 | Ht 70.0 in | Wt 174.2 lb

## 2024-05-25 DIAGNOSIS — D471 Chronic myeloproliferative disease: Secondary | ICD-10-CM

## 2024-05-25 DIAGNOSIS — Z86711 Personal history of pulmonary embolism: Secondary | ICD-10-CM | POA: Diagnosis not present

## 2024-05-25 DIAGNOSIS — Z7901 Long term (current) use of anticoagulants: Secondary | ICD-10-CM | POA: Insufficient documentation

## 2024-05-25 DIAGNOSIS — D649 Anemia, unspecified: Secondary | ICD-10-CM | POA: Diagnosis not present

## 2024-05-25 DIAGNOSIS — I2699 Other pulmonary embolism without acute cor pulmonale: Secondary | ICD-10-CM

## 2024-05-25 LAB — CBC WITH DIFFERENTIAL/PLATELET
Abs Immature Granulocytes: 0.08 K/uL — ABNORMAL HIGH (ref 0.00–0.07)
Basophils Absolute: 0.1 K/uL (ref 0.0–0.1)
Basophils Relative: 1 %
Eosinophils Absolute: 0.2 K/uL (ref 0.0–0.5)
Eosinophils Relative: 2 %
HCT: 37.3 % — ABNORMAL LOW (ref 39.0–52.0)
Hemoglobin: 12.8 g/dL — ABNORMAL LOW (ref 13.0–17.0)
Immature Granulocytes: 1 %
Lymphocytes Relative: 19 %
Lymphs Abs: 1.8 K/uL (ref 0.7–4.0)
MCH: 37.4 pg — ABNORMAL HIGH (ref 26.0–34.0)
MCHC: 34.3 g/dL (ref 30.0–36.0)
MCV: 109.1 fL — ABNORMAL HIGH (ref 80.0–100.0)
Monocytes Absolute: 0.9 K/uL (ref 0.1–1.0)
Monocytes Relative: 9 %
Neutro Abs: 6.4 K/uL (ref 1.7–7.7)
Neutrophils Relative %: 68 %
Platelets: 388 K/uL (ref 150–400)
RBC: 3.42 MIL/uL — ABNORMAL LOW (ref 4.22–5.81)
RDW: 13.3 % (ref 11.5–15.5)
WBC: 9.4 K/uL (ref 4.0–10.5)
nRBC: 0 % (ref 0.0–0.2)

## 2024-05-25 MED ORDER — HYDROXYUREA 500 MG PO CAPS: ORAL_CAPSULE | ORAL | Status: DC

## 2024-05-25 NOTE — Assessment & Plan Note (Addendum)
 I have reviewed his CBC He has slight worsening anemia I recommend medication changes He will take hydroxyurea  at 1000 mg on Mondays, Wednesdays and Fridays and to take 500 mg the rest of the week I will see him again in 3 months for further follow-up He will continue anticoagulation therapy for DVT prophylaxis

## 2024-05-25 NOTE — Progress Notes (Signed)
 [facility] OFFICE PROGRESS NOTE  Patient Care Team: Geofm Glade PARAS, MD as PCP - General (Internal Medicine) O'Neal, Darryle Ned, MD as PCP - Cardiology (Cardiology) Dyane Rush, MD (Inactive) as Consulting Physician (Gastroenterology) Fate Morna SAILOR, [facility] (Inactive) as Pharmacist (Pharmacist) Octavia Bruckner, MD as Consulting Physician (Ophthalmology)  Assessment & Plan Myeloproliferative neoplasm [facility]) I have reviewed his CBC He has slight worsening anemia I recommend medication changes He will take hydroxyurea  at 1000 mg on Mondays, Wednesdays and Fridays and to take 500 mg the rest of the week I will see him again in 3 months for further follow-up He will continue anticoagulation therapy for DVT prophylaxis Acute pulmonary embolism without acute cor pulmonale, unspecified pulmonary embolism type (HCC) The patient leads a very active lifestyle The only risk factor I could identify as a potential cause of his recent pulmonary emboli is related to reduced oral fluid intake I have discussed this extensively several months ago Today, I reinforced importance of increasing oral fluid intake as tolerated Due to moderate clot burden seen on his CT imaging from February 2025, I recommend minimum 1 years of anticoagulation therapy and he will continue Xarelto   No orders of the defined types were placed in this encounter.    Almarie Bedford, MD  INTERVAL HISTORY: he returns for surveillance follow-up for myeloproliferative disorder/neoplasm and recent diagnosis of PE  he is here accompanied by his wife He had recent viral illness several months ago has resolved Patient denies recent bleeding such as epistaxis, hematuria or hematochezia We reviewed medication list and discussed medication changes We discussed test results and future plan of care as outlined above  PHYSICAL EXAMINATION: ECOG PERFORMANCE STATUS: 0 - Asymptomatic  Vitals:   05/25/24 1059  BP: 124/70   Pulse: 63  Resp: 18  Temp: 98.3 F (36.8 C)  SpO2: 99%   Lab Results  Component Value Date   WBC 9.4 05/25/2024   HGB 12.8 (L) 05/25/2024   HCT 37.3 (L) 05/25/2024   MCV 109.1 (H) 05/25/2024   PLT 388 05/25/2024

## 2024-05-25 NOTE — Assessment & Plan Note (Addendum)
 The patient leads a very active lifestyle The only risk factor I could identify as a potential cause of his recent pulmonary emboli is related to reduced oral fluid intake I have discussed this extensively several months ago Today, I reinforced importance of increasing oral fluid intake as tolerated Due to moderate clot burden seen on his CT imaging from February 2025, I recommend minimum 1 years of anticoagulation therapy and he will continue Xarelto 

## 2024-07-20 DIAGNOSIS — M25562 Pain in left knee: Secondary | ICD-10-CM | POA: Diagnosis not present

## 2024-07-28 ENCOUNTER — Other Ambulatory Visit: Payer: Self-pay | Admitting: *Deleted

## 2024-07-28 MED ORDER — XARELTO 20 MG PO TABS: 20.0000 mg | ORAL_TABLET | Freq: Every day | ORAL | Status: DC

## 2024-07-29 ENCOUNTER — Other Ambulatory Visit: Payer: Self-pay | Admitting: Hematology and Oncology

## 2024-07-29 ENCOUNTER — Other Ambulatory Visit: Payer: Self-pay | Admitting: Internal Medicine

## 2024-07-29 DIAGNOSIS — I10 Essential (primary) hypertension: Secondary | ICD-10-CM

## 2024-08-02 ENCOUNTER — Other Ambulatory Visit: Payer: Self-pay

## 2024-08-02 ENCOUNTER — Telehealth: Payer: Self-pay

## 2024-08-02 DIAGNOSIS — I2699 Other pulmonary embolism without acute cor pulmonale: Secondary | ICD-10-CM

## 2024-08-02 MED ORDER — XARELTO 20 MG PO TABS: 20.0000 mg | ORAL_TABLET | Freq: Every day | ORAL | 0 refills | Status: DC

## 2024-08-02 MED ORDER — XARELTO 20 MG PO TABS: 20.0000 mg | ORAL_TABLET | Freq: Every day | ORAL | Status: DC

## 2024-08-02 NOTE — Telephone Encounter (Signed)
 T/C from pt's wife stating his prescription from 9/17 for Xarelto  did not go through.  Rx re sent to [facility] [address] per her request.

## 2024-08-17 ENCOUNTER — Telehealth: Payer: Self-pay

## 2024-08-17 ENCOUNTER — Other Ambulatory Visit: Payer: Self-pay

## 2024-08-17 DIAGNOSIS — I2699 Other pulmonary embolism without acute cor pulmonale: Secondary | ICD-10-CM

## 2024-08-17 MED ORDER — XARELTO 20 MG PO TABS: 20.0000 mg | ORAL_TABLET | Freq: Every day | ORAL | 1 refills | Status: DC

## 2024-08-17 NOTE — Telephone Encounter (Signed)
 Returned call to wife. She needs Xarelto  Rx sent to [facility] for 90 day refill. Rx sent.

## 2024-08-26 ENCOUNTER — Inpatient Hospital Stay: Attending: Hematology and Oncology

## 2024-08-26 ENCOUNTER — Encounter: Payer: Self-pay | Admitting: Hematology and Oncology

## 2024-08-26 ENCOUNTER — Inpatient Hospital Stay: Admitting: Hematology and Oncology

## 2024-08-26 VITALS — BP 133/74 | HR 68 | Temp 98.0°F | Resp 18 | Ht 70.0 in | Wt 174.8 lb

## 2024-08-26 DIAGNOSIS — Z7901 Long term (current) use of anticoagulants: Secondary | ICD-10-CM | POA: Diagnosis not present

## 2024-08-26 DIAGNOSIS — D471 Chronic myeloproliferative disease: Secondary | ICD-10-CM | POA: Insufficient documentation

## 2024-08-26 DIAGNOSIS — Z86711 Personal history of pulmonary embolism: Secondary | ICD-10-CM | POA: Diagnosis not present

## 2024-08-26 DIAGNOSIS — I2699 Other pulmonary embolism without acute cor pulmonale: Secondary | ICD-10-CM

## 2024-08-26 DIAGNOSIS — D649 Anemia, unspecified: Secondary | ICD-10-CM | POA: Insufficient documentation

## 2024-08-26 LAB — CBC WITH DIFFERENTIAL/PLATELET
Abs Immature Granulocytes: 0.11 K/uL — ABNORMAL HIGH (ref 0.00–0.07)
Basophils Absolute: 0.2 K/uL — ABNORMAL HIGH (ref 0.0–0.1)
Basophils Relative: 1 %
Eosinophils Absolute: 0.3 K/uL (ref 0.0–0.5)
Eosinophils Relative: 3 %
HCT: 41 % (ref 39.0–52.0)
Hemoglobin: 13.9 g/dL (ref 13.0–17.0)
Immature Granulocytes: 1 %
Lymphocytes Relative: 24 %
Lymphs Abs: 2.5 K/uL (ref 0.7–4.0)
MCH: 36.4 pg — ABNORMAL HIGH (ref 26.0–34.0)
MCHC: 33.9 g/dL (ref 30.0–36.0)
MCV: 107.3 fL — ABNORMAL HIGH (ref 80.0–100.0)
Monocytes Absolute: 1.1 K/uL — ABNORMAL HIGH (ref 0.1–1.0)
Monocytes Relative: 10 %
Neutro Abs: 6.2 K/uL (ref 1.7–7.7)
Neutrophils Relative %: 61 %
Platelets: 412 K/uL — ABNORMAL HIGH (ref 150–400)
RBC: 3.82 MIL/uL — ABNORMAL LOW (ref 4.22–5.81)
RDW: 13 % (ref 11.5–15.5)
WBC: 10.4 K/uL (ref 4.0–10.5)
nRBC: 0 % (ref 0.0–0.2)

## 2024-08-26 NOTE — Progress Notes (Signed)
 [facility] OFFICE PROGRESS NOTE  Patient Care Team: Geofm Glade PARAS, MD as PCP - General (Internal Medicine) O'Neal, Darryle Ned, MD as PCP - Cardiology (Cardiology) Dyane Rush, MD (Inactive) as Consulting Physician (Gastroenterology) Fate Morna SAILOR, [facility] (Inactive) as Pharmacist (Pharmacist) Octavia Bruckner, MD as Consulting Physician (Ophthalmology)  Assessment & Plan Myeloproliferative neoplasm [facility]) The patient was noted to have abnormal CBC since 2022. On August 13, 2022, molecular testing came back positive for  CAL-R; overall, the patient has myeloproliferative disorder, likely essential thrombocytosis On August 22, 2022, the patient is started on hydroxyurea   I have reviewed his CBC today He has normal white count and hemoglobin, platelet count marginally elevated I do not plan any dose changes He will take hydroxyurea  at 1000 mg on Mondays, Wednesdays and Fridays and to take 500 mg the rest of the week I will see him again in 3 months for further follow-up He will continue anticoagulation therapy for DVT prophylaxis Acute pulmonary embolism without acute cor pulmonale, unspecified pulmonary embolism type [facility]) The patient leads a very active lifestyle The only risk factor I could identify as a potential cause of his diagnosis of lower extremity DVT and pulmonary emboli is related to reduced oral fluid intake Due to moderate clot burden seen on his CT imaging from February 2025, I recommend minimum 1 years of anticoagulation therapy and he will continue Xarelto  until mid February 2026  No orders of the defined types were placed in this encounter.    Almarie Bedford, MD  INTERVAL HISTORY: he returns for surveillance follow-up for myeloproliferative disorder/neoplasm He denies recent missed doses Patient denies recent bleeding such as epistaxis, hematuria or hematochezia No recent infection We reviewed medication list and discussed medication changes We  discussed test results and future plan of care as outlined above  PHYSICAL EXAMINATION: ECOG PERFORMANCE STATUS: 0 - Asymptomatic  Vitals:   08/26/24 1100  BP: 133/74  Pulse: 68  Resp: 18  Temp: 98 F (36.7 C)  SpO2: 99%   Lab Results  Component Value Date   WBC 10.4 08/26/2024   HGB 13.9 08/26/2024   HCT 41.0 08/26/2024   MCV 107.3 (H) 08/26/2024   PLT 412 (H) 08/26/2024

## 2024-08-26 NOTE — Assessment & Plan Note (Addendum)
 The patient was noted to have abnormal CBC since 2022. On August 13, 2022, molecular testing came back positive for  CAL-R; overall, the patient has myeloproliferative disorder, likely essential thrombocytosis On August 22, 2022, the patient is started on hydroxyurea   I have reviewed his CBC today He has normal white count and hemoglobin, platelet count marginally elevated I do not plan any dose changes He will take hydroxyurea  at 1000 mg on Mondays, Wednesdays and Fridays and to take 500 mg the rest of the week I will see him again in 3 months for further follow-up He will continue anticoagulation therapy for DVT prophylaxis

## 2024-08-26 NOTE — Assessment & Plan Note (Addendum)
 The patient leads a very active lifestyle The only risk factor I could identify as a potential cause of his diagnosis of lower extremity DVT and pulmonary emboli is related to reduced oral fluid intake Due to moderate clot burden seen on his CT imaging from February 2025, I recommend minimum 1 years of anticoagulation therapy and he will continue Xarelto  until mid February 2026

## 2024-09-02 DIAGNOSIS — D225 Melanocytic nevi of trunk: Secondary | ICD-10-CM | POA: Diagnosis not present

## 2024-09-02 DIAGNOSIS — Z85828 Personal history of other malignant neoplasm of skin: Secondary | ICD-10-CM | POA: Diagnosis not present

## 2024-09-02 DIAGNOSIS — D2261 Melanocytic nevi of right upper limb, including shoulder: Secondary | ICD-10-CM | POA: Diagnosis not present

## 2024-09-02 DIAGNOSIS — L905 Scar conditions and fibrosis of skin: Secondary | ICD-10-CM | POA: Diagnosis not present

## 2024-09-02 DIAGNOSIS — L821 Other seborrheic keratosis: Secondary | ICD-10-CM | POA: Diagnosis not present

## 2024-09-02 DIAGNOSIS — L570 Actinic keratosis: Secondary | ICD-10-CM | POA: Diagnosis not present

## 2024-09-02 DIAGNOSIS — D2262 Melanocytic nevi of left upper limb, including shoulder: Secondary | ICD-10-CM | POA: Diagnosis not present

## 2024-09-02 DIAGNOSIS — D692 Other nonthrombocytopenic purpura: Secondary | ICD-10-CM | POA: Diagnosis not present

## 2024-09-02 DIAGNOSIS — D224 Melanocytic nevi of scalp and neck: Secondary | ICD-10-CM | POA: Diagnosis not present

## 2024-10-18 ENCOUNTER — Other Ambulatory Visit: Payer: Self-pay | Admitting: Hematology and Oncology

## 2024-10-18 DIAGNOSIS — I2699 Other pulmonary embolism without acute cor pulmonale: Secondary | ICD-10-CM

## 2024-11-29 ENCOUNTER — Other Ambulatory Visit: Payer: Self-pay

## 2024-11-29 DIAGNOSIS — I2699 Other pulmonary embolism without acute cor pulmonale: Secondary | ICD-10-CM

## 2024-11-29 DIAGNOSIS — D471 Chronic myeloproliferative disease: Secondary | ICD-10-CM

## 2024-11-30 ENCOUNTER — Encounter: Payer: Self-pay | Admitting: Hematology and Oncology

## 2024-11-30 ENCOUNTER — Other Ambulatory Visit: Payer: Self-pay | Admitting: Hematology and Oncology

## 2024-11-30 ENCOUNTER — Inpatient Hospital Stay: Admitting: Hematology and Oncology

## 2024-11-30 ENCOUNTER — Inpatient Hospital Stay: Attending: Hematology and Oncology

## 2024-11-30 VITALS — BP 152/77 | HR 65 | Temp 97.7°F | Resp 18 | Ht 70.0 in | Wt 178.6 lb

## 2024-11-30 DIAGNOSIS — Z86718 Personal history of other venous thrombosis and embolism: Secondary | ICD-10-CM | POA: Diagnosis not present

## 2024-11-30 DIAGNOSIS — I2699 Other pulmonary embolism without acute cor pulmonale: Secondary | ICD-10-CM

## 2024-11-30 DIAGNOSIS — Z7901 Long term (current) use of anticoagulants: Secondary | ICD-10-CM | POA: Diagnosis not present

## 2024-11-30 DIAGNOSIS — D471 Chronic myeloproliferative disease: Secondary | ICD-10-CM | POA: Diagnosis not present

## 2024-11-30 LAB — CBC WITH DIFFERENTIAL (CANCER CENTER ONLY)
Abs Immature Granulocytes: 0.11 K/uL — ABNORMAL HIGH (ref 0.00–0.07)
Basophils Absolute: 0.2 K/uL — ABNORMAL HIGH (ref 0.0–0.1)
Basophils Relative: 2 %
Eosinophils Absolute: 0.3 K/uL (ref 0.0–0.5)
Eosinophils Relative: 4 %
HCT: 39.6 % (ref 39.0–52.0)
Hemoglobin: 13.6 g/dL (ref 13.0–17.0)
Immature Granulocytes: 1 %
Lymphocytes Relative: 26 %
Lymphs Abs: 2.3 K/uL (ref 0.7–4.0)
MCH: 36.8 pg — ABNORMAL HIGH (ref 26.0–34.0)
MCHC: 34.3 g/dL (ref 30.0–36.0)
MCV: 107 fL — ABNORMAL HIGH (ref 80.0–100.0)
Monocytes Absolute: 0.9 K/uL (ref 0.1–1.0)
Monocytes Relative: 10 %
Neutro Abs: 5.1 K/uL (ref 1.7–7.7)
Neutrophils Relative %: 57 %
Platelet Count: 418 K/uL — ABNORMAL HIGH (ref 150–400)
RBC: 3.7 MIL/uL — ABNORMAL LOW (ref 4.22–5.81)
RDW: 13.3 % (ref 11.5–15.5)
WBC Count: 8.9 K/uL (ref 4.0–10.5)
nRBC: 0 % (ref 0.0–0.2)

## 2024-11-30 MED ORDER — HYDROXYUREA 500 MG PO CAPS: ORAL_CAPSULE | ORAL | 3 refills | Status: AC

## 2024-11-30 MED ORDER — HYDROXYUREA 500 MG PO CAPS: ORAL_CAPSULE | ORAL | Status: DC

## 2024-11-30 NOTE — Assessment & Plan Note (Addendum)
 The patient was noted to have abnormal CBC since 2022. On August 13, 2022, molecular testing came back positive for  CAL-R; overall, the patient has myeloproliferative disorder, likely essential thrombocytosis On August 22, 2022, the patient is started on hydroxyurea   I have reviewed his CBC today He has normal white count and hemoglobin, platelet count marginally elevated I recommend myeloid dose changes He will take hydroxyurea  at 1000 mg on Mondays, and Fridays and to take 500 mg the rest of the week I plan to see him in 2 months for further follow-up

## 2024-11-30 NOTE — Progress Notes (Signed)
 [facility] OFFICE PROGRESS NOTE  Patient Care Team: Geofm Glade PARAS, MD as PCP - General (Internal Medicine) O'Neal, Darryle Ned, MD as PCP - Cardiology (Cardiology) Dyane Rush, MD (Inactive) as Consulting Physician (Gastroenterology) Fate Morna SAILOR, [facility] (Inactive) as Pharmacist (Pharmacist) Octavia Bruckner, MD as Consulting Physician (Ophthalmology)  Assessment & Plan Myeloproliferative neoplasm [facility]) The patient was noted to have abnormal CBC since 2022. On August 13, 2022, molecular testing came back positive for  CAL-R; overall, the patient has myeloproliferative disorder, likely essential thrombocytosis On August 22, 2022, the patient is started on hydroxyurea   I have reviewed his CBC today He has normal white count and hemoglobin, platelet count marginally elevated I recommend myeloid dose changes He will take hydroxyurea  at 1000 mg on Mondays, and Fridays and to take 500 mg the rest of the week I plan to see him in 2 months for further follow-up Acute pulmonary embolism without acute cor pulmonale, unspecified pulmonary embolism type [facility]) The patient leads a very active lifestyle The only risk factor I could identify as a potential cause of his diagnosis of lower extremity DVT and pulmonary emboli is related to reduced oral fluid intake Due to moderate clot burden seen on his CT imaging from February 2025, I recommend minimum 1 years of anticoagulation therapy and he will continue Xarelto  until his next visit  Orders Placed This Encounter  Procedures   CBC with Differential/Platelet    Standing Status:   Standing    Number of Occurrences:   22    Expiration Date:   11/30/2025     Almarie Bedford, MD  INTERVAL HISTORY: he returns for surveillance follow-up for myeloproliferative disorder/neoplasm and history of DVT with PE Patient denies recent bleeding such as epistaxis, hematuria or hematochezia No recent infection We reviewed medication list and  discussed medication changes We discussed test results and future plan of care as outlined above  PHYSICAL EXAMINATION: ECOG PERFORMANCE STATUS: 0 - Asymptomatic  Vitals:   11/30/24 1019  BP: (!) 152/77  Pulse: 65  Resp: 18  Temp: 97.7 F (36.5 C)  SpO2: 99%   Lab Results  Component Value Date   WBC 8.9 11/30/2024   HGB 13.6 11/30/2024   HCT 39.6 11/30/2024   MCV 107.0 (H) 11/30/2024   PLT 418 (H) 11/30/2024

## 2024-11-30 NOTE — Assessment & Plan Note (Addendum)
 The patient leads a very active lifestyle The only risk factor I could identify as a potential cause of his diagnosis of lower extremity DVT and pulmonary emboli is related to reduced oral fluid intake Due to moderate clot burden seen on his CT imaging from February 2025, I recommend minimum 1 years of anticoagulation therapy and he will continue Xarelto  until his next visit

## 2024-12-17 ENCOUNTER — Other Ambulatory Visit: Payer: Self-pay

## 2024-12-17 DIAGNOSIS — M5410 Radiculopathy, site unspecified: Secondary | ICD-10-CM

## 2025-01-28 ENCOUNTER — Inpatient Hospital Stay: Admitting: Hematology and Oncology

## 2025-01-28 ENCOUNTER — Inpatient Hospital Stay: Attending: Hematology and Oncology

## 2025-04-11 ENCOUNTER — Ambulatory Visit
